# Patient Record
Sex: Female | Born: 1980 | Race: Black or African American | Hispanic: No | Marital: Married | Smoking: Never smoker
Health system: Southern US, Community
[De-identification: ages and names within clinical notes are randomized; demographics above are authoritative.]

## PROBLEM LIST (undated history)

## (undated) DIAGNOSIS — Z8759 Personal history of other complications of pregnancy, childbirth and the puerperium: Secondary | ICD-10-CM

## (undated) DIAGNOSIS — F32A Depression, unspecified: Secondary | ICD-10-CM

## (undated) DIAGNOSIS — D219 Benign neoplasm of connective and other soft tissue, unspecified: Secondary | ICD-10-CM

## (undated) DIAGNOSIS — G43909 Migraine, unspecified, not intractable, without status migrainosus: Secondary | ICD-10-CM

## (undated) DIAGNOSIS — I10 Essential (primary) hypertension: Secondary | ICD-10-CM

## (undated) DIAGNOSIS — Z98891 History of uterine scar from previous surgery: Secondary | ICD-10-CM

## (undated) DIAGNOSIS — F329 Major depressive disorder, single episode, unspecified: Secondary | ICD-10-CM

## (undated) DIAGNOSIS — F419 Anxiety disorder, unspecified: Secondary | ICD-10-CM

## (undated) DIAGNOSIS — O1490 Unspecified pre-eclampsia, unspecified trimester: Secondary | ICD-10-CM

## (undated) DIAGNOSIS — K8020 Calculus of gallbladder without cholecystitis without obstruction: Secondary | ICD-10-CM

## (undated) DIAGNOSIS — E559 Vitamin D deficiency, unspecified: Secondary | ICD-10-CM

## (undated) HISTORY — DX: Essential (primary) hypertension: I10

## (undated) HISTORY — PX: NASAL SEPTUM SURGERY: SHX37

## (undated) HISTORY — DX: Calculus of gallbladder without cholecystitis without obstruction: K80.20

## (undated) HISTORY — DX: Unspecified pre-eclampsia, unspecified trimester: O14.90

## (undated) HISTORY — DX: Vitamin D deficiency, unspecified: E55.9

## (undated) HISTORY — DX: Personal history of other complications of pregnancy, childbirth and the puerperium: Z87.59

---
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
## 2002-04-24 HISTORY — PX: OTHER SURGICAL HISTORY: SHX169

## 2003-03-25 HISTORY — PX: NASAL SEPTUM SURGERY: SHX37

## 2003-11-12 ENCOUNTER — Other Ambulatory Visit: Admission: RE | Admit: 2003-11-12 | Discharge: 2003-11-12 | Payer: Self-pay | Admitting: Obstetrics and Gynecology

## 2004-10-05 ENCOUNTER — Ambulatory Visit: Payer: Self-pay | Admitting: Gastroenterology

## 2005-01-24 ENCOUNTER — Other Ambulatory Visit: Admission: RE | Admit: 2005-01-24 | Discharge: 2005-01-24 | Payer: Self-pay | Admitting: Obstetrics and Gynecology

## 2005-11-24 ENCOUNTER — Emergency Department (HOSPITAL_COMMUNITY): Admission: EM | Admit: 2005-11-24 | Discharge: 2005-11-24 | Payer: Self-pay | Admitting: Family Medicine

## 2007-08-20 ENCOUNTER — Encounter: Admission: RE | Admit: 2007-08-20 | Discharge: 2007-08-20 | Payer: Self-pay | Admitting: Family Medicine

## 2012-11-26 LAB — OB RESULTS CONSOLE GC/CHLAMYDIA
Chlamydia: NEGATIVE
Gonorrhea: NEGATIVE

## 2012-11-26 LAB — OB RESULTS CONSOLE RUBELLA ANTIBODY, IGM: Rubella: IMMUNE

## 2012-11-26 LAB — OB RESULTS CONSOLE ABO/RH: RH Type: POSITIVE

## 2012-11-26 LAB — OB RESULTS CONSOLE ANTIBODY SCREEN: Antibody Screen: NEGATIVE

## 2012-11-26 LAB — OB RESULTS CONSOLE HIV ANTIBODY (ROUTINE TESTING): HIV: NONREACTIVE

## 2012-11-26 LAB — OB RESULTS CONSOLE RPR: RPR: NONREACTIVE

## 2012-11-26 LAB — OB RESULTS CONSOLE HEPATITIS B SURFACE ANTIGEN: Hepatitis B Surface Ag: NEGATIVE

## 2013-04-24 DIAGNOSIS — I10 Essential (primary) hypertension: Secondary | ICD-10-CM

## 2013-04-24 HISTORY — DX: Essential (primary) hypertension: I10

## 2013-04-24 NOTE — L&D Delivery Note (Signed)
Delivery Note Pt reached complete dilation and pushed great for 45 minutes.  At 8:13 PM a healthy female was delivered via Vaginal, Spontaneous Delivery (Presentation: Right Occiput Anterior).  APGAR: 9, 9; weight pending .   Placenta status: Intact, Spontaneous.  Cord: 3 vessels with the following complications: None.    Anesthesia: Epidural  Episiotomy: None Lacerations: First degree vaginal Suture Repair: 3.0 vicryl Est. Blood Loss (mL): 400cc  Mom to postpartum.  Baby to Couplet care / Skin to Skin. D/w pt and husband circumcision and they desire to proceed. Will watch pt BP, elevated with pushing to 170's/90-100 and if stay at that level will plan for magnesium postpartum.  Logan Bores 05/29/2013, 8:46 PM

## 2013-05-15 LAB — OB RESULTS CONSOLE GBS: GBS: NEGATIVE

## 2013-05-26 ENCOUNTER — Telehealth (HOSPITAL_COMMUNITY): Payer: Self-pay | Admitting: *Deleted

## 2013-05-26 ENCOUNTER — Encounter (HOSPITAL_COMMUNITY): Payer: Self-pay | Admitting: *Deleted

## 2013-05-26 NOTE — Telephone Encounter (Signed)
Preadmission screen  

## 2013-05-28 ENCOUNTER — Encounter (HOSPITAL_COMMUNITY): Payer: Self-pay

## 2013-05-28 ENCOUNTER — Inpatient Hospital Stay (HOSPITAL_COMMUNITY)
Admission: RE | Admit: 2013-05-28 | Discharge: 2013-06-01 | DRG: 774 | Disposition: A | Discharge status: Home / Self Care | Payer: No Typology Code available for payment source | Source: Ambulatory Visit | Attending: Obstetrics and Gynecology | Admitting: Obstetrics and Gynecology

## 2013-05-28 DIAGNOSIS — O99214 Obesity complicating childbirth: Secondary | ICD-10-CM

## 2013-05-28 DIAGNOSIS — IMO0002 Reserved for concepts with insufficient information to code with codable children: Principal | ICD-10-CM | POA: Diagnosis present

## 2013-05-28 DIAGNOSIS — Z6841 Body Mass Index (BMI) 40.0 and over, adult: Secondary | ICD-10-CM

## 2013-05-28 DIAGNOSIS — E669 Obesity, unspecified: Secondary | ICD-10-CM | POA: Diagnosis present

## 2013-05-28 DIAGNOSIS — O1490 Unspecified pre-eclampsia, unspecified trimester: Secondary | ICD-10-CM

## 2013-05-28 HISTORY — DX: Migraine, unspecified, not intractable, without status migrainosus: G43.909

## 2013-05-28 HISTORY — DX: Unspecified pre-eclampsia, unspecified trimester: O14.90

## 2013-05-28 LAB — URINALYSIS, ROUTINE W REFLEX MICROSCOPIC
Bilirubin Urine: NEGATIVE
Glucose, UA: NEGATIVE mg/dL
Ketones, ur: NEGATIVE mg/dL
Leukocytes, UA: NEGATIVE
Nitrite: NEGATIVE
PH: 6.5 (ref 5.0–8.0)
Protein, ur: >300 mg/dL — AB
SPECIFIC GRAVITY, URINE: 1.02 (ref 1.005–1.030)
UROBILINOGEN UA: 0.2 mg/dL (ref 0.0–1.0)

## 2013-05-28 LAB — CBC
HCT: 33.8 % — ABNORMAL LOW (ref 36.0–46.0)
Hemoglobin: 11.7 g/dL — ABNORMAL LOW (ref 12.0–15.0)
MCH: 30.4 pg (ref 26.0–34.0)
MCHC: 34.6 g/dL (ref 30.0–36.0)
MCV: 87.8 fL (ref 78.0–100.0)
PLATELETS: 146 10*3/uL — AB (ref 150–400)
RBC: 3.85 MIL/uL — AB (ref 3.87–5.11)
RDW: 13.6 % (ref 11.5–15.5)
WBC: 11.3 10*3/uL — AB (ref 4.0–10.5)

## 2013-05-28 LAB — COMPREHENSIVE METABOLIC PANEL
ALBUMIN: 2.5 g/dL — AB (ref 3.5–5.2)
ALK PHOS: 85 U/L (ref 39–117)
ALT: 22 U/L (ref 0–35)
AST: 22 U/L (ref 0–37)
BUN: 7 mg/dL (ref 6–23)
CO2: 21 mEq/L (ref 19–32)
Calcium: 8.9 mg/dL (ref 8.4–10.5)
Chloride: 105 mEq/L (ref 96–112)
Creatinine, Ser: 0.61 mg/dL (ref 0.50–1.10)
GFR calc Af Amer: >90 mL/min (ref >90)
GFR calc non Af Amer: >90 mL/min (ref >90)
Glucose, Bld: 107 mg/dL — ABNORMAL HIGH (ref 70–99)
POTASSIUM: 4 meq/L (ref 3.7–5.3)
SODIUM: 139 meq/L (ref 137–147)
Total Protein: 5.8 g/dL — ABNORMAL LOW (ref 6.0–8.3)

## 2013-05-28 LAB — URIC ACID: Uric Acid, Serum: 6.1 mg/dL (ref 2.4–7.0)

## 2013-05-28 LAB — URINE MICROSCOPIC-ADD ON

## 2013-05-28 MED ORDER — TERBUTALINE SULFATE 1 MG/ML IJ SOLN: 0.2500 mg | Freq: Once | INTRAMUSCULAR | Status: AC | PRN

## 2013-05-28 MED ORDER — OXYTOCIN 40 UNITS IN LACTATED RINGERS INFUSION - SIMPLE MED
1.0000 m[IU]/min | INTRAVENOUS | Status: DC
  Administered 2013-05-29: 3 m[IU]/min via INTRAVENOUS
  Administered 2013-05-29: 4 m[IU]/min via INTRAVENOUS
  Administered 2013-05-29: 2 m[IU]/min via INTRAVENOUS
  Filled 2013-05-28: qty 1000

## 2013-05-28 MED ORDER — ACETAMINOPHEN 325 MG PO TABS: 650.0000 mg | ORAL_TABLET | ORAL | Status: DC | PRN

## 2013-05-28 MED ORDER — LACTATED RINGERS IV SOLN
INTRAVENOUS | Status: DC
  Administered 2013-05-28 – 2013-05-29 (×2): via INTRAVENOUS

## 2013-05-28 MED ORDER — ZOLPIDEM TARTRATE 5 MG PO TABS
5.0000 mg | ORAL_TABLET | Freq: Every evening | ORAL | Status: DC | PRN
  Administered 2013-05-28: 5 mg via ORAL
  Filled 2013-05-28: qty 1

## 2013-05-28 MED ORDER — IBUPROFEN 600 MG PO TABS
600.0000 mg | ORAL_TABLET | Freq: Four times a day (QID) | ORAL | Status: DC | PRN
  Administered 2013-05-29: 600 mg via ORAL
  Filled 2013-05-28: qty 1

## 2013-05-28 MED ORDER — CITRIC ACID-SODIUM CITRATE 334-500 MG/5ML PO SOLN: 30.0000 mL | ORAL | Status: DC | PRN

## 2013-05-28 MED ORDER — ONDANSETRON HCL 4 MG/2ML IJ SOLN: 4.0000 mg | Freq: Four times a day (QID) | INTRAMUSCULAR | Status: DC | PRN

## 2013-05-28 MED ORDER — MISOPROSTOL 25 MCG QUARTER TABLET
25.0000 ug | ORAL_TABLET | ORAL | Status: DC | PRN
  Administered 2013-05-28 – 2013-05-29 (×2): 25 ug via VAGINAL
  Filled 2013-05-28 (×2): qty 0.25

## 2013-05-28 MED ORDER — OXYTOCIN 40 UNITS IN LACTATED RINGERS INFUSION - SIMPLE MED: 62.5000 mL/h | INTRAVENOUS | Status: DC

## 2013-05-28 MED ORDER — OXYCODONE-ACETAMINOPHEN 5-325 MG PO TABS: 1.0000 | ORAL_TABLET | ORAL | Status: DC | PRN

## 2013-05-28 MED ORDER — BUTORPHANOL TARTRATE 1 MG/ML IJ SOLN
1.0000 mg | INTRAMUSCULAR | Status: DC | PRN
  Administered 2013-05-29 (×3): 1 mg via INTRAVENOUS
  Filled 2013-05-28 (×3): qty 1

## 2013-05-28 MED ORDER — LACTATED RINGERS IV SOLN: 500.0000 mL | INTRAVENOUS | Status: DC | PRN

## 2013-05-28 MED ORDER — OXYTOCIN BOLUS FROM INFUSION
500.0000 mL | INTRAVENOUS | Status: DC
  Administered 2013-05-29: 500 mL via INTRAVENOUS

## 2013-05-28 MED ORDER — LIDOCAINE HCL (PF) 1 % IJ SOLN
30.0000 mL | INTRAMUSCULAR | Status: DC | PRN
  Filled 2013-05-28: qty 30

## 2013-05-28 NOTE — H&P (Signed)
Kimberly Howard is a 33 y.o. female G2P1001 at 39 weeks (EDD 06/18/13 by a 10 week [place]) presenting for induction due to preeclampsia.  The pt has had increasing BP for several weeks running 140-150/90's and recent proteinuria with a 24 hour urine returning with 911mg  of protein in 24 hours.  PIH labs are WNL.  She has not had any HA or abdominal pain, just feeling tired and weak.  Prenatal care has been otherwise uneventful.   Maternal Medical History:  Contractions: Onset was 3-5 hours ago.   Frequency: irregular.   Perceived severity is mild.    Fetal activity: Perceived fetal activity is normal.    Prenatal complications: PIH.   Prenatal Complications - Diabetes: none.    OB History   Grav Para Term Preterm Abortions TAB SAB Ect Mult Living   2    1  1        SAB x 1   No past medical history on file. Past Surgical History  Procedure Laterality Date  . Nasal septum surgery     Family History: family history is not on file. Social History:  has no tobacco, alcohol, and drug history on file.   Prenatal Transfer Tool  Maternal Diabetes: No Genetic Screening: Normal Maternal Ultrasounds/Referrals: Normal Fetal Ultrasounds or other Referrals:  None Maternal Substance Abuse:  No Significant Maternal Medications:  None Significant Maternal Lab Results:  None Other Comments:  None  Review of Systems  Gastrointestinal: Negative for abdominal pain.  Neurological: Negative for headaches.      Last menstrual period 09/19/2012. Exam Physical Exam  Constitutional: She is oriented to person, place, and time. She appears well-developed and well-nourished.  Cardiovascular: Normal rate and regular rhythm.   Respiratory: Effort normal.  GI: Soft.  Genitourinary: Vagina normal and uterus normal.  Musculoskeletal: She exhibits edema.  Neurological: She is alert and oriented to person, place, and time.  Psychiatric: She has a normal mood and affect.    Prenatal labs: ABO, Rh:  A/Positive/-- (08/05 0000) Antibody: Negative (08/05 0000) Rubella: Immune (08/05 0000) RPR: Nonreactive (08/05 0000)  HBsAg: Negative (08/05 0000)  HIV: Non-reactive (08/05 0000)  GBS: Negative (01/22 0000)  One hour GTT 90 First trimester screen negative Hgb AA  Assessment/Plan: Pt for cervical ripening and IOL for preeclampsia.  She has no severe criteria currently, so will hold off on magnesium unless admission BP or labs warrant that.  Will try cytotec ripening followed by Pitocin.   Logan Bores 05/28/2013, 6:14 PM

## 2013-05-29 ENCOUNTER — Inpatient Hospital Stay (HOSPITAL_COMMUNITY): Payer: No Typology Code available for payment source | Admitting: Anesthesiology

## 2013-05-29 ENCOUNTER — Encounter (HOSPITAL_COMMUNITY): Payer: Self-pay

## 2013-05-29 ENCOUNTER — Encounter (HOSPITAL_COMMUNITY): Payer: No Typology Code available for payment source | Admitting: Anesthesiology

## 2013-05-29 LAB — CBC
HCT: 35.3 % — ABNORMAL LOW (ref 36.0–46.0)
HCT: 32.8 % — ABNORMAL LOW (ref 36.0–46.0)
HEMOGLOBIN: 12.3 g/dL (ref 12.0–15.0)
Hemoglobin: 11.4 g/dL — ABNORMAL LOW (ref 12.0–15.0)
MCH: 30.8 pg (ref 26.0–34.0)
MCH: 31.2 pg (ref 26.0–34.0)
MCHC: 34.8 g/dL (ref 30.0–36.0)
MCHC: 34.8 g/dL (ref 30.0–36.0)
MCV: 88.5 fL (ref 78.0–100.0)
MCV: 89.9 fL (ref 78.0–100.0)
PLATELETS: 141 10*3/uL — AB (ref 150–400)
Platelets: 147 10*3/uL — ABNORMAL LOW (ref 150–400)
RBC: 3.99 MIL/uL (ref 3.87–5.11)
RBC: 3.65 MIL/uL — ABNORMAL LOW (ref 3.87–5.11)
RDW: 13.8 % (ref 11.5–15.5)
RDW: 13.9 % (ref 11.5–15.5)
WBC: 12.8 10*3/uL — ABNORMAL HIGH (ref 4.0–10.5)
WBC: 19.7 10*3/uL — ABNORMAL HIGH (ref 4.0–10.5)

## 2013-05-29 LAB — TYPE AND SCREEN
ABO/RH(D): A POS
ANTIBODY SCREEN: NEGATIVE

## 2013-05-29 LAB — RPR: RPR: NONREACTIVE

## 2013-05-29 LAB — ABO/RH: ABO/RH(D): A POS

## 2013-05-29 MED ORDER — MAGNESIUM SULFATE 40 G IN LACTATED RINGERS - SIMPLE
2.0000 g/h | INTRAVENOUS | Status: DC
  Filled 2013-05-29: qty 500

## 2013-05-29 MED ORDER — TETANUS-DIPHTH-ACELL PERTUSSIS 5-2.5-18.5 LF-MCG/0.5 IM SUSP
0.5000 mL | Freq: Once | INTRAMUSCULAR | Status: DC
  Filled 2013-05-29: qty 0.5

## 2013-05-29 MED ORDER — FENTANYL 2.5 MCG/ML BUPIVACAINE 1/10 % EPIDURAL INFUSION (WH - ANES)
INTRAMUSCULAR | Status: AC
  Administered 2013-05-29: 14 mL/h via EPIDURAL
  Filled 2013-05-29: qty 125

## 2013-05-29 MED ORDER — SODIUM CHLORIDE 0.45 % IV SOLN
INTRAVENOUS | Status: DC
  Administered 2013-05-30: 04:00:00 via INTRAVENOUS

## 2013-05-29 MED ORDER — DIPHENHYDRAMINE HCL 50 MG/ML IJ SOLN: 12.5000 mg | INTRAMUSCULAR | Status: DC | PRN

## 2013-05-29 MED ORDER — ONDANSETRON HCL 4 MG PO TABS: 4.0000 mg | ORAL_TABLET | ORAL | Status: DC | PRN

## 2013-05-29 MED ORDER — PRENATAL MULTIVITAMIN CH
1.0000 | ORAL_TABLET | Freq: Every day | ORAL | Status: DC
  Administered 2013-05-30 – 2013-05-31 (×2): 1 via ORAL
  Filled 2013-05-29 (×2): qty 1

## 2013-05-29 MED ORDER — OXYCODONE-ACETAMINOPHEN 5-325 MG PO TABS
1.0000 | ORAL_TABLET | ORAL | Status: DC | PRN
  Administered 2013-05-30: 1 via ORAL
  Administered 2013-05-30: 2 via ORAL
  Administered 2013-05-31: 1 via ORAL
  Administered 2013-05-31: 2 via ORAL
  Filled 2013-05-29: qty 1
  Filled 2013-05-29: qty 2
  Filled 2013-05-29 (×2): qty 1
  Filled 2013-05-29: qty 2

## 2013-05-29 MED ORDER — IBUPROFEN 600 MG PO TABS
600.0000 mg | ORAL_TABLET | Freq: Four times a day (QID) | ORAL | Status: DC
  Administered 2013-05-30 – 2013-06-01 (×8): 600 mg via ORAL
  Filled 2013-05-29 (×8): qty 1

## 2013-05-29 MED ORDER — DIBUCAINE 1 % RE OINT
1.0000 "application " | TOPICAL_OINTMENT | RECTAL | Status: DC | PRN
  Filled 2013-05-29: qty 28

## 2013-05-29 MED ORDER — PHENYLEPHRINE 40 MCG/ML (10ML) SYRINGE FOR IV PUSH (FOR BLOOD PRESSURE SUPPORT): 80.0000 ug | PREFILLED_SYRINGE | INTRAVENOUS | Status: DC | PRN

## 2013-05-29 MED ORDER — WITCH HAZEL-GLYCERIN EX PADS: 1.0000 "application " | MEDICATED_PAD | CUTANEOUS | Status: DC | PRN

## 2013-05-29 MED ORDER — ONDANSETRON HCL 4 MG/2ML IJ SOLN: 4.0000 mg | INTRAMUSCULAR | Status: DC | PRN

## 2013-05-29 MED ORDER — PHENYLEPHRINE 40 MCG/ML (10ML) SYRINGE FOR IV PUSH (FOR BLOOD PRESSURE SUPPORT)
PREFILLED_SYRINGE | INTRAVENOUS | Status: AC
  Filled 2013-05-29: qty 10

## 2013-05-29 MED ORDER — EPHEDRINE 5 MG/ML INJ: 10.0000 mg | INTRAVENOUS | Status: DC | PRN

## 2013-05-29 MED ORDER — BUPIVACAINE HCL (PF) 0.25 % IJ SOLN
INTRAMUSCULAR | Status: DC | PRN
  Administered 2013-05-29: 10 mL

## 2013-05-29 MED ORDER — SIMETHICONE 80 MG PO CHEW: 80.0000 mg | CHEWABLE_TABLET | ORAL | Status: DC | PRN

## 2013-05-29 MED ORDER — DIPHENHYDRAMINE HCL 25 MG PO CAPS: 25.0000 mg | ORAL_CAPSULE | Freq: Four times a day (QID) | ORAL | Status: DC | PRN

## 2013-05-29 MED ORDER — SENNOSIDES-DOCUSATE SODIUM 8.6-50 MG PO TABS
2.0000 | ORAL_TABLET | ORAL | Status: DC
  Administered 2013-05-30 – 2013-05-31 (×3): 2 via ORAL
  Filled 2013-05-29 (×3): qty 2

## 2013-05-29 MED ORDER — BENZOCAINE-MENTHOL 20-0.5 % EX AERO
1.0000 "application " | INHALATION_SPRAY | CUTANEOUS | Status: DC | PRN
  Administered 2013-06-01: 1 via TOPICAL
  Filled 2013-05-29 (×2): qty 56

## 2013-05-29 MED ORDER — ZOLPIDEM TARTRATE 5 MG PO TABS: 5.0000 mg | ORAL_TABLET | Freq: Every evening | ORAL | Status: DC | PRN

## 2013-05-29 MED ORDER — MAGNESIUM SULFATE BOLUS VIA INFUSION
4.0000 g | Freq: Once | INTRAVENOUS | Status: AC
  Administered 2013-05-29: 4 g via INTRAVENOUS
  Filled 2013-05-29: qty 500

## 2013-05-29 MED ORDER — LANOLIN HYDROUS EX OINT: TOPICAL_OINTMENT | CUTANEOUS | Status: DC | PRN

## 2013-05-29 MED ORDER — LACTATED RINGERS IV SOLN: 500.0000 mL | Freq: Once | INTRAVENOUS | Status: DC

## 2013-05-29 MED ORDER — EPHEDRINE 5 MG/ML INJ
INTRAVENOUS | Status: AC
  Filled 2013-05-29: qty 4

## 2013-05-29 MED ORDER — FENTANYL 2.5 MCG/ML BUPIVACAINE 1/10 % EPIDURAL INFUSION (WH - ANES)
14.0000 mL/h | INTRAMUSCULAR | Status: DC | PRN
  Administered 2013-05-29: 14 mL/h via EPIDURAL
  Filled 2013-05-29: qty 125

## 2013-05-29 MED ORDER — LIDOCAINE HCL (PF) 1 % IJ SOLN
INTRAMUSCULAR | Status: DC | PRN
  Administered 2013-05-29 (×4): 4 mL

## 2013-05-29 NOTE — Anesthesia Procedure Notes (Signed)
Epidural Patient location during procedure: OB Start time: 05/29/2013 9:49 AM  Staffing Performed by: anesthesiologist   Preanesthetic Checklist Completed: patient identified, site marked, surgical consent, pre-op evaluation, timeout performed, IV checked, risks and benefits discussed and monitors and equipment checked  Epidural Patient position: sitting Prep: site prepped and draped and DuraPrep Patient monitoring: continuous pulse ox and blood pressure Approach: midline Injection technique: LOR air  Needle:  Needle type: Tuohy  Needle gauge: 17 G Needle length: 9 cm and 9 Needle insertion depth: 5.5 cm Catheter type: closed end flexible Catheter size: 19 Gauge Catheter at skin depth: 10.5 cm Test dose: negative  Assessment Events: blood not aspirated, injection not painful, no injection resistance, negative IV test and no paresthesia  Additional Notes Discussed risk of headache, infection, bleeding, nerve injury and failed or incomplete block.  Patient voices understanding and wishes to proceed.  Epidural placed easily on first attempt.  No paresthesia.  Patient tolerated procedure well with no apparent complications.  Charlton Haws, MDReason for block:procedure for pain

## 2013-05-29 NOTE — Progress Notes (Signed)
Patient ID: Kimberly Howard, female   DOB: 10-18-1980, 33 y.o.   MRN: [id number] Pt received 2 doses of stadol overnight and got uncomfortable enough for stadol FHR Category 2 BP running 150/90's and Labs WNL on admission except for 3+ proteinuria  Cervix 50/2/-2 posterior  Will repeat labs this AM, epidural when more active No other severe criteria except the protein, so have not put pt on Magnesium--if BP worsen will start Magnesium

## 2013-05-29 NOTE — Anesthesia Preprocedure Evaluation (Addendum)
Anesthesia Evaluation  Patient identified by MRN, date of birth, ID band Patient awake    Reviewed: Allergy & Precautions, H&P , Patient's Chart, lab work & pertinent test results  Airway Mallampati: II TM Distance: >3 FB Neck ROM: full    Dental   Pulmonary  breath sounds clear to auscultation        Cardiovascular hypertension (preeclapmsia), Rhythm:regular Rate:Normal     Neuro/Psych  Headaches,    GI/Hepatic   Endo/Other  Morbid obesity  Renal/GU      Musculoskeletal   Abdominal   Peds  Hematology   Anesthesia Other Findings   Reproductive/Obstetrics (+) Pregnancy                          Anesthesia Physical Anesthesia Plan  ASA: III  Anesthesia Plan: Epidural   Post-op Pain Management:    Induction:   Airway Management Planned:   Additional Equipment:   Intra-op Plan:   Post-operative Plan:   Informed Consent: I have reviewed the patients History and Physical, chart, labs and discussed the procedure including the risks, benefits and alternatives for the proposed anesthesia with the patient or authorized representative who has indicated his/her understanding and acceptance.     Plan Discussed with:   Anesthesia Plan Comments:         Anesthesia Quick Evaluation

## 2013-05-29 NOTE — Progress Notes (Signed)
   Subjective: Pt comfortable with epidural  Objective: BP 141/93  Pulse 81  Temp(Src) 98.1 F (36.7 C) (Oral)  Resp 20  Ht 5\' 4"  (1.626 m)  Wt 106.142 kg (234 lb)  BMI 40.15 kg/m2  SpO2 99%  LMP 09/19/2012      FHT:  Category 1 UC:   regular, every 3-4 minutes SVE:   Dilation: 5.5 Effacement (%): 100 Station: -1 Exam by:: Jule Economy RNC  Labs: Lab Results  Component Value Date   WBC 12.8* 05/29/2013   HGB 12.3 05/29/2013   HCT 35.3* 05/29/2013   MCV 88.5 05/29/2013   PLT 147* 05/29/2013    Assessment / Plan: IOL for preeclampsia, good progress.  Continue to follow.  BP stable  Erbie Arment W 05/29/2013, 2:26 PM

## 2013-05-29 NOTE — Progress Notes (Signed)
Patient ID: Kimberly Howard, female   DOB: 06/22/1980, 33 y.o.   MRN: [id number] Pt feeling some abdominal pain with ctx, no pressure yet afeb vss C/9/0 FHR reassuring  Category 1  Will boost epidural and recheck in 1 hour.

## 2013-05-30 LAB — CBC
HCT: 29.6 % — ABNORMAL LOW (ref 36.0–46.0)
Hemoglobin: 10.2 g/dL — ABNORMAL LOW (ref 12.0–15.0)
MCH: 30.4 pg (ref 26.0–34.0)
MCHC: 34.5 g/dL (ref 30.0–36.0)
MCV: 88.4 fL (ref 78.0–100.0)
PLATELETS: 135 10*3/uL — AB (ref 150–400)
RBC: 3.35 MIL/uL — ABNORMAL LOW (ref 3.87–5.11)
RDW: 13.7 % (ref 11.5–15.5)
WBC: 20.9 10*3/uL — ABNORMAL HIGH (ref 4.0–10.5)

## 2013-05-30 LAB — COMPREHENSIVE METABOLIC PANEL
ALT: 17 U/L (ref 0–35)
AST: 22 U/L (ref 0–37)
Albumin: 2.1 g/dL — ABNORMAL LOW (ref 3.5–5.2)
Alkaline Phosphatase: 76 U/L (ref 39–117)
BUN: 7 mg/dL (ref 6–23)
CALCIUM: 8.4 mg/dL (ref 8.4–10.5)
CO2: 21 mEq/L (ref 19–32)
Chloride: 105 mEq/L (ref 96–112)
Creatinine, Ser: 0.69 mg/dL (ref 0.50–1.10)
GFR calc Af Amer: >90 mL/min (ref >90)
Glucose, Bld: 104 mg/dL — ABNORMAL HIGH (ref 70–99)
Potassium: 3.9 mEq/L (ref 3.7–5.3)
Sodium: 137 mEq/L (ref 137–147)
Total Bilirubin: 0.2 mg/dL — ABNORMAL LOW (ref 0.3–1.2)
Total Protein: 4.9 g/dL — ABNORMAL LOW (ref 6.0–8.3)

## 2013-05-30 MED ORDER — MAGNESIUM SULFATE 40 G IN LACTATED RINGERS - SIMPLE
2.0000 g/h | INTRAVENOUS | Status: DC
  Administered 2013-05-30: 2 g/h via INTRAVENOUS
  Filled 2013-05-30: qty 500

## 2013-05-30 MED ORDER — MAGNESIUM SULFATE 40 G IN LACTATED RINGERS - SIMPLE
2.0000 g/h | INTRAVENOUS | Status: DC
Start: 2013-05-30 — End: 2013-05-30

## 2013-05-30 MED ORDER — SODIUM CHLORIDE 0.45 % IV SOLN
INTRAVENOUS | Status: DC
  Administered 2013-05-30 (×2): via INTRAVENOUS

## 2013-05-30 NOTE — Progress Notes (Signed)
Patient ID: Kimberly Howard, female   DOB: [birth year], 34 y.o.   MRN: [id number] PPD #1 preeclampsia/NSVD  Pt feels well with no symptoms.  Nml lochia  BP 140-170/80-90 Great Urine output overnight Gravid NT  Labs WNL this Am Plts 137K  Pt s/p NSVD, preeclampsia on magnesium Pt with excellent diuresis, nml labs BP still elevated, but not high enough for medicating  Will d/c magnesium this PM as long as continues good diuresis throughout day Watch BP and add meds as needed.

## 2013-05-30 NOTE — Lactation Note (Signed)
This note was copied from the chart of [place]. Lactation Consultation Note     Follow up consult with this mom and baby, now 22 hours post partum. Mom is on magnesium drip, in antenatal, with persistent hypertension. The baby is now 71 2/7 weeks corrected gestation, and is not sucking at the breast. Mom and dad have been cup feeding formula today, and mom stated she would prefer bottle feeding. I got bottles and slow flow nipples for the baby. I also gave them the [facility] formula amounts to feed chart, so they know how much to increase his feedings. I started mom pumping with DEP in premie setting, and sowed her how to hand express. I told mom her goal is to pump every 3 hours, in premie setting, followed by hand expression, but Iwas more concerned about her health and hypertension. I told her ot sleep when she wanted to, and do the best she can with pumping. Mom has no colostrum at this time, probably due to her hypertension. Mom knows to call for questions/concerns.   Patient Name: Kimberly Howard CLEXN'T Date: 05/30/2013 Reason for consult: Follow-up assessment;Late preterm infant   Maternal Data    Feeding    LATCH Score/Interventions                      Lactation Tools Discussed/Used Pump Review: Setup, frequency, and cleaning;Milk Storage;Other (comment) (premie setting, hand expression) Initiated by:: clee rn at 22 hours post partum - baby not latching and has been cup fed formula. Mom and dad are now  Date initiated:: 05/30/13   Consult Status Consult Status: Follow-up Date: 05/31/13 Follow-up type: In-patient    Tonna Corner 05/30/2013, 7:04 PM

## 2013-05-30 NOTE — Anesthesia Postprocedure Evaluation (Signed)
Anesthesia Post Note  Patient: Kimberly Howard  Procedure(s) Performed: * No procedures listed *  Anesthesia type: Epidural  Patient location: Mother/Baby  Post pain: Pain level controlled  Post assessment: Post-op Vital signs reviewed  Last Vitals:  Filed Vitals:   05/30/13 0715  BP: 157/95  Pulse: 109  Temp:   Resp: 20    Post vital signs: Reviewed  Level of consciousness:alert  Complications: No apparent anesthesia complications

## 2013-05-31 LAB — COMPREHENSIVE METABOLIC PANEL
ALK PHOS: 63 U/L (ref 39–117)
ALT: 16 U/L (ref 0–35)
AST: 25 U/L (ref 0–37)
Albumin: 2.1 g/dL — ABNORMAL LOW (ref 3.5–5.2)
BILIRUBIN TOTAL: 0.2 mg/dL — AB (ref 0.3–1.2)
BUN: 5 mg/dL — ABNORMAL LOW (ref 6–23)
CHLORIDE: 105 meq/L (ref 96–112)
CO2: 23 meq/L (ref 19–32)
Calcium: 7.9 mg/dL — ABNORMAL LOW (ref 8.4–10.5)
Creatinine, Ser: 0.65 mg/dL (ref 0.50–1.10)
GLUCOSE: 96 mg/dL (ref 70–99)
Potassium: 3.6 mEq/L — ABNORMAL LOW (ref 3.7–5.3)
SODIUM: 139 meq/L (ref 137–147)
Total Protein: 5.2 g/dL — ABNORMAL LOW (ref 6.0–8.3)

## 2013-05-31 LAB — CBC
HCT: 26.1 % — ABNORMAL LOW (ref 36.0–46.0)
HEMOGLOBIN: 9.1 g/dL — AB (ref 12.0–15.0)
MCH: 31.1 pg (ref 26.0–34.0)
MCHC: 34.9 g/dL (ref 30.0–36.0)
MCV: 89.1 fL (ref 78.0–100.0)
Platelets: 120 10*3/uL — ABNORMAL LOW (ref 150–400)
RBC: 2.93 MIL/uL — AB (ref 3.87–5.11)
RDW: 14.1 % (ref 11.5–15.5)
WBC: 15.6 10*3/uL — ABNORMAL HIGH (ref 4.0–10.5)

## 2013-05-31 MED ORDER — NIFEDIPINE ER 30 MG PO TB24
30.0000 mg | ORAL_TABLET | Freq: Every day | ORAL | Status: DC
  Administered 2013-05-31: 30 mg via ORAL
  Filled 2013-05-31 (×2): qty 1

## 2013-05-31 NOTE — Progress Notes (Signed)
Patient ID: Kimberly Howard, female   DOB: [birth year], 33 y.o.   MRN: [id number] #2 afebrile BP still a little high Platelet count 120 K LFT's normal Will d.c magnesium and begin procardia

## 2013-05-31 NOTE — Lactation Note (Signed)
This note was copied from the chart of [name]. Lactation Consultation Note  Patient Name: Kimberly Howard [id number] Date: 05/31/2013 Reason for consult: Follow-up assessment Mom has been giving all bottles and reports she is probably going to formula and bottle feed. Asked Mom if she wanted to pump and bottle to give EBM, she will consider. Advised Mom not to pump if she is not going to give EBM. Offered LC assistance to Mom if she decides to re-introduce the breast. She will advise.   Maternal Data    Feeding Feeding Type: Formula  LATCH Score/Interventions                      Lactation Tools Discussed/Used     Consult Status Consult Status: PRN Date: 06/01/13 Follow-up type: In-patient    Katrine Coho 05/31/2013, 7:00 PM

## 2013-06-01 LAB — COMPREHENSIVE METABOLIC PANEL
ALBUMIN: 2.2 g/dL — AB (ref 3.5–5.2)
ALT: 25 U/L (ref 0–35)
AST: 37 U/L (ref 0–37)
Alkaline Phosphatase: 86 U/L (ref 39–117)
BUN: 6 mg/dL (ref 6–23)
CALCIUM: 8.7 mg/dL (ref 8.4–10.5)
CHLORIDE: 105 meq/L (ref 96–112)
CO2: 25 mEq/L (ref 19–32)
CREATININE: 0.65 mg/dL (ref 0.50–1.10)
GFR calc Af Amer: >90 mL/min (ref >90)
GFR calc non Af Amer: >90 mL/min (ref >90)
Glucose, Bld: 85 mg/dL (ref 70–99)
Potassium: 4.1 mEq/L (ref 3.7–5.3)
Sodium: 140 mEq/L (ref 137–147)
TOTAL PROTEIN: 5.6 g/dL — AB (ref 6.0–8.3)
Total Bilirubin: 0.2 mg/dL — ABNORMAL LOW (ref 0.3–1.2)

## 2013-06-01 LAB — CBC
HEMATOCRIT: 27.4 % — AB (ref 36.0–46.0)
HEMOGLOBIN: 9.3 g/dL — AB (ref 12.0–15.0)
MCH: 31 pg (ref 26.0–34.0)
MCHC: 33.9 g/dL (ref 30.0–36.0)
MCV: 91.3 fL (ref 78.0–100.0)
Platelets: 146 10*3/uL — ABNORMAL LOW (ref 150–400)
RBC: 3 MIL/uL — ABNORMAL LOW (ref 3.87–5.11)
RDW: 14 % (ref 11.5–15.5)
WBC: 13.2 10*3/uL — AB (ref 4.0–10.5)

## 2013-06-01 MED ORDER — IBUPROFEN 600 MG PO TABS: 600.0000 mg | ORAL_TABLET | Freq: Four times a day (QID) | ORAL | Status: DC | PRN

## 2013-06-01 MED ORDER — NIFEDIPINE ER 60 MG PO TB24
60.0000 mg | ORAL_TABLET | Freq: Every day | ORAL | Status: DC
  Administered 2013-06-01: 60 mg via ORAL
  Filled 2013-06-01: qty 1

## 2013-06-01 MED ORDER — NIFEDIPINE ER 60 MG PO TB24: 60.0000 mg | ORAL_TABLET | Freq: Every day | ORAL | Status: DC

## 2013-06-01 MED ORDER — OXYCODONE-ACETAMINOPHEN 5-325 MG PO TABS: 1.0000 | ORAL_TABLET | Freq: Four times a day (QID) | ORAL | Status: DC | PRN

## 2013-06-01 NOTE — Progress Notes (Signed)
Patient ID: Kimberly Howard, female   DOB: 21-Feb-1981, 33 y.o.   MRN: [id number] #3 afebrile BP still intermittently elevated. Labs are OK Will d/c on procardia 60 mg qd

## 2013-06-01 NOTE — Discharge Instructions (Signed)
booklet °

## 2013-06-02 ENCOUNTER — Encounter (HOSPITAL_COMMUNITY): Payer: Self-pay

## 2013-06-02 NOTE — Discharge Summary (Signed)
Kimberly Howard, Kimberly Howard               ACCOUNT NO.:  [id number]  MEDICAL RECORD NO.:  [id number]  LOCATION:  7026                          FACILITY:  [facility]  PHYSICIAN:  Lucille Passy. Ulanda Edison, M.D. DATE OF BIRTH:  17-May-1980  DATE OF ADMISSION:  05/28/2013 DATE OF DISCHARGE:  06/01/2013                              DISCHARGE SUMMARY   HOSPITAL COURSE:  This is a 33 year old black female, para 1-0-0-1, gravida 2 at 96 weeks' gestation by 10-week ultrasound, presented for induction of labor due to preeclampsia.  The patient had increasing blood pressure for several weeks prior to delivery, recent onset of proteinuria with 24-hour urine returning as 911 mg of protein in 24 hours.  [facility] labs on the blood were normal.  She had not had a headache or abdominal pain.  She just felt tired and weak.  Blood group and type A positive, negative antibody.  All blood tests including first trimester screen were negative.  Group B strep was negative and 1-hour Glucola was 90.  The patient was admitted for cervical ripening.  She did receive an epidural.  Blood pressure was somewhat high.  She did have high blood pressure.  She reached full dilatation and pushed well, delivered a living infant at 8:13 p.m. on the 5th.  Blood pressures were in the 170s over 90 to 100 range and Dr. Marvel Plan placed her on postpartum magnesium sulfate.  The patient diuresed well.  She had normal labs.  Blood pressure was still elevated.  I discontinued the magnesium sulfate on May 31, 2013, at 8:21 a.m.  Platelet count was 120,000.  Liver tests were normal.  The patient was begun on Procardia. Blood pressures remained somewhat elevated.  Repeat panel of labs on 06/01/2013 showed the platelet count to be 146,000 up from 120,000.  The LFTs were normal even though slightly above where they had been.  AST was 37 and ALT 25.  I am going to raise the Procardia to 60 mg daily and have the patient take her blood pressure, and if it is  above 160/105 or if she has severe headache or severe epigastric pain, she has to call, otherwise to come back in 1 week.  FINAL DIAGNOSES:  Intrauterine pregnancy at 37 weeks, delivered vertex, preeclampsia.  OPERATION:  Spontaneous delivery vertex, repair of first-degree vaginal laceration with 3-0 Vicryl.  FINAL CONDITION:  Improved.  INSTRUCTIONS:  Our regular discharge instruction booklet as well as the after visit summary.  Percocet 5/325, 15 tablets 1 every 6 hours as needed for pain, Motrin 600 mg 30 tablets 1 every 6 hours as needed for pain, Procardia XL 60 mg 30 tablets 1 daily.  The patient is to continue her prenatal vitamins.  Return in 1 week for followup examination.     Lucille Passy. Ulanda Edison, M.D.     TFH/MEDQ  D:  06/01/2013  T:  06/02/2013  Job:  [id number]

## 2013-06-23 NOTE — Progress Notes (Signed)
Post discharge review completed. 

## 2013-12-21 ENCOUNTER — Ambulatory Visit (INDEPENDENT_AMBULATORY_CARE_PROVIDER_SITE_OTHER): Payer: No Typology Code available for payment source | Admitting: Family Medicine

## 2013-12-21 VITALS — BP 152/80 | HR 94 | Temp 98.4°F | Resp 15 | Ht 64.0 in | Wt 201.8 lb

## 2013-12-21 DIAGNOSIS — K089 Disorder of teeth and supporting structures, unspecified: Secondary | ICD-10-CM

## 2013-12-21 DIAGNOSIS — K0889 Other specified disorders of teeth and supporting structures: Secondary | ICD-10-CM

## 2013-12-21 MED ORDER — HYDROCODONE-ACETAMINOPHEN 7.5-325 MG PO TABS: 1.0000 | ORAL_TABLET | Freq: Four times a day (QID) | ORAL | Status: DC | PRN

## 2013-12-21 MED ORDER — OXYCODONE-ACETAMINOPHEN 7.5-325 MG PO TABS: 1.0000 | ORAL_TABLET | ORAL | Status: DC | PRN

## 2013-12-21 NOTE — Progress Notes (Signed)
Subjective:  This chart was scribed for Kimberly Haber, MD  by Forrestine Him, [facility] Scribe. This patient was seen in room 14 and the patient's care was started 11:08 AM.    Patient ID: Kimberly Howard, female    DOB: 07/26/1980, 33 y.o.   MRN: [id number]  HPI HPI Comments: [name] is a 33 y.o. female who presents to [facility] complaining of constant, moderate dental pain x 3 days that has progressively worsened. Pt attributes pain to two fractured teeth and recent softening of teeth after pregnancy. She admits to ongoing teeth sensitivity which is baseline for her. Pt was seen by her dentist recently and was given Amoxicillin and Ibuprofen to help manage symptoms. She has also tried OTC BC Powders/Orajel and prescribed Percocet without any improvement for symptoms. At this time she denies any fever or chills. No known allergies to medications. No other concerns this visit.  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 Authorizing Provider  amoxicillin (AMOXIL) 500 MG capsule Take 500 mg by mouth 3 (three) times daily.   Yes Historical Provider, MD  ibuprofen (ADVIL,MOTRIN) 600 MG tablet Take 1 tablet (600 mg total) by mouth every 6 (six) hours as needed. 06/01/13  Yes Melina Schools, MD  norethindrone-ethinyl estradiol-iron (MICROGESTIN FE,GILDESS FE,LOESTRIN FE) 1.5-30 MG-MCG tablet Take 1 tablet by mouth daily.   Yes Historical Provider, MD  oxyCODONE-acetaminophen (PERCOCET/ROXICET) 5-325 MG per tablet Take 1 tablet by mouth every 6 (six) hours as needed for severe pain (moderate - severe  pain). 06/01/13  Yes Melina Schools, MD  PARoxetine (PAXIL) 20 MG tablet Take 20 mg by mouth daily.   Yes Historical Provider, MD  topiramate (TOPAMAX) 100 MG tablet Take 100 mg by mouth 2 (two) times daily.   Yes Historical Provider, MD  triamterene-hydrochlorothiazide (DYAZIDE) 37.5-25 MG per capsule Take 1 capsule by mouth daily.   Yes Historical Provider, MD  NIFEdipine (PROCARDIA-XL/ADALAT CC) 60 MG 24 hr tablet Take 1 tablet (60 mg total) by mouth daily. 06/01/13   Melina Schools, MD  Prenatal Vit-Fe Fumarate-FA (PRENATAL MULTIVITAMIN) TABS tablet Take 1 tablet by mouth daily at 12 noon.    Historical Provider, MD    Review of Systems  Constitutional: Negative for fever and chills.  HENT: Positive for dental problem.   Respiratory: Negative for shortness of breath.     Triage Vitals: BP 152/80   Pulse 94   Temp(Src) 98.4 F (36.9 C) (Oral)   Resp 15   Ht 5\' 4"  (1.626 m)   Wt 201 lb 12.8 oz (91.536 kg)   BMI 34.62 kg/m2   SpO2 100%  Objective:  Physical Exam  Nursing note and vitals reviewed. Constitutional: She is oriented to person, place, and time. She appears well-developed and well-nourished.  HENT:  Head: Normocephalic.  Eyes: EOM are normal.  Neck: Normal range of motion.  Pulmonary/Chest: Effort normal.  Abdominal: She exhibits no distension.  Musculoskeletal: Normal range of motion.  Neurological: She is alert and oriented to person, place, and time.  Psychiatric: She has a normal mood and affect.   sensitive left molars upper and lower Patient has a white scaly eschar peeling from edema of her lower gum line adjacent to teeth 17 through 20  Assessment & Plan:  I personally performed the services described in this documentation, which was scribed in my presence. The recorded information has been reviewed and is accurate.  Pain, dental - Plan: oxyCODONE-acetaminophen (PERCOCET) 7.5-325 MG per tablet, DISCONTINUED: HYDROcodone-acetaminophen (NORCO) 7.5-325 MG per  tablet  Signed, Kimberly Haber, MD  Continue the amoxicillin

## 2013-12-21 NOTE — Patient Instructions (Signed)
Follow up with your dentist.

## 2014-07-03 ENCOUNTER — Ambulatory Visit (INDEPENDENT_AMBULATORY_CARE_PROVIDER_SITE_OTHER): Payer: No Typology Code available for payment source | Admitting: Physician Assistant

## 2014-07-03 ENCOUNTER — Encounter: Payer: Self-pay | Admitting: Physician Assistant

## 2014-07-03 VITALS — BP 120/78 | HR 80 | Temp 97.7°F | Resp 16 | Ht 64.0 in | Wt 202.0 lb

## 2014-07-03 DIAGNOSIS — G43909 Migraine, unspecified, not intractable, without status migrainosus: Secondary | ICD-10-CM | POA: Insufficient documentation

## 2014-07-03 DIAGNOSIS — E669 Obesity, unspecified: Secondary | ICD-10-CM

## 2014-07-03 DIAGNOSIS — F32A Depression, unspecified: Secondary | ICD-10-CM

## 2014-07-03 DIAGNOSIS — E559 Vitamin D deficiency, unspecified: Secondary | ICD-10-CM

## 2014-07-03 DIAGNOSIS — M2660 Temporomandibular joint disorder, unspecified: Secondary | ICD-10-CM

## 2014-07-03 DIAGNOSIS — I10 Essential (primary) hypertension: Secondary | ICD-10-CM

## 2014-07-03 DIAGNOSIS — Z79899 Other long term (current) drug therapy: Secondary | ICD-10-CM

## 2014-07-03 DIAGNOSIS — F329 Major depressive disorder, single episode, unspecified: Secondary | ICD-10-CM

## 2014-07-03 DIAGNOSIS — M26609 Unspecified temporomandibular joint disorder, unspecified side: Secondary | ICD-10-CM

## 2014-07-03 DIAGNOSIS — R5383 Other fatigue: Secondary | ICD-10-CM

## 2014-07-03 DIAGNOSIS — G43809 Other migraine, not intractable, without status migrainosus: Secondary | ICD-10-CM

## 2014-07-03 MED ORDER — VERAPAMIL HCL ER 180 MG PO TBCR: 180.0000 mg | EXTENDED_RELEASE_TABLET | Freq: Every day | ORAL | Status: DC

## 2014-07-03 MED ORDER — CITALOPRAM HYDROBROMIDE 20 MG PO TABS: 20.0000 mg | ORAL_TABLET | Freq: Every day | ORAL | Status: DC

## 2014-07-03 MED ORDER — TOPIRAMATE 50 MG PO TABS: 50.0000 mg | ORAL_TABLET | Freq: Two times a day (BID) | ORAL | Status: DC

## 2014-07-03 NOTE — Patient Instructions (Addendum)
Start the celexa 20mg  1/2 pill daily for 2-3 days, then go to one pill for 2 weeks.  Continue fluid pill for now during these two week.  Then stop the fluid pill and start the verapamil at night for BP for 2 weeks Then can start topamax after the verapamil.   What is the TMJ? The temporomandibular (tem-PUH-ro-man-DIB-yoo-ler) joint, or the TMJ, connects the upper and lower jawbones. This joint allows the jaw to open wide and move back and forth when you chew, talk, or yawn.There are also several muscles that help this joint move. There can be muscle tightness and pain in the muscle that can cause several symptoms.  What causes TMJ pain? There are many causes of TMJ pain. Repeated chewing (for example, chewing gum) and clenching your teeth can cause pain in the joint. Some TMJ pain has no obvious cause. What can I do to ease the pain? There are many things you can do to help your pain get better. When you have pain:  Eat soft foods and stay away from chewy foods (for example, taffy) Try to use both sides of your mouth to chew Don't chew gum Massage Don't open your mouth wide (for example, during yawning or singing) Don't bite your cheeks or fingernails Lower your amount of stress and worry Applying a warm, damp washcloth to the joint may help. Over-the-counter pain medicines such as ibuprofen (one brand: Advil) or acetaminophen (one brand: Tylenol) might also help. Do not use these medicines if you are allergic to them or if your doctor told you not to use them. How can I stop the pain from coming back? When your pain is better, you can do these exercises to make your muscles stronger and to keep the pain from coming back:  Resisted mouth opening: Place your thumb or two fingers under your chin and open your mouth slowly, pushing up lightly on your chin with your thumb. Hold for three to six seconds. Close your mouth slowly. Resisted mouth closing: Place your thumbs under your chin and your  two index fingers on the ridge between your mouth and the bottom of your chin. Push down lightly on your chin as you close your mouth. Tongue up: Slowly open and close your mouth while keeping the tongue touching the roof of the mouth. Side-to-side jaw movement: Place an object about one fourth of an inch thick (for example, two tongue depressors) between your front teeth. Slowly move your jaw from side to side. Increase the thickness of the object as the exercise becomes easier Forward jaw movement: Place an object about one fourth of an inch thick between your front teeth and move the bottom jaw forward so that the bottom teeth are in front of the top teeth. Increase the thickness of the object as the exercise becomes easier. These exercises should not be painful. If it hurts to do these exercises, stop doing them and talk to your family doctor.   Before you even begin to attack a weight-loss plan, it pays to remember this: You are not fat. You have fat. Losing weight isn't about blame or shame; it's simply another achievement to accomplish. Dieting is like any other skill-you have to buckle down and work at it. As long as you act in a smart, reasonable way, you'll ultimately get where you want to be. Here are some weight loss pearls for you.  1. It's Not a Diet. It's a Lifestyle Thinking of a diet as something you're on  and suffering through only for the short term doesn't work. To shed weight and keep it off, you need to make permanent changes to the way you eat. It's OK to indulge occasionally, of course, but if you cut calories temporarily and then revert to your old way of eating, you'll gain back the weight quicker than you can say yo-yo. Use it to lose it. Research shows that one of the best predictors of long-term weight loss is how many pounds you drop in the first month. For that reason, nutritionists often suggest being stricter for the first two weeks of your new eating strategy to build  momentum. Cut out added sugar and alcohol and avoid unrefined carbs. After that, figure out how you can reincorporate them in a way that's healthy and maintainable.  2. There's a Right Way to Exercise Working out burns calories and fat and boosts your metabolism by building muscle. But those trying to lose weight are notorious for overestimating the number of calories they burn and underestimating the amount they take in. Unfortunately, your system is biologically programmed to hold on to extra pounds and that means when you start exercising, your body senses the deficit and ramps up its hunger signals. If you're not diligent, you'll eat everything you burn and then some. Use it to lose it. Cardio gets all the exercise glory, but strength and interval training are the real heroes. They help you build lean muscle, which in turn increases your metabolism and calorie-burning ability 3. Don't Overreact to Mild Hunger Some people have a hard time losing weight because of hunger anxiety. To them, being hungry is bad-something to be avoided at all costs-so they carry snacks with them and eat when they don't need to. Others eat because they're stressed out or bored. While you never want to get to the point of being ravenous (that's when bingeing is likely to happen), a hunger pang, a craving, or the fact that it's 3:00 p.m. should not send you racing for the vending machine or obsessing about the energy bar in your purse. Ideally, you should put off eating until your stomach is growling and it's difficult to concentrate.  Use it to lose it. When you feel the urge to eat, use the HALT method. Ask yourself, Am I really hungry? Or am I angry or anxious, lonely or bored, or tired? If you're still not certain, try the apple test. If you're truly hungry, an apple should seem delicious; if it doesn't, something else is going on. Or you can try drinking water and making yourself busy, if you are still hungry try a healthy  snack.  4. Not All Calories Are Created Equal The mechanics of weight loss are pretty simple: Take in fewer calories than you use for energy. But the kind of food you eat makes all the difference. Processed food that's high in saturated fat and refined starch or sugar can cause inflammation that disrupts the hormone signals that tell your brain you're full. The result: You eat a lot more.  Use it to lose it. Clean up your diet. Swap in whole, unprocessed foods, including vegetables, lean protein, and healthy fats that will fill you up and give you the biggest nutritional bang for your calorie buck. In a few weeks, as your brain starts receiving regular hunger and fullness signals once again, you'll notice that you feel less hungry overall and naturally start cutting back on the amount you eat.  5. Protein, Produce, and Plant-Based Fats  Are Your [place] Here's why eating the three Ps regularly will help you drop pounds. Protein fills you up. You need it to build lean muscle, which keeps your metabolism humming so that you can torch more fat. People in a weight-loss program who ate double the recommended daily allowance for protein (about 110 grams for a 150-pound woman) lost 70 percent of their weight from fat, while people who ate the RDA lost only about 40 percent, one study found. Produce is packed with filling fiber. "It's very difficult to consume too many calories if you're eating a lot of vegetables. Example: Three cups of broccoli is a lot of food, yet only 93 calories. (Fruit is another story. It can be easy to overeat and can contain a lot of calories from sugar, so be sure to monitor your intake.) Plant-based fats like olive oil and those in avocados and nuts are healthy and extra satiating.  Use it to lose it. Aim to incorporate each of the three Ps into every meal and snack. People who eat protein throughout the day are able to keep weight off, according to a study in the [place] of Clinical Nutrition. In addition to meat, poultry and seafood, good sources are beans, lentils, eggs, tofu, and yogurt. As for fat, keep portion sizes in check by measuring out salad dressing, oil, and nut butters (shoot for one to two tablespoons). Finally, eat veggies or a little fruit at every meal. People who did that consumed 308 fewer calories but didn't feel any hungrier than when they didn't eat more produce.  7. How You Eat Is As Important As What You Eat In order for your brain to register that you're full, you need to focus on what you're eating. Sit down whenever you eat, preferably at a table. Turn off the TV or computer, put down your phone, and look at your food. Smell it. Chew slowly, and don't put another bite on your fork until you swallow. When women ate lunch this attentively, they consumed 30 percent less when snacking later than those who listened to an audiobook at lunchtime, according to a study in the [place] of Nutrition. 8. Weighing Yourself Really Works The scale provides the best evidence about whether your efforts are paying off. Seeing the numbers tick up or down or stagnate is motivation to keep going-or to rethink your approach. A 2015 study at [facility] found that daily weigh-ins helped people lose more weight, keep it off, and maintain that loss, even after two years. Use it to lose it. Step on the scale at the same time every day for the best results. If your weight shoots up several pounds from one weigh-in to the next, don't freak out. Eating a lot of salt the night before or having your period is the likely culprit. The number should return to normal in a day or two. It's a steady climb that you need to do something about. 9. Too Much Stress and Too Little Sleep Are Your Enemies When you're tired and frazzled, your body cranks up the production of cortisol, the stress hormone that can cause carb cravings. Not getting enough sleep also boosts  your levels of ghrelin, a hormone associated with hunger, while suppressing leptin, a hormone that signals fullness and satiety. People on a diet who slept only five and a half hours a night for two weeks lost 55 percent less fat and were hungrier than those who slept eight and a half  hours, according to a study in the [place]. Use it to lose it. Prioritize sleep, aiming for seven hours or more a night, which research shows helps lower stress. And make sure you're getting quality zzz's. If a snoring spouse or a fidgety cat wakes you up frequently throughout the night, you may end up getting the equivalent of just four hours of sleep, according to a study from [facility]. Keep pets out of the bedroom, and use a white-noise app to drown out snoring. 10. You Will Hit a plateau-And You Can Bust Through It As you slim down, your body releases much less leptin, the fullness hormone.  If you're not strength training, start right now. Building muscle can raise your metabolism to help you overcome a plateau. To keep your body challenged and burning calories, incorporate new moves and more intense intervals into your workouts or add another sweat session to your weekly routine. Alternatively, cut an extra 100 calories or so a day from your diet. Now that you've lost weight, your body simply doesn't need as much fuel.   Ways to cut 100 calories  1. Eat your eggs with hot sauce OR salsa instead of cheese.  Eggs are great for breakfast, but many people consider eggs and cheese to be BFFs. Instead of cheese-1 oz. of cheddar has 114 calories-top your eggs with hot sauce, which contains no calories and helps with satiety and metabolism. Salsa is also a great option!!  2. Top your toast, waffles or pancakes with mashed berries instead of jelly or syrup. Half a cup of berries-fresh, frozen or thawed-has about 40 calories, compared with 2 tbsp. of maple syrup or jelly, which both have  about 100 calories. The berries will also give you a good punch of fiber, which helps keep you full and satisfied and won't spike blood sugar quickly like the jelly or syrup. 3. Swap the non-fat latte for black coffee with a splash of half-and-half. Contrary to its name, that non-fat latte has 130 calories and a startling 19g of carbohydrates per 16 oz. serving. Replacing that 'light' drinkable dessert with a black coffee with a splash of half-and-half saves you more than 100 calories per 16 oz. serving. 4. Sprinkle salads with freeze-dried raspberries instead of dried cranberries. If you want a sweet addition to your nutritious salad, stay away from dried cranberries. They have a whopping 130 calories per  cup and 30g carbohydrates. Instead, sprinkle freeze-dried raspberries guilt-free and save more than 100 calories per  cup serving, adding 3g of belly-filling fiber. 5. Go for mustard in place of mayo on your sandwich. Mustard can add really nice flavor to any sandwich, and there are tons of varieties, from spicy to honey. A serving of mayo is 95 calories, versus 10 calories in a serving of mustard. 6. Choose a DIY salad dressing instead of the store-bought kind. Mix Dijon or whole grain mustard with low-fat Kefir or red wine vinegar and garlic. 7. Use hummus as a spread instead of a dip. Use hummus as a spread on a high-fiber cracker or tortilla with a sandwich and save on calories without sacrificing taste. 8. Pick just one salad "accessory." Salad isn't automatically a calorie winner. It's easy to over-accessorize with toppings. Instead of topping your salad with nuts, avocado and cranberries (all three will clock in at 313 calories), just pick one. The next day, choose a different accessory, which will also keep your salad interesting. You don't wear all your  jewelry every day, right? 9. Ditch the white pasta in favor of spaghetti squash. One cup of cooked spaghetti squash has about 40  calories, compared with traditional spaghetti, which comes with more than 200. Spaghetti squash is also nutrient-dense. It's a good source of fiber and Vitamins A and C, and it can be eaten just like you would eat pasta-with a great tomato sauce and [place] meatballs or with pesto, tofu and spinach, for example. 10. Dress up your chili, soups and stews with non-fat [place] yogurt instead of sour cream. Just a 'dollop' of sour cream can set you back 115 calories and a whopping 12g of fat-seven of which are of the artery-clogging variety. Added bonus: [place] yogurt is packed with muscle-building protein, calcium and B Vitamins. 11. Mash cauliflower instead of mashed potatoes. One cup of traditional mashed potatoes-in all their creamy goodness-has more than 200 calories, compared to mashed cauliflower, which you can typically eat for less than 100 calories per 1 cup serving. Cauliflower is a great source of the antioxidant indole-3-carbinol (I3C), which may help reduce the risk of some cancers, like breast cancer. 12. Ditch the ice cream sundae in favor of a [place] yogurt parfait. Instead of a cup of ice cream or fro-yo for dessert, try 1 cup of nonfat Greek yogurt topped with fresh berries and a sprinkle of cacao nibs. Both toppings are packed with antioxidants, which can help reduce cellular inflammation and oxidative damage. And the comparison is a no-brainer: One cup of ice cream has about 275 calories; one cup of frozen yogurt has about 230; and a cup of Greek yogurt has just 130, plus twice the protein, so you're less likely to return to the freezer for a second helping. 13. Put olive oil in a spray container instead of using it directly from the bottle. Each tablespoon of olive oil is 120 calories and 15g of fat. Use a mister instead of pouring it straight into the pan or onto a salad. This allows for portion control and will save you more than 100 calories. 14. When baking, substitute canned pumpkin for  butter or oil. Canned pumpkin-not pumpkin pie mix-is loaded with Vitamin A, which is important for skin and eye health, as well as immunity. And the comparisons are pretty crazy:  cup of canned pumpkin has about 40 calories, compared to butter or oil, which has more than 800 calories. Yes, 800 calories. Applesauce and mashed banana can also serve as good substitutions for butter or oil, usually in a 1:1 ratio. 15. Top casseroles with high-fiber cereal instead of breadcrumbs. Breadcrumbs are typically made with white bread, while breakfast cereals contain 5-9g of fiber per serving. Not only will you save more than 150 calories per  cup serving, the swap will also keep you more full and you'll get a metabolism boost from the added fiber. 16. Snack on pistachios instead of macadamia nuts. Believe it or not, you get the same amount of calories from 35 pistachios (100 calories) as you would from only five macadamia nuts. 17. Chow down on kale chips rather than potato chips. This is my favorite 'don't knock it 'till you try it' swap. Kale chips are so easy to make at home, and you can spice them up with a little grated parmesan or chili powder. Plus, they're a mere fraction of the calories of potato chips, but with the same crunch factor we crave so often. 18. Add seltzer and some fruit slices to your cocktail instead of soda  or fruit juice. One cup of soda or fruit juice can pack on as much as 140 calories. Instead, use seltzer and fruit slices. The fruit provides valuable phytochemicals, such as flavonoids and anthocyanins, which help to combat cancer and stave off the aging process.   Diabetes is a very complicated disease...lets simplify it.  An easy way to look at it to understand the complications is if you think of the extra sugar floating in your blood stream as glass shards floating through your blood stream.    Diabetes affects your small vessels first: 1) The glass shards (sugar) scraps down  the tiny blood vessels in your eyes and lead to diabetic retinopathy, the leading cause of blindness in the [place]. Diabetes is the leading cause of newly diagnosed adult (69 to 32 years of age) blindness in the [place].  2) The glass shards scratches down the tiny vessels of your legs leading to nerve damage called neuropathy and can lead to amputations of your feet. More than 60% of all non-traumatic amputations of lower limbs occur in people with diabetes.  3) Over time the small vessels in your brain are shredded and closed off, individually this does not cause any problems but over a long period of time many of the small vessels being blocked can lead to Vascular Dementia.   4) Your kidney's are a filter system and have a "net" that keeps certain things in the body and lets bad things out. Sugar shreds this net and leads to kidney damage and eventually failure. Decreasing the sugar that is destroying the net and certain blood pressure medications can help stop or decrease progression of kidney disease. Diabetes was the primary cause of kidney failure in 44 percent of all new cases in 2011.  5) Diabetes also destroys the small vessels in your penis that lead to erectile dysfunction. Eventually the vessels are so damaged that you may not be responsive to cialis or viagra.   Diabetes and your large vessels: Your larger vessels consist of your coronary arteries in your heart and the carotid vessels to your brain. Diabetes or even increased sugars put you at 300% increased risk of heart attack and stroke and this is why.. The sugar scrapes down your large blood vessels and your body sees this as an internal injury and tries to repair itself. Just like you get a scab on your skin, your platelets will stick to the blood vessel wall trying to heal it. This is why we have diabetics on low dose aspirin daily, this prevents the platelets from sticking and can prevent plaque formation. In addition, your body  takes cholesterol and tries to shove it into the open wound. This is why we want your LDL, or bad cholesterol, below 70.   The combination of platelets and cholesterol over 5-10 years forms plaque that can break off and cause a heart attack or stroke.   PLEASE REMEMBER:  Diabetes is preventable! Up to 56 percent of complications and morbidities among individuals with type 2 diabetes can be prevented, delayed, or effectively treated and minimized with regular visits to a health professional, appropriate monitoring and medication, and a healthy diet and lifestyle.

## 2014-07-03 NOTE — Progress Notes (Signed)
. Assessment and Plan:  Hypertension: patient states with fluid pill + palps, frequent urination- will stop and switch to verapamil to help with HA too-, monitor blood pressure at home. Continue DASH diet.  Reminder to go to the ER if any CP, SOB, nausea, dizziness, severe HA, changes vision/speech, left arm numbness and tingling, and jaw pain. Cholesterol: Continue diet and exercise. Check cholesterol.  Pre-diabetes-Continue diet and exercise. Check A1C ans insulin Vitamin D Def- check level and continue medications.  Obesity with co morbidities- long discussion about weight loss, diet, and exercise Headache/migraine-? Sleep apnea/TMJ/migraine- will do apnea link, give TMJ info, will start on topamax if verapamil does not help Depression- will start on celexa 20mg  daliy, if does not tolerate will try wellbutrin  OVER 40 minutes of exam, counseling, chart review, referral performed Will do close follow up pending labs.  Continue diet and meds as discussed. Further disposition pending results of labs.  HPI 34 y.o. female  presents for 3 month follow up  has a past medical history of Migraines and Hypertension.  Her blood pressure has been controlled at home, she is on dyazide x a year, was on procardia that did not help, today their BP is BP: 120/78 mmHg  She does workout, walks 3 miles every day it is nice. She denies chest pain, shortness of breath, dizziness. 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 She is married x 2 years, and has 15 month boy, Biomedical engineer.  She has had migraines since she was 23, she been on topamax in the past and 200 would help. Has tried inderal, maxalt, lidocaine. She is interested in getting on something for her. She will have a headache 5 days a week, and will get migraines 2 x a month, HA is right occipital, pressure throbbing pain, + nausea/no vomiting, + photo sensitivty, no aura, will lay down sleep will help.  She has bilateral wrist/thumb pain for  dequervin's has been seeing the orthos she works with, has had injections, still painful.  Pap, last march with Dr. Marvel Plan. Normal menses but have been painful/heavy, has been better since birth of son. She has increased stress, she has new baby, does not like her job, husband lost his job got diagnosed with diabetes, finally has it back but depleted her savings,  And has been very anxious. Was tried on paxil. She will cry a lot, will emotionally eat, has never slept well. Has a hard time staying asleep. Occ will forget med and take at night. Poor support system. Husband is working constantly and gone, not sexually active at this time.    Current Medications:  Current Outpatient Prescriptions on File Prior to Visit  Medication Sig Dispense Refill  . triamterene-hydrochlorothiazide (DYAZIDE) 37.5-25 MG per capsule Take 1 capsule by mouth daily.     No current facility-administered medications on file prior to visit.   Medical History:  Past Medical History  Diagnosis Date  . Migraines   . Hypertension    Allergies: No Known Allergies   Review of Systems:  Review of Systems  Constitutional: Positive for malaise/fatigue. Negative for fever, chills, weight loss and diaphoresis.  HENT: Negative for congestion, ear discharge, ear pain, hearing loss, nosebleeds, sore throat and tinnitus.   Respiratory: Negative.  Negative for stridor.   Cardiovascular: Negative.   Gastrointestinal: Positive for heartburn and diarrhea. Negative for nausea, vomiting, abdominal pain, constipation, blood in stool and melena.  Genitourinary: Positive for frequency. Negative for dysuria, urgency, hematuria and  flank pain.  Musculoskeletal: Positive for joint pain (bilateral hands). Negative for myalgias, back pain, falls and neck pain.  Neurological: Positive for headaches. Negative for dizziness, tingling, tremors, sensory change, speech change, focal weakness, seizures, loss of consciousness and weakness.   Psychiatric/Behavioral: Positive for depression. Negative for suicidal ideas, hallucinations, memory loss and substance abuse. The patient is nervous/anxious and has insomnia.     Family history- Review and unchanged Social history- Review and unchanged Physical Exam: BP 120/78 mmHg  Pulse 80  Temp(Src) 97.7 F (36.5 C)  Resp 16  Ht 5\' 4"  (1.626 m)  Wt 202 lb (91.627 kg)  BMI 34.66 kg/m2  LMP 06/25/2014 (Exact Date)  Breastfeeding? No Wt Readings from Last 3 Encounters:  07/03/14 202 lb (91.627 kg)  12/21/13 201 lb 12.8 oz (91.536 kg)  05/28/13 234 lb (106.142 kg)   General Appearance: Well nourished, in no apparent distress. Eyes: PERRLA, EOMs, conjunctiva no swelling or erythema Sinuses: No Frontal/maxillary tenderness ENT/Mouth: Ext aud canals clear, TMs without erythema, bulging. No erythema, swelling, or exudate on post pharynx.  Tonsils not swollen or erythematous. Hearing normal.  Neck: Supple, thyroid normal.  Respiratory: Respiratory effort normal, BS equal bilaterally without rales, rhonchi, wheezing or stridor.  Cardio: RRR with no MRGs. Brisk peripheral pulses without edema.  Abdomen: Soft, + BS,  + mild  Tenderness epigastric, no guarding, rebound, hernias, masses. Lymphatics: Non tender without lymphadenopathy.  Musculoskeletal: Full ROM, 5/5 strength, Normal gait. + finkelstein test bilateral hands, good grip and good distal neurovascular.  Skin: Warm, dry without rashes, lesions, ecchymosis.  Neuro: Cranial nerves intact. Normal muscle tone, no cerebellar symptoms. Psych: Awake and oriented X 3, normal affect, Insight and Judgment appropriate.    Vicie Mutters, PA-C 11:48 AM [facility]

## 2014-07-06 ENCOUNTER — Other Ambulatory Visit: Payer: Self-pay | Admitting: Physician Assistant

## 2014-07-06 LAB — CBC WITH DIFFERENTIAL/PLATELET
Basophils Absolute: 0 10*3/uL (ref 0.0–0.2)
Basos: 0 %
EOS ABS: 0.1 10*3/uL (ref 0.0–0.4)
Eos: 1 %
HCT: 39.1 % (ref 34.0–46.6)
Hemoglobin: 13.6 g/dL (ref 11.1–15.9)
IMMATURE GRANS (ABS): 0 10*3/uL (ref 0.0–0.1)
IMMATURE GRANULOCYTES: 0 %
LYMPHS: 35 %
Lymphocytes Absolute: 3 10*3/uL (ref 0.7–3.1)
MCH: 30.6 pg (ref 26.6–33.0)
MCHC: 34.8 g/dL (ref 31.5–35.7)
MCV: 88 fL (ref 79–97)
Monocytes Absolute: 0.9 10*3/uL (ref 0.1–0.9)
Monocytes: 11 %
NEUTROS PCT: 53 %
Neutrophils Absolute: 4.5 10*3/uL (ref 1.4–7.0)
Platelets: 341 10*3/uL (ref 150–379)
RBC: 4.45 x10E6/uL (ref 3.77–5.28)
RDW: 13.1 % (ref 12.3–15.4)
WBC: 8.6 10*3/uL (ref 3.4–10.8)

## 2014-07-07 LAB — INSULIN, RANDOM: INSULIN: 19.2 u[IU]/mL (ref 2.6–24.9)

## 2014-07-07 LAB — HEPATIC FUNCTION PANEL
ALBUMIN: 4.5 g/dL (ref 3.5–5.5)
ALK PHOS: 47 IU/L (ref 39–117)
ALT: 46 IU/L — ABNORMAL HIGH (ref 0–32)
AST: 32 IU/L (ref 0–40)
Bilirubin Total: 0.3 mg/dL (ref 0.0–1.2)
Bilirubin, Direct: 0.08 mg/dL (ref 0.00–0.40)
Total Protein: 7.5 g/dL (ref 6.0–8.5)

## 2014-07-07 LAB — TSH: TSH: 1.47 u[IU]/mL (ref 0.450–4.500)

## 2014-07-07 LAB — CHOLESTEROL, TOTAL: Cholesterol, Total: 180 mg/dL (ref 100–199)

## 2014-07-07 LAB — BASIC METABOLIC PANEL
BUN/Creatinine Ratio: 15 (ref 8–20)
BUN: 11 mg/dL (ref 6–20)
CALCIUM: 9.6 mg/dL (ref 8.7–10.2)
CHLORIDE: 100 mmol/L (ref 97–108)
CO2: 23 mmol/L (ref 18–29)
Creatinine, Ser: 0.71 mg/dL (ref 0.57–1.00)
GFR calc Af Amer: 131 mL/min/{1.73_m2} (ref >59)
GFR calc non Af Amer: 114 mL/min/{1.73_m2} (ref >59)
GLUCOSE: 104 mg/dL — AB (ref 65–99)
Potassium: 4.3 mmol/L (ref 3.5–5.2)
Sodium: 139 mmol/L (ref 134–144)

## 2014-07-07 LAB — HGB A1C W/O EAG: HEMOGLOBIN A1C: 5.6 % (ref 4.8–5.6)

## 2014-07-07 LAB — MAGNESIUM: Magnesium: 1.7 mg/dL (ref 1.6–2.3)

## 2014-07-07 LAB — VITAMIN D 25 HYDROXY (VIT D DEFICIENCY, FRACTURES): Vit D, 25-Hydroxy: 8.4 ng/mL — ABNORMAL LOW (ref 30.0–100.0)

## 2014-07-07 LAB — VITAMIN B12: Vitamin B-12: 533 pg/mL (ref 211–946)

## 2014-07-08 ENCOUNTER — Other Ambulatory Visit: Payer: Self-pay | Admitting: Physician Assistant

## 2014-07-08 MED ORDER — VITAMIN D (ERGOCALCIFEROL) 1.25 MG (50000 UNIT) PO CAPS: 50000.0000 [IU] | ORAL_CAPSULE | ORAL | Status: DC

## 2014-08-07 ENCOUNTER — Ambulatory Visit: Payer: Self-pay | Admitting: Physician Assistant

## 2014-09-19 ENCOUNTER — Other Ambulatory Visit: Payer: Self-pay | Admitting: Physician Assistant

## 2014-09-23 ENCOUNTER — Other Ambulatory Visit: Payer: Self-pay | Admitting: Physician Assistant

## 2014-09-28 ENCOUNTER — Encounter: Payer: Self-pay | Admitting: *Deleted

## 2014-10-20 ENCOUNTER — Encounter: Payer: Self-pay | Admitting: *Deleted

## 2014-11-08 ENCOUNTER — Other Ambulatory Visit: Payer: Self-pay | Admitting: Physician Assistant

## 2014-11-23 ENCOUNTER — Encounter: Payer: Self-pay | Admitting: Physician Assistant

## 2014-11-23 ENCOUNTER — Ambulatory Visit (INDEPENDENT_AMBULATORY_CARE_PROVIDER_SITE_OTHER): Payer: No Typology Code available for payment source | Admitting: Physician Assistant

## 2014-11-23 VITALS — BP 134/86 | HR 84 | Temp 97.7°F | Resp 16 | Ht 64.0 in | Wt 207.0 lb

## 2014-11-23 DIAGNOSIS — E669 Obesity, unspecified: Secondary | ICD-10-CM

## 2014-11-23 DIAGNOSIS — I10 Essential (primary) hypertension: Secondary | ICD-10-CM

## 2014-11-23 DIAGNOSIS — E559 Vitamin D deficiency, unspecified: Secondary | ICD-10-CM

## 2014-11-23 DIAGNOSIS — Z79899 Other long term (current) drug therapy: Secondary | ICD-10-CM

## 2014-11-23 DIAGNOSIS — M26609 Unspecified temporomandibular joint disorder, unspecified side: Secondary | ICD-10-CM

## 2014-11-23 MED ORDER — VITAMIN D (ERGOCALCIFEROL) 1.25 MG (50000 UNIT) PO CAPS: ORAL_CAPSULE | ORAL | Status: DC

## 2014-11-23 MED ORDER — TOPIRAMATE 100 MG PO TABS: 200.0000 mg | ORAL_TABLET | Freq: Two times a day (BID) | ORAL | Status: DC

## 2014-11-23 MED ORDER — CYCLOBENZAPRINE HCL 10 MG PO TABS: ORAL_TABLET | ORAL | Status: DC

## 2014-11-23 MED ORDER — PHENTERMINE HCL 37.5 MG PO TABS: 37.5000 mg | ORAL_TABLET | Freq: Every day | ORAL | Status: DC

## 2014-11-23 NOTE — Progress Notes (Signed)
Assessment and Plan:  1. Hypertension -Continue medication, monitor blood pressure at home. Continue DASH diet.  Reminder to go to the ER if any CP, SOB, nausea, dizziness, severe HA, changes vision/speech, left arm numbness and tingling and jaw pain.  2. Cholesterol -Continue diet and exercise. Check cholesterol.   3. Obesity  long discussion about weight loss, diet, and exercise Phentermine 37.5 will follow   4. Vitamin D Def - check level and continue medications.   5. Migraines, increase topamax to 150mg , add flexeril 10mg  at night, talk with dentist.   Continue diet and meds as discussed. Further disposition pending results of labs. Over 30 minutes of exam, counseling, chart review, and critical decision making was performed  Follow up 1 month OV Takes labs to [facility]  HPI 34 y.o. female  presents for 3 month follow up on hypertension, cholesterol, prediabetes, and vitamin D deficiency.   Her blood pressure has been controlled at home, today their BP is BP: 134/86 mmHg  She was switched from fluid pill to verapamil to prevent headaches and she is on tomapax 100mg  however she has headaches still 5/7 days a week at night.   She does workout. She denies chest pain, shortness of breath, dizziness.  She is not on cholesterol medication and denies myalgias. Her cholesterol is at goal. The cholesterol last visit was:   Lab Results  Component Value Date   CHOL 180 07/06/2014    She has been working on diet and exercise for prediabetes, and denies paresthesia of the feet, polydipsia, polyuria and visual disturbances. Last A1C in the office was:  Lab Results  Component Value Date   HGBA1C 5.6 07/06/2014   Patient is on Vitamin D supplement, she is on vitamin D 50,000 once a week for 3 months but then stopped.  Lab Results  Component Value Date   VD25OH 8.4* 07/06/2014     She is on celexa 20mg , states she is not crying anymore but still has a lot of anxiety from work, her  husband and son.   BMI is Body mass index is 35.51 kg/(m^2)., she is working on diet and exercise. Wt Readings from Last 3 Encounters:  11/23/14 207 lb (93.895 kg)  07/03/14 202 lb (91.627 kg)  12/21/13 201 lb 12.8 oz (91.536 kg)     Current Medications:  Current Outpatient Prescriptions on File Prior to Visit  Medication Sig Dispense Refill  . citalopram (CELEXA) 20 MG tablet TAKE ONE TABLET BY MOUTH ONE TIME DAILY 30 tablet 0  . topiramate (TOPAMAX) 50 MG tablet Take 1 tablet (50 mg total) by mouth 2 (two) times daily. 60 tablet 2  . triamterene-hydrochlorothiazide (DYAZIDE) 37.5-25 MG per capsule Take 1 capsule by mouth daily.    . verapamil (CALAN-SR) 180 MG CR tablet Take 1 tablet (180 mg total) by mouth at bedtime. 30 tablet 11  . Vitamin D, Ergocalciferol, (DRISDOL) 50000 UNITS CAPS capsule Take 1 capsule (50,000 Units total) by mouth every 7 (seven) days. 30 capsule 1   No current facility-administered medications on file prior to visit.   Medical History:  Past Medical History  Diagnosis Date  . Migraines   . 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  Negative for fever, chills, weight loss and diaphoresis.  HENT: Negative for congestion, ear discharge, ear pain, hearing loss, nosebleeds, sore throat and tinnitus.  Eyes: Negative.   Respiratory: Negative.  Negative for stridor.   Cardiovascular: Negative.   Gastrointestinal: Negative.  Negative for heartburn, nausea, vomiting, abdominal pain, diarrhea, constipation, blood in stool and melena.  Genitourinary: Negative.  Negative for dysuria, urgency, frequency, hematuria and flank pain.  Musculoskeletal: Negative.  Negative for myalgias, back pain, joint pain, falls and neck pain.  Skin: Negative.   Neurological: Positive for headaches. Negative for dizziness, tingling, tremors, sensory change, speech  change, focal weakness, seizures, loss of consciousness and weakness.  Endo/Heme/Allergies: Negative.   Psychiatric/Behavioral: Positive for depression. Negative for suicidal ideas, hallucinations, memory loss and substance abuse. The patient has insomnia.     Family history- Review and unchanged Social history- Review and unchanged Physical Exam: BP 134/86 mmHg  Pulse 84  Temp(Src) 97.7 F (36.5 C)  Resp 16  Ht 5\' 4"  (1.626 m)  Wt 207 lb (93.895 kg)  BMI 35.51 kg/m2 Wt Readings from Last 3 Encounters:  11/23/14 207 lb (93.895 kg)  07/03/14 202 lb (91.627 kg)  12/21/13 201 lb 12.8 oz (91.536 kg)   General Appearance: Well nourished, in no apparent distress. Eyes: PERRLA, EOMs, conjunctiva no swelling or erythema Sinuses: No Frontal/maxillary tenderness ENT/Mouth: Ext aud canals clear, TMs without erythema, bulging. No erythema, swelling, or exudate on post pharynx.  Tonsils not swollen or erythematous. Hearing normal.  Neck: Supple, thyroid normal.  Respiratory: Respiratory effort normal, BS equal bilaterally without rales, rhonchi, wheezing or stridor.  Cardio: RRR with no MRGs. Brisk peripheral pulses without edema.  Abdomen: Soft, + BS,  Non tender, no guarding, rebound, hernias, masses. Lymphatics: Non tender without lymphadenopathy.  Musculoskeletal: Full ROM, 5/5 strength, Normal gait Skin: Warm, dry without rashes, lesions, ecchymosis.  Neuro: Cranial nerves intact. Normal muscle tone, no cerebellar symptoms. Psych: Awake and oriented X 3, normal affect, Insight and Judgment appropriate.    Vicie Mutters, PA-C 4:44 PM [facility]

## 2014-11-23 NOTE — Patient Instructions (Signed)
Breakfast- greek yogurt, light n fight greek, oikos triple zero, chaboni 100.  Add small amount of nuts  Phentermine  While taking the medication we may ask that you come into the office once a month or once every 2-3 months to monitor your weight, blood pressure, and heart rate. In addition we can help answer your questions about diet, exercise, and help you every step of the way with your weight loss journey. Sometime it is helpful if you bring in a food diary or use an app on your phone such as myfitnesspal to record your calorie intake, especially in the beginning.   You can start out on 1/3 to 1/2 a pill in the morning and if you are tolerating it well you can increase to one pill daily. I also have some patients that take 1/3 or 1/2 at lunch to help prevent night time eating.  This medication is cheapest CASH pay at [facility] is 14-17 dollars and you do NOT need a membership to get meds from there.    What is this medicine? PHENTERMINE (FEN ter meen) decreases your appetite. This medicine is intended to be used in addition to a healthy reduced calorie diet and exercise. The best results are achieved this way. This medicine is only indicated for short-term use. Eventually your weight loss may level out and the medication will no longer be needed.   How should I use this medicine? Take this medicine by mouth. Follow the directions on the prescription label. The tablets should stay in the bottle until immediately before you take your dose. Take your doses at regular intervals. Do not take your medicine more often than directed.  Overdosage: If you think you have taken too much of this medicine contact a poison control center or emergency room at once. NOTE: This medicine is only for you. Do not share this medicine with others.  What if I miss a dose? If you miss a dose, take it as soon as you can. If it is almost time for your next dose, take only that dose. Do not take  double or extra doses. Do not increase or in any way change your dose without consulting your doctor.  What should I watch for while using this medicine? Notify your physician immediately if you become short of breath while doing your normal activities. Do not take this medicine within 6 hours of bedtime. It can keep you from getting to sleep. Avoid drinks that contain caffeine and try to stick to a regular bedtime every night. Do not stand or sit up quickly, especially if you are an older patient. This reduces the risk of dizzy or fainting spells. Avoid alcoholic drinks.  What side effects may I notice from receiving this medicine? Side effects that you should report to your doctor or health care professional as soon as possible: -chest pain, palpitations -depression or severe changes in mood -increased blood pressure -irritability -nervousness or restlessness -severe dizziness -shortness of breath -problems urinating -unusual swelling of the legs -vomiting  Side effects that usually do not require medical attention (report to your doctor or health care professional if they continue or are bothersome): -blurred vision or other eye problems -changes in sexual ability or desire -constipation or diarrhea -difficulty sleeping -dry mouth or unpleasant taste -headache -nausea This list may not describe all possible side effects. Call your doctor for medical advice about side effects. You may report side effects to FDA at 1-800-FDA-1088.  Before you even begin to attack a weight-loss plan, it pays to remember this: You are not fat. You have fat. Losing weight isn't about blame or shame; it's simply another achievement to accomplish. Dieting is like any other skill-you have to buckle down and work at it. As long as you act in a smart, reasonable way, you'll ultimately get where you want to be. Here are some weight loss pearls for you.  1. It's Not a Diet. It's a Lifestyle Thinking of a diet  as something you're on and suffering through only for the short term doesn't work. To shed weight and keep it off, you need to make permanent changes to the way you eat. It's OK to indulge occasionally, of course, but if you cut calories temporarily and then revert to your old way of eating, you'll gain back the weight quicker than you can say yo-yo. Use it to lose it. Research shows that one of the best predictors of long-term weight loss is how many pounds you drop in the first month. For that reason, nutritionists often suggest being stricter for the first two weeks of your new eating strategy to build momentum. Cut out added sugar and alcohol and avoid unrefined carbs. After that, figure out how you can reincorporate them in a way that's healthy and maintainable.  2. There's a Right Way to Exercise Working out burns calories and fat and boosts your metabolism by building muscle. But those trying to lose weight are notorious for overestimating the number of calories they burn and underestimating the amount they take in. Unfortunately, your system is biologically programmed to hold on to extra pounds and that means when you start exercising, your body senses the deficit and ramps up its hunger signals. If you're not diligent, you'll eat everything you burn and then some. Use it to lose it. Cardio gets all the exercise glory, but strength and interval training are the real heroes. They help you build lean muscle, which in turn increases your metabolism and calorie-burning ability 3. Don't Overreact to Mild Hunger Some people have a hard time losing weight because of hunger anxiety. To them, being hungry is bad-something to be avoided at all costs-so they carry snacks with them and eat when they don't need to. Others eat because they're stressed out or bored. While you never want to get to the point of being ravenous (that's when bingeing is likely to happen), a hunger pang, a craving, or the fact that it's  3:00 p.m. should not send you racing for the vending machine or obsessing about the energy bar in your purse. Ideally, you should put off eating until your stomach is growling and it's difficult to concentrate.  Use it to lose it. When you feel the urge to eat, use the HALT method. Ask yourself, Am I really hungry? Or am I angry or anxious, lonely or bored, or tired? If you're still not certain, try the apple test. If you're truly hungry, an apple should seem delicious; if it doesn't, something else is going on. Or you can try drinking water and making yourself busy, if you are still hungry try a healthy snack.  4. Not All Calories Are Created Equal The mechanics of weight loss are pretty simple: Take in fewer calories than you use for energy. But the kind of food you eat makes all the difference. Processed food that's high in saturated fat and refined starch or sugar can cause inflammation that disrupts the hormone signals that  tell your brain you're full. The result: You eat a lot more.  Use it to lose it. Clean up your diet. Swap in whole, unprocessed foods, including vegetables, lean protein, and healthy fats that will fill you up and give you the biggest nutritional bang for your calorie buck. In a few weeks, as your brain starts receiving regular hunger and fullness signals once again, you'll notice that you feel less hungry overall and naturally start cutting back on the amount you eat.  5. Protein, Produce, and Plant-Based Fats Are Your Weight-Loss Trinity Here's why eating the three Ps regularly will help you drop pounds. Protein fills you up. You need it to build lean muscle, which keeps your metabolism humming so that you can torch more fat. People in a weight-loss program who ate double the recommended daily allowance for protein (about 110 grams for a 150-pound woman) lost 70 percent of their weight from fat, while people who ate the RDA lost only about 40 percent, one study found. Produce is  packed with filling fiber. "It's very difficult to consume too many calories if you're eating a lot of vegetables. Example: Three cups of broccoli is a lot of food, yet only 93 calories. (Fruit is another story. It can be easy to overeat and can contain a lot of calories from sugar, so be sure to monitor your intake.) Plant-based fats like olive oil and those in avocados and nuts are healthy and extra satiating.  Use it to lose it. Aim to incorporate each of the three Ps into every meal and snack. People who eat protein throughout the day are able to keep weight off, according to a study in the [place] of Clinical Nutrition. In addition to meat, poultry and seafood, good sources are beans, lentils, eggs, tofu, and yogurt. As for fat, keep portion sizes in check by measuring out salad dressing, oil, and nut butters (shoot for one to two tablespoons). Finally, eat veggies or a little fruit at every meal. People who did that consumed 308 fewer calories but didn't feel any hungrier than when they didn't eat more produce.  7. How You Eat Is As Important As What You Eat In order for your brain to register that you're full, you need to focus on what you're eating. Sit down whenever you eat, preferably at a table. Turn off the TV or computer, put down your phone, and look at your food. Smell it. Chew slowly, and don't put another bite on your fork until you swallow. When women ate lunch this attentively, they consumed 30 percent less when snacking later than those who listened to an audiobook at lunchtime, according to a study in the [place] of Nutrition. 8. Weighing Yourself Really Works The scale provides the best evidence about whether your efforts are paying off. Seeing the numbers tick up or down or stagnate is motivation to keep going-or to rethink your approach. A 2015 study at [facility] found that daily weigh-ins helped people lose more weight, keep it off, and maintain that loss,  even after two years. Use it to lose it. Step on the scale at the same time every day for the best results. If your weight shoots up several pounds from one weigh-in to the next, don't freak out. Eating a lot of salt the night before or having your period is the likely culprit. The number should return to normal in a day or two. It's a steady climb that you need to do something  about. 9. Too Much Stress and Too Little Sleep Are Your Enemies When you're tired and frazzled, your body cranks up the production of cortisol, the stress hormone that can cause carb cravings. Not getting enough sleep also boosts your levels of ghrelin, a hormone associated with hunger, while suppressing leptin, a hormone that signals fullness and satiety. People on a diet who slept only five and a half hours a night for two weeks lost 55 percent less fat and were hungrier than those who slept eight and a half hours, according to a study in the [place]. Use it to lose it. Prioritize sleep, aiming for seven hours or more a night, which research shows helps lower stress. And make sure you're getting quality zzz's. If a snoring spouse or a fidgety cat wakes you up frequently throughout the night, you may end up getting the equivalent of just four hours of sleep, according to a study from [facility]. Keep pets out of the bedroom, and use a white-noise app to drown out snoring. 10. You Will Hit a plateau-And You Can Bust Through It As you slim down, your body releases much less leptin, the fullness hormone.  If you're not strength training, start right now. Building muscle can raise your metabolism to help you overcome a plateau. To keep your body challenged and burning calories, incorporate new moves and more intense intervals into your workouts or add another sweat session to your weekly routine. Alternatively, cut an extra 100 calories or so a day from your diet. Now that you've lost weight, your body  simply doesn't need as much fuel.   Ways to cut 100 calories  1. Eat your eggs with hot sauce OR salsa instead of cheese.  Eggs are great for breakfast, but many people consider eggs and cheese to be BFFs. Instead of cheese-1 oz. of cheddar has 114 calories-top your eggs with hot sauce, which contains no calories and helps with satiety and metabolism. Salsa is also a great option!!  2. Top your toast, waffles or pancakes with mashed berries instead of jelly or syrup. Half a cup of berries-fresh, frozen or thawed-has about 40 calories, compared with 2 tbsp. of maple syrup or jelly, which both have about 100 calories. The berries will also give you a good punch of fiber, which helps keep you full and satisfied and won't spike blood sugar quickly like the jelly or syrup. 3. Swap the non-fat latte for black coffee with a splash of half-and-half. Contrary to its name, that non-fat latte has 130 calories and a startling 19g of carbohydrates per 16 oz. serving. Replacing that 'light' drinkable dessert with a black coffee with a splash of half-and-half saves you more than 100 calories per 16 oz. serving. 4. Sprinkle salads with freeze-dried raspberries instead of dried cranberries. If you want a sweet addition to your nutritious salad, stay away from dried cranberries. They have a whopping 130 calories per  cup and 30g carbohydrates. Instead, sprinkle freeze-dried raspberries guilt-free and save more than 100 calories per  cup serving, adding 3g of belly-filling fiber. 5. Go for mustard in place of mayo on your sandwich. Mustard can add really nice flavor to any sandwich, and there are tons of varieties, from spicy to honey. A serving of mayo is 95 calories, versus 10 calories in a serving of mustard. 6. Choose a DIY salad dressing instead of the store-bought kind. Mix Dijon or whole grain mustard with low-fat Kefir or red wine vinegar  and garlic. 7. Use hummus as a spread instead of a dip. Use hummus as a  spread on a high-fiber cracker or tortilla with a sandwich and save on calories without sacrificing taste. 8. Pick just one salad "accessory." Salad isn't automatically a calorie winner. It's easy to over-accessorize with toppings. Instead of topping your salad with nuts, avocado and cranberries (all three will clock in at 313 calories), just pick one. The next day, choose a different accessory, which will also keep your salad interesting. You don't wear all your jewelry every day, right? 9. Ditch the white pasta in favor of spaghetti squash. One cup of cooked spaghetti squash has about 40 calories, compared with traditional spaghetti, which comes with more than 200. Spaghetti squash is also nutrient-dense. It's a good source of fiber and Vitamins A and C, and it can be eaten just like you would eat pasta-with a great tomato sauce and [place] meatballs or with pesto, tofu and spinach, for example. 10. Dress up your chili, soups and stews with non-fat [place] yogurt instead of sour cream. Just a 'dollop' of sour cream can set you back 115 calories and a whopping 12g of fat-seven of which are of the artery-clogging variety. Added bonus: [place] yogurt is packed with muscle-building protein, calcium and B Vitamins. 11. Mash cauliflower instead of mashed potatoes. One cup of traditional mashed potatoes-in all their creamy goodness-has more than 200 calories, compared to mashed cauliflower, which you can typically eat for less than 100 calories per 1 cup serving. Cauliflower is a great source of the antioxidant indole-3-carbinol (I3C), which may help reduce the risk of some cancers, like breast cancer. 12. Ditch the ice cream sundae in favor of a [place] yogurt parfait. Instead of a cup of ice cream or fro-yo for dessert, try 1 cup of nonfat Greek yogurt topped with fresh berries and a sprinkle of cacao nibs. Both toppings are packed with antioxidants, which can help reduce cellular inflammation and oxidative damage.  And the comparison is a no-brainer: One cup of ice cream has about 275 calories; one cup of frozen yogurt has about 230; and a cup of Greek yogurt has just 130, plus twice the protein, so you're less likely to return to the freezer for a second helping. 13. Put olive oil in a spray container instead of using it directly from the bottle. Each tablespoon of olive oil is 120 calories and 15g of fat. Use a mister instead of pouring it straight into the pan or onto a salad. This allows for portion control and will save you more than 100 calories. 14. When baking, substitute canned pumpkin for butter or oil. Canned pumpkin-not pumpkin pie mix-is loaded with Vitamin A, which is important for skin and eye health, as well as immunity. And the comparisons are pretty crazy:  cup of canned pumpkin has about 40 calories, compared to butter or oil, which has more than 800 calories. Yes, 800 calories. Applesauce and mashed banana can also serve as good substitutions for butter or oil, usually in a 1:1 ratio. 15. Top casseroles with high-fiber cereal instead of breadcrumbs. Breadcrumbs are typically made with white bread, while breakfast cereals contain 5-9g of fiber per serving. Not only will you save more than 150 calories per  cup serving, the swap will also keep you more full and you'll get a metabolism boost from the added fiber. 16. Snack on pistachios instead of macadamia nuts. Believe it or not, you get the same amount of calories from  35 pistachios (100 calories) as you would from only five macadamia nuts. 17. Chow down on kale chips rather than potato chips. This is my favorite 'don't knock it 'till you try it' swap. Kale chips are so easy to make at home, and you can spice them up with a little grated parmesan or chili powder. Plus, they're a mere fraction of the calories of potato chips, but with the same crunch factor we crave so often. 18. Add seltzer and some fruit slices to your cocktail instead of soda  or fruit juice. One cup of soda or fruit juice can pack on as much as 140 calories. Instead, use seltzer and fruit slices. The fruit provides valuable phytochemicals, such as flavonoids and anthocyanins, which help to combat cancer and stave off the aging process.

## 2014-12-14 ENCOUNTER — Other Ambulatory Visit: Payer: Self-pay | Admitting: Physician Assistant

## 2014-12-15 LAB — VITAMIN D 25 HYDROXY (VIT D DEFICIENCY, FRACTURES): Vit D, 25-Hydroxy: 27.3 ng/mL — ABNORMAL LOW (ref 30.0–100.0)

## 2014-12-22 ENCOUNTER — Ambulatory Visit (INDEPENDENT_AMBULATORY_CARE_PROVIDER_SITE_OTHER): Payer: No Typology Code available for payment source | Admitting: Physician Assistant

## 2014-12-22 ENCOUNTER — Encounter: Payer: Self-pay | Admitting: Physician Assistant

## 2014-12-22 VITALS — BP 118/88 | HR 92 | Temp 98.0°F | Resp 18 | Ht 64.0 in | Wt 195.0 lb

## 2014-12-22 DIAGNOSIS — G43809 Other migraine, not intractable, without status migrainosus: Secondary | ICD-10-CM

## 2014-12-22 DIAGNOSIS — I10 Essential (primary) hypertension: Secondary | ICD-10-CM

## 2014-12-22 DIAGNOSIS — E669 Obesity, unspecified: Secondary | ICD-10-CM | POA: Diagnosis not present

## 2014-12-22 MED ORDER — DROSPIRENONE-ETHINYL ESTRADIOL 3-0.03 MG PO TABS: 1.0000 | ORAL_TABLET | Freq: Every day | ORAL | Status: DC

## 2014-12-22 MED ORDER — PHENTERMINE HCL 37.5 MG PO TABS: 37.5000 mg | ORAL_TABLET | Freq: Every day | ORAL | Status: DC

## 2014-12-22 MED ORDER — VITAMIN D (ERGOCALCIFEROL) 1.25 MG (50000 UNIT) PO CAPS: ORAL_CAPSULE | ORAL | Status: DC

## 2014-12-22 NOTE — Progress Notes (Signed)
Assessment and Plan: 1. Essential hypertension - continue medications, DASH diet, exercise and monitor at home. Call if greater than 130/80.   2. Obesity Obesity with co morbidities- long discussion about weight loss, diet, and exercise, will start the patient on phentermine- hand out given and AE's discussed, will do close follow up.   3. Other migraine without status migrainosus, not intractable Continue medications  4. BCP ocella sent in, can take continuous   Follow up 2 months  HPI 34 y.o.female presents for 1 month follow up from 3 month visit for review of new start of phentermine and migraines. Last visit her topamax was increased to 200 mg and she has not had any more headaches and she was given flexeril for migraines that she will take at night that has helped. She was also started on phentermine for obesity.  BMI is Body mass index is 33.46 kg/(m^2)., she is working on diet and exercise, she has lost 12 lbs in 1 month with diet, exercise and phentermine. No increase in BP, no palpitations, no constipation. She would like to get on BCP, not having much sex with her husband but incase she would like to get on something.  Wt Readings from Last 3 Encounters:  12/22/14 195 lb (88.451 kg)  11/23/14 207 lb (93.895 kg)  07/03/14 202 lb (91.627 kg)   Patient Active Problem List   Diagnosis Date Noted  . Essential hypertension 07/03/2014  . Migraine 07/03/2014  . TMJ (temporomandibular joint syndrome) 07/03/2014  . Obesity 07/03/2014  . Preeclampsia 05/28/2013   No Known Allergies   Current Outpatient Prescriptions on File Prior to Visit  Medication Sig Dispense Refill  . citalopram (CELEXA) 20 MG tablet TAKE ONE TABLET BY MOUTH ONE TIME DAILY 30 tablet 0  . cyclobenzaprine (FLEXERIL) 10 MG tablet 1 pill at night as needed for headahce 30 tablet 1  . phentermine (ADIPEX-P) 37.5 MG tablet Take 1 tablet (37.5 mg total) by mouth daily before breakfast. 30 tablet 0  . topiramate  (TOPAMAX) 100 MG tablet Take 2 tablets (200 mg total) by mouth 2 (two) times daily. 60 tablet 2  . verapamil (CALAN-SR) 180 MG CR tablet Take 1 tablet (180 mg total) by mouth at bedtime. 30 tablet 11  . Vitamin D, Ergocalciferol, (DRISDOL) 50000 UNITS CAPS capsule 1 pill 3 days a week 30 capsule 1   No current facility-administered medications on file prior to visit.    ROS: all negative except above.   Physical Exam: Filed Weights   12/22/14 1628  Weight: 195 lb (88.451 kg)   BP 118/88 mmHg  Pulse 92  Temp(Src) 98 F (36.7 C) (Temporal)  Resp 18  Ht 5\' 4"  (1.626 m)  Wt 195 lb (88.451 kg)  BMI 33.46 kg/m2  LMP 11/27/2014 General Appearance: Well nourished, in no apparent distress. Eyes: PERRLA, EOMs, conjunctiva no swelling or erythema Sinuses: No Frontal/maxillary tenderness ENT/Mouth: Ext aud canals clear, TMs without erythema, bulging. No erythema, swelling, or exudate on post pharynx.  Tonsils not swollen or erythematous. Hearing normal.  Neck: Supple, thyroid normal.  Respiratory: Respiratory effort normal, BS equal bilaterally without rales, rhonchi, wheezing or stridor.  Cardio: RRR with no MRGs. Brisk peripheral pulses without edema.  Abdomen: Soft, + BS.  Non tender, no guarding, rebound, hernias, masses. Lymphatics: Non tender without lymphadenopathy.  Musculoskeletal: Full ROM, 5/5 strength, normal gait.  Skin: Warm, dry without rashes, lesions, ecchymosis.  Neuro: Cranial nerves intact. Normal muscle tone, no cerebellar symptoms. Sensation intact.  Psych: Awake and oriented X 3, normal affect, Insight and Judgment appropriate.     Vicie Mutters, PA-C 4:32 PM [facility]

## 2014-12-22 NOTE — Patient Instructions (Signed)
1) try to drink at least 80 oz of water a day best would 100 oz of water a day.  2) try to prepare lunch ideas/cut veggies over the weekends  Phentermine  While taking the medication we may ask that you come into the office once a month or once every 2-3 months to monitor your weight, blood pressure, and heart rate. In addition we can help answer your questions about diet, exercise, and help you every step of the way with your weight loss journey. Sometime it is helpful if you bring in a food diary or use an app on your phone such as myfitnesspal to record your calorie intake, especially in the beginning.   You can start out on 1/3 to 1/2 a pill in the morning and if you are tolerating it well you can increase to one pill daily. I also have some patients that take 1/3 or 1/2 at lunch to help prevent night time eating.  This medication is cheapest CASH pay at [facility] is 14-17 dollars and you do NOT need a membership to get meds from there.    What is this medicine? PHENTERMINE (FEN ter meen) decreases your appetite. This medicine is intended to be used in addition to a healthy reduced calorie diet and exercise. The best results are achieved this way. This medicine is only indicated for short-term use. Eventually your weight loss may level out and the medication will no longer be needed.   How should I use this medicine? Take this medicine by mouth. Follow the directions on the prescription label. The tablets should stay in the bottle until immediately before you take your dose. Take your doses at regular intervals. Do not take your medicine more often than directed.  Overdosage: If you think you have taken too much of this medicine contact a poison control center or emergency room at once. NOTE: This medicine is only for you. Do not share this medicine with others.  What if I miss a dose? If you miss a dose, take it as soon as you can. If it is almost time for your next  dose, take only that dose. Do not take double or extra doses. Do not increase or in any way change your dose without consulting your doctor.  What should I watch for while using this medicine? Notify your physician immediately if you become short of breath while doing your normal activities. Do not take this medicine within 6 hours of bedtime. It can keep you from getting to sleep. Avoid drinks that contain caffeine and try to stick to a regular bedtime every night. Do not stand or sit up quickly, especially if you are an older patient. This reduces the risk of dizzy or fainting spells. Avoid alcoholic drinks.  What side effects may I notice from receiving this medicine? Side effects that you should report to your doctor or health care professional as soon as possible: -chest pain, palpitations -depression or severe changes in mood -increased blood pressure -irritability -nervousness or restlessness -severe dizziness -shortness of breath -problems urinating -unusual swelling of the legs -vomiting  Side effects that usually do not require medical attention (report to your doctor or health care professional if they continue or are bothersome): -blurred vision or other eye problems -changes in sexual ability or desire -constipation or diarrhea -difficulty sleeping -dry mouth or unpleasant taste -headache -nausea This list may not describe all possible side effects. Call your doctor for medical advice about  side effects. You may report side effects to FDA at 1-800-FDA-1088.

## 2014-12-31 ENCOUNTER — Other Ambulatory Visit: Payer: Self-pay | Admitting: Physician Assistant

## 2015-04-12 ENCOUNTER — Ambulatory Visit: Payer: Self-pay | Admitting: Physician Assistant

## 2015-04-15 ENCOUNTER — Other Ambulatory Visit: Payer: Self-pay | Admitting: Physician Assistant

## 2015-05-20 ENCOUNTER — Encounter: Payer: Self-pay | Admitting: Physician Assistant

## 2015-05-20 ENCOUNTER — Ambulatory Visit (INDEPENDENT_AMBULATORY_CARE_PROVIDER_SITE_OTHER): Payer: Managed Care, Other (non HMO) | Admitting: Physician Assistant

## 2015-05-20 VITALS — BP 132/76 | HR 81 | Temp 97.7°F | Resp 16 | Ht 64.0 in | Wt 182.0 lb

## 2015-05-20 DIAGNOSIS — E559 Vitamin D deficiency, unspecified: Secondary | ICD-10-CM

## 2015-05-20 DIAGNOSIS — Z79899 Other long term (current) drug therapy: Secondary | ICD-10-CM

## 2015-05-20 DIAGNOSIS — G43809 Other migraine, not intractable, without status migrainosus: Secondary | ICD-10-CM | POA: Diagnosis not present

## 2015-05-20 DIAGNOSIS — E669 Obesity, unspecified: Secondary | ICD-10-CM

## 2015-05-20 DIAGNOSIS — F32A Depression, unspecified: Secondary | ICD-10-CM

## 2015-05-20 DIAGNOSIS — I10 Essential (primary) hypertension: Secondary | ICD-10-CM

## 2015-05-20 DIAGNOSIS — F329 Major depressive disorder, single episode, unspecified: Secondary | ICD-10-CM

## 2015-05-20 MED ORDER — VITAMIN D (ERGOCALCIFEROL) 1.25 MG (50000 UNIT) PO CAPS
ORAL_CAPSULE | ORAL | Status: DC
Start: 2015-05-20 — End: 2016-02-09

## 2015-05-20 MED ORDER — KETOPROFEN 50 MG PO CAPS: 50.0000 mg | ORAL_CAPSULE | Freq: Four times a day (QID) | ORAL | Status: DC | PRN

## 2015-05-20 MED ORDER — BUPROPION HCL ER (XL) 150 MG PO TB24: 150.0000 mg | ORAL_TABLET | Freq: Every day | ORAL | Status: DC

## 2015-05-20 NOTE — Patient Instructions (Signed)
Add wellbutrin to celexa 20mg  If you get flushing, hot flashes, diarrhea, fast heart rate stop it If this does not help take two of th celexa for a total of 40mg   Take new medications AS needed, can take ith tylenol  If this does not help migraines will increase verapamil  Follow up 1 month   Serotonin Syndrome Serotonin is a brain chemical that regulates the nervous system, which includes the brain, spinal cord, and nerves. Serotonin appears to play a role in all types of behavior, including appetite, emotions, movement, thinking, and response to stress. Excessively high levels of serotonin in the body can cause serotonin syndrome, which is a very dangerous condition. CAUSES This condition can be caused by taking medicines or drugs that increase the level of serotonin in your body. These include:  Antidepressant medicines.  Migraine medicines.  Certain pain medicines.  Certain recreational drugs, including ecstasy, LSD, cocaine, and amphetamines.  Over-the-counter cough or cold medicines that contain dextromethorphan.  Certain herbal supplements, including St. John's wort, ginseng, and nutmeg. This condition usually occurs when you take these medicines or drugs in combination, but it can also happen with a high dose of a single medicine or drug. RISK FACTORS This condition is more likely to develop in:  People who have recently increased the dosage of medicine that increases the serotonin level.  People who just started taking medicine that increases the serotonin level. SYMPTOMS Symptoms of this condition usually happens within several hours of a medicine change. Symptoms include:  Headache.  Muscle twitching or stiffness.  Diarrhea.  Confusion.  Restlessness or agitation.  Shivering or goose bumps.  Loss of muscle coordination.  Rapid heart rate.  Sweating. Severe cases of serotonin syndromecan cause:  Irregular heartbeat.  Seizures.  Loss of  consciousness.  High fever. DIAGNOSIS This condition is diagnosed with a medical history and physical exam. You will be asked aboutyour symptoms and your use of medicines and recreational drugs. Your health care provider may also order lab work or additional tests to rule out other causes of your symptoms. TREATMENT The treatment for this condition depends on the severity of your symptoms. For mild cases, stopping the medicine that caused your condition is usually all that is needed. For moderate to severe cases, hospitalization is required to monitor you and to prevent further muscle damage. HOME CARE INSTRUCTIONS  Take over-the-counter and prescription medicines only as told by your health care provider. This is important.  Check with your health care provider before you start taking any new prescriptions, over-the-counter medicines, herbs, or supplements.  Avoid combining any medicines that can cause this condition to occur.  Keep all follow-up visits as told by your health care provider.This is important.  Maintain a healthy lifestyle.  Eat healthy foods.  Get plenty of sleep.  Exercise regularly.  Do not drink alcohol.  Do not use recreational drugs. SEEK MEDICAL CARE IF:  Medicines do not seem to be helping.  Your symptoms do not improve or they get worse.  You have trouble taking care of yourself. SEEK IMMEDIATE MEDICAL CARE IF:  You have worsening confusion, severe headache, chest pain, high fever, seizures, or loss of consciousness.  You have serious thoughts about hurting yourself or others.  You experience serious side effects of medicine, such as swelling of your face, lips, tongue, or throat.   This information is not intended to replace advice given to you by your health care provider. Make sure you discuss any questions you have  with your health care provider.   Document Released: 05/18/2004 Document Revised: 08/25/2014 Document Reviewed:  04/23/2014 Elsevier Interactive Patient Education [employer].   Counseling services  Dr. Arbutus Leas, Ph.D. [address], [place] [address] Phone: [phone], [place] UM:[id number] [place] [address], [place] [address]   [facility] Hours: Monday-Thursday 830-8pm  Friday 830AM-7PM Address: [place] [phone].  Address: [place], [place] [address] [facility] [phone] office [email or web address] [address] [place], [place], [place] [address]  Rema Fendt, therapist  Toy Cookey, MA, clinical psychologist  Cognitive-Behavior Therapy; Mood Disorders; Anxiety Disorders; adult and child ADHD; Family Therapy; Stress Management; personal growth, and Marital Therapy.    Terrance Mass Ph.D., clinical psychologist Cognitive-Behavior Therapy; Mood Disorders; Anxiety Disorders; Stress     Management  [facility] [address], [place], [place] [address] [phone]   [facility], psychotherapist [address] [place], [place] [address] [phone]  Karin Golden Ph.D., clinical psychologist [phone] office [place],  [address] Cognitive Behavior Therapy, Depression, Bipolar, Anxiety, Grief and Loss

## 2015-05-20 NOTE — Progress Notes (Signed)
Assessment and Plan:  1. Hypertension -Continue medication, monitor blood pressure at home. Continue DASH diet.  Reminder to go to the ER if any CP, SOB, nausea, dizziness, severe HA, changes vision/speech, left arm numbness and tingling and jaw pain.  2. Cholesterol -Continue diet and exercise. Check cholesterol.   3. Obesity  long discussion about weight loss, diet, and exercise  4. Vitamin D Def - check level and continue medications.   5. Migraines, increase topamax to 200mg , continue verapamil, add flexeril 10mg  at night, talk with dentist.  ketoprofen  6. Depression Continue celexa will add on wellbutrin  Continue diet and meds as discussed. Further disposition pending results of labs. Over 30 minutes of exam, counseling, chart review, and critical decision making was performed Labs done at [facility]  HPI 35 y.o. female  presents for 3 month follow up on hypertension, cholesterol, prediabetes, and vitamin D deficiency.   Her blood pressure has been controlled at home, today their BP is BP: 132/76 mmHg  She is on verapamil and topamax for migraines.   She does workout. She denies chest pain, shortness of breath, dizziness.  She is not on cholesterol medication and denies myalgias. Her cholesterol is at goal. The cholesterol last visit was:   Lab Results  Component Value Date   CHOL 180 07/06/2014    She has been working on diet and exercise for prediabetes, and denies paresthesia of the feet, polydipsia, polyuria and visual disturbances. Last A1C in the office was:  Lab Results  Component Value Date   HGBA1C 5.6 07/06/2014   Patient is on Vitamin D supplement, she is on vitamin D 50,000 once a week for 3 months but then stopped.  Lab Results  Component Value Date   VD25OH 27.3* 12/14/2014     BMI is Body mass index is 31.22 kg/(m^2)., she is working on diet and exercise. She is off the phentermine, states she has been under a lot of stress the last 2 months, has been  having more anxiety and headaches. She still hates her job, has increased, her husband has two jobs and is gone a lot. Son is now toddler.  Wt Readings from Last 3 Encounters:  05/20/15 182 lb (82.555 kg)  12/22/14 195 lb (88.451 kg)  11/23/14 207 lb (93.895 kg)     Current Medications:  Current Outpatient Prescriptions on File Prior to Visit  Medication Sig Dispense Refill  . citalopram (CELEXA) 20 MG tablet TAKE ONE TABLET BY MOUTH ONE TIME DAILY 30 tablet 3  . cyclobenzaprine (FLEXERIL) 10 MG tablet 1 pill at night as needed for headahce 30 tablet 1  . drospirenone-ethinyl estradiol (OCELLA) 3-0.03 MG tablet Take 1 tablet by mouth daily. 4 Package 4  . topiramate (TOPAMAX) 100 MG tablet Take 2 tablets (200 mg total) by mouth 2 (two) times daily. 60 tablet 2  . verapamil (CALAN-SR) 180 MG CR tablet Take 1 tablet (180 mg total) by mouth at bedtime. 30 tablet 11  . Vitamin D, Ergocalciferol, (DRISDOL) 50000 UNITS CAPS capsule 1 pill 4 days a week 30 capsule 1   No current facility-administered medications on file prior to visit.   Medical History:  Past Medical History  Diagnosis Date  . Migraines   . 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  Negative for fever, chills, weight loss and diaphoresis.  HENT: Negative for congestion,  ear discharge, ear pain, hearing loss, nosebleeds, sore throat and tinnitus.   Eyes: Negative.   Respiratory: Negative.  Negative for stridor.   Cardiovascular: Negative.   Gastrointestinal: Negative.  Negative for heartburn, nausea, vomiting, abdominal pain, diarrhea, constipation, blood in stool and melena.  Genitourinary: Negative.  Negative for dysuria, urgency, frequency, hematuria and flank pain.  Musculoskeletal: Negative.  Negative for myalgias, back pain, joint pain, falls and neck pain.  Skin: Negative.   Neurological:  Positive for headaches. Negative for dizziness, tingling, tremors, sensory change, speech change, focal weakness, seizures, loss of consciousness and weakness.  Endo/Heme/Allergies: Negative.   Psychiatric/Behavioral: Positive for depression. Negative for suicidal ideas, hallucinations, memory loss and substance abuse. The patient has insomnia.     Family history- Review and unchanged Social history- Review and unchanged Physical Exam: BP 132/76 mmHg  Pulse 81  Temp(Src) 97.7 F (36.5 C) (Temporal)  Resp 16  Ht 5\' 4"  (1.626 m)  Wt 182 lb (82.555 kg)  BMI 31.22 kg/m2  SpO2 99% Wt Readings from Last 3 Encounters:  05/20/15 182 lb (82.555 kg)  12/22/14 195 lb (88.451 kg)  11/23/14 207 lb (93.895 kg)   General Appearance: Well nourished, in no apparent distress. Eyes: PERRLA, EOMs, conjunctiva no swelling or erythema Sinuses: No Frontal/maxillary tenderness ENT/Mouth: Ext aud canals clear, TMs without erythema, bulging. No erythema, swelling, or exudate on post pharynx.  Tonsils not swollen or erythematous. Hearing normal.  Neck: Supple, thyroid normal.  Respiratory: Respiratory effort normal, BS equal bilaterally without rales, rhonchi, wheezing or stridor.  Cardio: RRR with no MRGs. Brisk peripheral pulses without edema.  Abdomen: Soft, + BS,  Non tender, no guarding, rebound, hernias, masses. Lymphatics: Non tender without lymphadenopathy.  Musculoskeletal: Full ROM, 5/5 strength, Normal gait Skin: Warm, dry without rashes, lesions, ecchymosis.  Neuro: Cranial nerves intact. Normal muscle tone, no cerebellar symptoms. Psych: Awake and oriented X 3, normal affect, Insight and Judgment appropriate.    Vicie Mutters, PA-C 4:47 PM [facility]

## 2015-05-29 ENCOUNTER — Other Ambulatory Visit: Payer: Self-pay | Admitting: Internal Medicine

## 2015-06-19 ENCOUNTER — Other Ambulatory Visit: Payer: Self-pay | Admitting: Physician Assistant

## 2015-06-23 ENCOUNTER — Other Ambulatory Visit: Payer: Self-pay

## 2015-06-23 ENCOUNTER — Encounter: Payer: Self-pay | Admitting: Physician Assistant

## 2015-06-23 ENCOUNTER — Ambulatory Visit (INDEPENDENT_AMBULATORY_CARE_PROVIDER_SITE_OTHER): Payer: Managed Care, Other (non HMO) | Admitting: Physician Assistant

## 2015-06-23 VITALS — BP 110/70 | HR 89 | Temp 97.5°F | Resp 16 | Ht 64.0 in | Wt 184.0 lb

## 2015-06-23 DIAGNOSIS — G43809 Other migraine, not intractable, without status migrainosus: Secondary | ICD-10-CM

## 2015-06-23 DIAGNOSIS — F411 Generalized anxiety disorder: Secondary | ICD-10-CM | POA: Diagnosis not present

## 2015-06-23 DIAGNOSIS — M26609 Unspecified temporomandibular joint disorder, unspecified side: Secondary | ICD-10-CM

## 2015-06-23 MED ORDER — ALPRAZOLAM ER 1 MG PO TB24: 1.0000 mg | ORAL_TABLET | Freq: Every day | ORAL | Status: DC

## 2015-06-23 NOTE — Progress Notes (Signed)
Assessment and Plan: Anxiety- continue medications- try xanax 1mg  XR #30 NR, follow up as needed  HPI 35 y.o.female presents for 1 month follow up for migraines and depression. She was started on wellbutrin, ketoprofen, topamax and verapamil. She states she feels she is doing better with her son, migraines are doing better, but she still has stress at work. She is working on trying to change jobs within [employer]. Will get so anxious at work, she wants to miss it, gets anxious Sunday night with the thought of work. 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  BMI is Body mass index is 31.57 kg/(m^2)., she is working on diet and exercise. Wt Readings from Last 3 Encounters:  06/23/15 184 lb (83.462 kg)  05/20/15 182 lb (82.555 kg)  12/22/14 195 lb (88.451 kg)     Past Medical History  Diagnosis Date  . Migraines   . 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 buPROPion (WELLBUTRIN XL) 150 MG 24 hr tablet Take 1 tablet (150 mg total) by mouth daily. 30 tablet 1  . citalopram (CELEXA) 20 MG tablet TAKE ONE TABLET BY MOUTH ONE TIME DAILY 90 tablet 01  . cyclobenzaprine (FLEXERIL) 10 MG tablet 1 pill at night as needed for headahce 30 tablet 1  . drospirenone-ethinyl estradiol (OCELLA) 3-0.03 MG tablet Take 1 tablet by mouth daily. 4 Package 4  . ketoprofen (ORUDIS) 50 MG capsule Take 1 capsule (50 mg total) by mouth 4 (four) times daily as needed. 30 capsule 2  . topiramate (TOPAMAX) 100 MG tablet TAKE 2 TABLETS BY MOUTH TWICE A DAY **MAX QTY 100 PER INS** 120 tablet 1  . verapamil (CALAN-SR) 180 MG CR tablet Take 1 tablet (180 mg total) by mouth at bedtime. 30 tablet 11  . Vitamin D, Ergocalciferol, (DRISDOL) 50000 units CAPS capsule 1 pill 4 days a week 30 capsule 1   No current facility-administered medications on file prior to visit.    ROS: all negative  except above.   Physical Exam: Filed Weights   06/23/15 1601  Weight: 184 lb (83.462 kg)   BP 110/70 mmHg  Pulse 89  Temp(Src) 97.5 F (36.4 C) (Temporal)  Resp 16  Ht 5\' 4"  (1.626 m)  Wt 184 lb (83.462 kg)  BMI 31.57 kg/m2  SpO2 99% General Appearance: Well nourished, in no apparent distress. Eyes: PERRLA, EOMs, conjunctiva no swelling or erythema Sinuses: No Frontal/maxillary tenderness ENT/Mouth: Ext aud canals clear, TMs without erythema, bulging. No erythema, swelling, or exudate on post pharynx.  Tonsils not swollen or erythematous. Hearing normal.  Neck: Supple, thyroid normal.  Respiratory: Respiratory effort normal, BS equal bilaterally without rales, rhonchi, wheezing or stridor.  Cardio: RRR with no MRGs. Brisk peripheral pulses without edema.  Abdomen: Soft, + BS.  Non tender, no guarding, rebound, hernias, masses. Lymphatics: Non tender without lymphadenopathy.  Musculoskeletal: Full ROM, 5/5 strength, normal gait.  Skin: Warm, dry without rashes, lesions, ecchymosis.  Neuro: Cranial nerves intact. Normal muscle tone, no cerebellar symptoms. Sensation intact.  Psych: Awake and oriented X 3, normal affect, Insight and Judgment appropriate.     Vicie Mutters, PA-C 4:17 PM [facility]

## 2015-06-23 NOTE — Patient Instructions (Signed)
Generalized Anxiety Disorder Generalized anxiety disorder (GAD) is a mental disorder. It interferes with life functions, including relationships, work, and school. GAD is different from normal anxiety, which everyone experiences at some point in their lives in response to specific life events and activities. Normal anxiety actually helps [place] prepare for and get through these life events and activities. Normal anxiety goes away after the event or activity is over.  GAD causes anxiety that is not necessarily related to specific events or activities. It also causes excess anxiety in proportion to specific events or activities. The anxiety associated with GAD is also difficult to control. GAD can vary from mild to severe. People with severe GAD can have intense waves of anxiety with physical symptoms (panic attacks).  SYMPTOMS The anxiety and worry associated with GAD are difficult to control. This anxiety and worry are related to many life events and activities and also occur more days than not for 6 months or longer. People with GAD also have three or more of the following symptoms (one or more in children):  Restlessness.   Fatigue.  Difficulty concentrating.   Irritability.  Muscle tension.  Difficulty sleeping or unsatisfying sleep. DIAGNOSIS GAD is diagnosed through an assessment by your health care provider. Your health care provider will ask you questions aboutyour mood,physical symptoms, and events in your life. Your health care provider may ask you about your medical history and use of alcohol or drugs, including prescription medicines. Your health care provider may also do a physical exam and blood tests. Certain medical conditions and the use of certain substances can cause symptoms similar to those associated with GAD. Your health care provider may refer you to a mental health specialist for further evaluation. TREATMENT The following therapies are usually used to treat GAD:    Medication. Antidepressant medication usually is prescribed for long-term daily control. Antianxiety medicines may be added in severe cases, especially when panic attacks occur.   Talk therapy (psychotherapy). Certain types of talk therapy can be helpful in treating GAD by providing support, education, and guidance. A form of talk therapy called cognitive behavioral therapy can teach you healthy ways to think about and react to daily life events and activities.  Stress managementtechniques. These include yoga, meditation, and exercise and can be very helpful when they are practiced regularly. A mental health specialist can help determine which treatment is best for you. Some people see improvement with one therapy. However, other people require a combination of therapies.   This information is not intended to replace advice given to you by your health care provider. Make sure you discuss any questions you have with your health care provider.  What is the TMJ? The temporomandibular (tem-PUH-ro-man-DIB-yoo-ler) joint, or the TMJ, connects the upper and lower jawbones. This joint allows the jaw to open wide and move back and forth when you chew, talk, or yawn.There are also several muscles that help this joint move. There can be muscle tightness and pain in the muscle that can cause several symptoms.  What causes TMJ pain? There are many causes of TMJ pain. Repeated chewing (for example, chewing gum) and clenching your teeth can cause pain in the joint. Some TMJ pain has no obvious cause. What can I do to ease the pain? There are many things you can do to help your pain get better. When you have pain:  Eat soft foods and stay away from chewy foods (for example, taffy) Try to use both sides of your mouth to  chew Don't chew gum Massage Don't open your mouth wide (for example, during yawning or singing) Don't bite your cheeks or fingernails Lower your amount of stress and worry Applying a  warm, damp washcloth to the joint may help. Over-the-counter pain medicines such as ibuprofen (one brand: Advil) or acetaminophen (one brand: Tylenol) might also help. Do not use these medicines if you are allergic to them or if your doctor told you not to use them. How can I stop the pain from coming back? When your pain is better, you can do these exercises to make your muscles stronger and to keep the pain from coming back:  Resisted mouth opening: Place your thumb or two fingers under your chin and open your mouth slowly, pushing up lightly on your chin with your thumb. Hold for three to six seconds. Close your mouth slowly. Resisted mouth closing: Place your thumbs under your chin and your two index fingers on the ridge between your mouth and the bottom of your chin. Push down lightly on your chin as you close your mouth. Tongue up: Slowly open and close your mouth while keeping the tongue touching the roof of the mouth. Side-to-side jaw movement: Place an object about one fourth of an inch thick (for example, two tongue depressors) between your front teeth. Slowly move your jaw from side to side. Increase the thickness of the object as the exercise becomes easier Forward jaw movement: Place an object about one fourth of an inch thick between your front teeth and move the bottom jaw forward so that the bottom teeth are in front of the top teeth. Increase the thickness of the object as the exercise becomes easier. These exercises should not be painful. If it hurts to do these exercises, stop doing them and talk to your family doctor.     Document Released: 08/05/2012 Document Revised: 05/01/2014 Document Reviewed: 08/05/2012 Elsevier Interactive Patient Education [employer].

## 2015-07-21 ENCOUNTER — Other Ambulatory Visit: Payer: Self-pay | Admitting: Physician Assistant

## 2015-09-28 ENCOUNTER — Encounter: Payer: Self-pay | Admitting: Physician Assistant

## 2015-09-28 ENCOUNTER — Ambulatory Visit (INDEPENDENT_AMBULATORY_CARE_PROVIDER_SITE_OTHER): Payer: Managed Care, Other (non HMO) | Admitting: Physician Assistant

## 2015-09-28 VITALS — BP 122/78 | HR 90 | Temp 98.2°F | Resp 16 | Ht 64.0 in | Wt 196.0 lb

## 2015-09-28 DIAGNOSIS — F32A Depression, unspecified: Secondary | ICD-10-CM

## 2015-09-28 DIAGNOSIS — F329 Major depressive disorder, single episode, unspecified: Secondary | ICD-10-CM | POA: Diagnosis not present

## 2015-09-28 DIAGNOSIS — I10 Essential (primary) hypertension: Secondary | ICD-10-CM

## 2015-09-28 DIAGNOSIS — E669 Obesity, unspecified: Secondary | ICD-10-CM | POA: Diagnosis not present

## 2015-09-28 DIAGNOSIS — G43809 Other migraine, not intractable, without status migrainosus: Secondary | ICD-10-CM | POA: Diagnosis not present

## 2015-09-28 MED ORDER — CYCLOBENZAPRINE HCL 10 MG PO TABS: ORAL_TABLET | ORAL | Status: DC

## 2015-09-28 MED ORDER — PHENTERMINE HCL 37.5 MG PO TABS: 37.5000 mg | ORAL_TABLET | Freq: Every day | ORAL | Status: DC

## 2015-09-28 MED ORDER — ALPRAZOLAM ER 1 MG PO TB24: 1.0000 mg | ORAL_TABLET | Freq: Every day | ORAL | Status: DC

## 2015-09-28 MED ORDER — BUPROPION HCL ER (XL) 300 MG PO TB24: 300.0000 mg | ORAL_TABLET | Freq: Every day | ORAL | Status: DC

## 2015-09-28 MED ORDER — ALUMINUM CHLORIDE 20 % EX SOLN: Freq: Every day | CUTANEOUS | Status: DC

## 2015-09-28 NOTE — Patient Instructions (Addendum)
     Go to [employer]  .Counseling services- want cognitive behavior therapy  [place].  Address: [place], [place] [address] [facility] [phone] office [email or web address] [address], [place], [place], [place] [address]  Rema Fendt, therapist  Toy Cookey, MA, clinical psychologist  Cognitive-Behavior Therapy; Mood Disorders; Anxiety Disorders; adult and child ADHD; Family Therapy; Stress Management; personal growth, and Marital Therapy.    Terrance Mass Ph.D., clinical psychologist Cognitive-Behavior Therapy; Mood Disorders; Anxiety Disorders; Stress     Management  [facility] [address], [place], [place] [address] [phone]   [facility], psychotherapist [address] [place], [place] [address] [phone]  Karin Golden Ph.D., clinical psychologist [phone] office [place], [place] [address] Cognitive Behavior Therapy, Depression, Bipolar, Anxiety, Grief and Loss

## 2015-09-28 NOTE — Progress Notes (Signed)
Assessment and Plan:  1. Hypertension -Continue medication, monitor blood pressure at home. Continue DASH diet.  Reminder to go to the ER if any CP, SOB, nausea, dizziness, severe HA, changes vision/speech, left arm numbness and tingling and jaw pain.  2. Cholesterol -Continue diet and exercise. Check cholesterol.   3. Obesity  long discussion about weight loss, diet, and exercise Will on phentermine, follow up 1 month  4. Vitamin D Def - check level and continue medications.   5. Migraines, Continue topamax  200mg , continue verapamil, refill flexeril 10mg  at night, talk with dentist.  ketoprofen  6. Depression Increase wellbutrin to 300mg  daily, stop celexa, if this does not help will do zoloft  7. Sweaty palms drysol   Continue diet and meds as discussed. Further disposition pending results of labs. Over 30 minutes of exam, counseling, chart review, and critical decision making was performed Labs done at [facility]  HPI 35 y.o. female  presents for 3 month follow up on hypertension, cholesterol, prediabetes, and vitamin D deficiency.   Her blood pressure has been controlled at home, today their BP is BP: 122/78 mmHg  She is on verapamil and topamax for migraines which is helping.   She does workout. She denies chest pain, shortness of breath, dizziness.  She is not on cholesterol medication and denies myalgias. Her cholesterol is at goal. The cholesterol last visit was:   Lab Results  Component Value Date   CHOL 180 07/06/2014    She has been working on diet and exercise for prediabetes, and denies paresthesia of the feet, polydipsia, polyuria and visual disturbances. Last A1C in the office was:  Lab Results  Component Value Date   HGBA1C 5.6 07/06/2014   Patient is on Vitamin D supplement, she is on vitamin D 50,000 once a week for 3 months but then stopped.  Lab Results  Component Value Date   VD25OH 27.3* 12/14/2014     BMI is Body mass index is 33.63 kg/(m^2).,  she is working on diet and exercise. She openly admits to increased eating with stress and will drink soda.   She still hates her job, has increased, her husband has two jobs and is gone a lot. Son is now toddle and talking.  Wt Readings from Last 3 Encounters:  09/28/15 196 lb (88.905 kg)  06/23/15 184 lb (83.462 kg)  05/20/15 182 lb (82.555 kg)     Current Medications:  Current Outpatient Prescriptions on File Prior to Visit  Medication Sig Dispense Refill  . ALPRAZolam (XANAX XR) 1 MG 24 hr tablet Take 1 tablet (1 mg total) by mouth daily. 30 tablet 0  . buPROPion (WELLBUTRIN XL) 150 MG 24 hr tablet Take 1 tablet (150 mg total) by mouth daily. 30 tablet 1  . citalopram (CELEXA) 20 MG tablet TAKE ONE TABLET BY MOUTH ONE TIME DAILY 90 tablet 01  . cyclobenzaprine (FLEXERIL) 10 MG tablet 1 pill at night as needed for headahce 30 tablet 1  . ketoprofen (ORUDIS) 50 MG capsule Take 1 capsule (50 mg total) by mouth 4 (four) times daily as needed. 30 capsule 2  . Multiple Vitamin (MULTIVITAMIN) tablet Take 1 tablet by mouth daily.    . phentermine (ADIPEX-P) 37.5 MG tablet TAKE 1 TABLET EVERY DAY BEFORE BREAKFAST  2  . topiramate (TOPAMAX) 100 MG tablet TAKE 2 TABLETS BY MOUTH TWICE A DAY **MAX QTY 100 PER INS** 120 tablet 1  . verapamil (CALAN-SR) 180 MG CR tablet TAKE ONE TABLET BY  MOUTH NIGHTLY AT BEDTIME 90 tablet 1  . Vitamin D, Ergocalciferol, (DRISDOL) 50000 units CAPS capsule 1 pill 4 days a week 30 capsule 1   No current facility-administered medications on file prior to visit.   Medical History:  Past Medical History  Diagnosis Date  . Migraines   . 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  Negative for fever, chills, weight loss and diaphoresis.  HENT: Negative for congestion, ear discharge, ear pain, hearing loss, nosebleeds, sore throat and tinnitus.    Eyes: Negative.   Respiratory: Negative.  Negative for stridor.   Cardiovascular: Negative.   Gastrointestinal: Negative.  Negative for heartburn, nausea, vomiting, abdominal pain, diarrhea, constipation, blood in stool and melena.  Genitourinary: Negative.  Negative for dysuria, urgency, frequency, hematuria and flank pain.  Musculoskeletal: Negative.  Negative for myalgias, back pain, joint pain, falls and neck pain.  Skin: Negative.   Neurological: Positive for headaches. Negative for dizziness, tingling, tremors, sensory change, speech change, focal weakness, seizures, loss of consciousness and weakness.  Endo/Heme/Allergies: Negative.   Psychiatric/Behavioral: Positive for depression. Negative for suicidal ideas, hallucinations, memory loss and substance abuse. The patient has insomnia.     Family history- Review and unchanged Social history- Review and unchanged Physical Exam: BP 122/78 mmHg  Pulse 90  Temp(Src) 98.2 F (36.8 C) (Temporal)  Resp 16  Ht 5\' 4"  (1.626 m)  Wt 196 lb (88.905 kg)  BMI 33.63 kg/m2  LMP 09/20/2015 Wt Readings from Last 3 Encounters:  09/28/15 196 lb (88.905 kg)  06/23/15 184 lb (83.462 kg)  05/20/15 182 lb (82.555 kg)   General Appearance: Well nourished, in no apparent distress. Eyes: PERRLA, EOMs, conjunctiva no swelling or erythema Sinuses: No Frontal/maxillary tenderness ENT/Mouth: Ext aud canals clear, TMs without erythema, bulging. No erythema, swelling, or exudate on post pharynx.  Tonsils not swollen or erythematous. Hearing normal.  Neck: Supple, thyroid normal.  Respiratory: Respiratory effort normal, BS equal bilaterally without rales, rhonchi, wheezing or stridor.  Cardio: RRR with no MRGs. Brisk peripheral pulses without edema.  Abdomen: Soft, + BS,  Non tender, no guarding, rebound, hernias, masses. Lymphatics: Non tender without lymphadenopathy.  Musculoskeletal: Full ROM, 5/5 strength, Normal gait Skin: Warm, dry without rashes,  lesions, ecchymosis.  Neuro: Cranial nerves intact. Normal muscle tone, no cerebellar symptoms. Psych: Awake and oriented X 3, normal affect, Insight and Judgment appropriate.    Vicie Mutters, PA-C 4:45 PM [facility]

## 2015-10-19 ENCOUNTER — Encounter: Payer: Self-pay | Admitting: Physician Assistant

## 2015-11-02 ENCOUNTER — Ambulatory Visit: Payer: Self-pay | Admitting: Physician Assistant

## 2015-11-09 ENCOUNTER — Encounter: Payer: Self-pay | Admitting: Physician Assistant

## 2015-11-09 ENCOUNTER — Ambulatory Visit (INDEPENDENT_AMBULATORY_CARE_PROVIDER_SITE_OTHER): Payer: Managed Care, Other (non HMO) | Admitting: Physician Assistant

## 2015-11-09 VITALS — BP 120/100 | HR 118 | Temp 97.2°F | Resp 16 | Ht 64.0 in | Wt 199.8 lb

## 2015-11-09 DIAGNOSIS — F32A Depression, unspecified: Secondary | ICD-10-CM

## 2015-11-09 DIAGNOSIS — G43809 Other migraine, not intractable, without status migrainosus: Secondary | ICD-10-CM | POA: Diagnosis not present

## 2015-11-09 DIAGNOSIS — E669 Obesity, unspecified: Secondary | ICD-10-CM

## 2015-11-09 DIAGNOSIS — F329 Major depressive disorder, single episode, unspecified: Secondary | ICD-10-CM

## 2015-11-09 NOTE — Patient Instructions (Addendum)
For the topamax taper 1) start topamax 1.5 pills at night x 2-3 days 2) then 1 pill at night for 2-3 days 3)  Then do 1/2 a pill until you run out.   If your migraines come back we can increase your verapamil OR we can stop/decrease wellbutrin and add on effexor for migraines and anxiety  Stop the phentermine if it is not helping, can retry in 1-3 months or can try contrave  Continue the wellbutrin  Strongly suggest counseling  Can switch vitamin D to 5000 IU from [employer] and take daily Stop the vitamin D 50,000  Counseling services  [facility] Hours: Monday-Thursday 830-8pm  Friday 830AM-7PM Address: [place] [phone] [place].  Address: [place], [place] [address] [facility] [phone] office [email or web address] [address], [place], [place], [place] [address]  Rema Fendt, therapist  Toy Cookey, MA, clinical psychologist  Cognitive-Behavior Therapy; Mood Disorders; Anxiety Disorders; adult and child ADHD; Family Therapy; Stress Management; personal growth, and Marital Therapy.    Terrance Mass Ph.D., clinical psychologist Cognitive-Behavior Therapy; Mood Disorders; Anxiety Disorders; Stress     Management  [facility] [address], [place], [place] [address] [phone]   [facility], psychotherapist [address] [place], [place] [address] [phone]  Karin Golden Ph.D., clinical psychologist [phone] office [place], [place] [address] Cognitive Behavior Therapy, Depression, Bipolar, Anxiety, Grief and Loss

## 2015-11-09 NOTE — Progress Notes (Signed)
Assessment and Plan: Migraine- stop topamax, will increase verapamil or decrease wellbutrin and add on effexor.  Obesity- work on diet/healthy exercise Anxiety- continue wellbutrin and strongly suggest counseling.   No future appointments.   HPI 35 y.o.female presents for 1 month follow up for depression/obesity/migraines.  BMI is Body mass index is 34.28 kg/(m^2)., she is working on diet and exercise, she was started on phentermine but it has not been helping. She would like to taper off the topamax due to memory issues and cost, we will start to taper off of that and we will hope the verapamil will help with migraine prevention and increase as needed. She states the wellbutrin is helping but she takes it at night.  Wt Readings from Last 3 Encounters:  11/09/15 199 lb 12.8 oz (90.629 kg)  09/28/15 196 lb (88.905 kg)  06/23/15 184 lb (83.462 kg)    Past Medical History  Diagnosis Date  . Migraines   . 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 ALPRAZolam (XANAX XR) 1 MG 24 hr tablet Take 1 tablet (1 mg total) by mouth daily. 30 tablet 0  . aluminum chloride (DRYSOL) 20 % external solution Apply topically at bedtime. 35 mL 0  . buPROPion (WELLBUTRIN XL) 300 MG 24 hr tablet Take 1 tablet (300 mg total) by mouth daily. 30 tablet 3  . cyclobenzaprine (FLEXERIL) 10 MG tablet 1 pill at night as needed for headahce 30 tablet 3  . ketoprofen (ORUDIS) 50 MG capsule Take 1 capsule (50 mg total) by mouth 4 (four) times daily as needed. 30 capsule 2  . Multiple Vitamin (MULTIVITAMIN) tablet Take 1 tablet by mouth daily.    . phentermine (ADIPEX-P) 37.5 MG tablet Take 1 tablet (37.5 mg total) by mouth daily before breakfast. 30 tablet 2  . topiramate (TOPAMAX) 100 MG tablet TAKE 2 TABLETS BY MOUTH TWICE A DAY **MAX QTY 100 PER INS** 120 tablet 1  . verapamil  (CALAN-SR) 180 MG CR tablet TAKE ONE TABLET BY MOUTH NIGHTLY AT BEDTIME 90 tablet 1  . Vitamin D, Ergocalciferol, (DRISDOL) 50000 units CAPS capsule 1 pill 4 days a week 30 capsule 1   No current facility-administered medications on file prior to visit.    ROS: all negative except above.   Physical Exam: Filed Weights   11/09/15 1605  Weight: 199 lb 12.8 oz (90.629 kg)   BP 120/100 mmHg  Pulse 118  Temp(Src) 97.2 F (36.2 C) (Temporal)  Resp 16  Ht 5\' 4"  (1.626 m)  Wt 199 lb 12.8 oz (90.629 kg)  BMI 34.28 kg/m2  SpO2 98%  LMP 11/03/2015 General Appearance: Well nourished, in no apparent distress. Eyes: PERRLA, EOMs, conjunctiva no swelling or erythema Sinuses: No Frontal/maxillary tenderness ENT/Mouth: Ext aud canals clear, TMs without erythema, bulging. No erythema, swelling, or exudate on post pharynx.  Tonsils not swollen or erythematous. Hearing normal.  Neck: Supple, thyroid normal.  Respiratory: Respiratory effort normal, BS equal bilaterally without rales, rhonchi, wheezing or stridor.  Cardio: RRR with no MRGs. Brisk peripheral pulses without edema.  Abdomen: Soft, + BS.  Non tender, no guarding, rebound, hernias, masses. Lymphatics: Non tender without lymphadenopathy.  Musculoskeletal: Full ROM, 5/5 strength, normal gait.  Skin: Warm, dry without rashes, lesions, ecchymosis.  Neuro: Cranial nerves intact. Normal muscle tone, no cerebellar symptoms. Sensation intact.  Psych:  Awake and oriented X 3, normal affect, Insight and Judgment appropriate.     Vicie Mutters, PA-C 4:12 PM [facility]

## 2015-11-16 ENCOUNTER — Encounter: Payer: Self-pay | Admitting: Physician Assistant

## 2015-11-16 MED ORDER — NALTREXONE-BUPROPION HCL ER 8-90 MG PO TB12: ORAL_TABLET | ORAL | 3 refills | Status: DC

## 2015-11-16 MED ORDER — DROSPIRENONE-ETHINYL ESTRADIOL 3-0.03 MG PO TABS: 1.0000 | ORAL_TABLET | Freq: Every day | ORAL | 4 refills | Status: DC

## 2015-11-26 ENCOUNTER — Telehealth: Payer: Self-pay

## 2015-11-26 NOTE — Telephone Encounter (Signed)
INFORMED PT THAT HER INSURANCE WOULD NOT COVER HER RX BUT SHE COULD TRY TO GET @ A LOWER PRICE W/ CARD.

## 2016-02-09 ENCOUNTER — Encounter: Payer: Self-pay | Admitting: Physician Assistant

## 2016-02-09 ENCOUNTER — Ambulatory Visit (INDEPENDENT_AMBULATORY_CARE_PROVIDER_SITE_OTHER): Payer: Managed Care, Other (non HMO) | Admitting: Physician Assistant

## 2016-02-09 VITALS — BP 130/100 | HR 86 | Temp 97.7°F | Resp 16 | Ht 64.0 in | Wt 206.2 lb

## 2016-02-09 DIAGNOSIS — I10 Essential (primary) hypertension: Secondary | ICD-10-CM

## 2016-02-09 DIAGNOSIS — F329 Major depressive disorder, single episode, unspecified: Secondary | ICD-10-CM

## 2016-02-09 DIAGNOSIS — G43809 Other migraine, not intractable, without status migrainosus: Secondary | ICD-10-CM

## 2016-02-09 DIAGNOSIS — F32A Depression, unspecified: Secondary | ICD-10-CM

## 2016-02-09 DIAGNOSIS — M26609 Unspecified temporomandibular joint disorder, unspecified side: Secondary | ICD-10-CM

## 2016-02-09 LAB — CBC WITH DIFFERENTIAL/PLATELET
BASOS ABS: 90 {cells}/uL (ref 0–200)
Basophils Relative: 1 %
EOS PCT: 2 %
Eosinophils Absolute: 180 cells/uL (ref 15–500)
HCT: 39.3 % (ref 35.0–45.0)
HEMOGLOBIN: 13.2 g/dL (ref 11.7–15.5)
LYMPHS PCT: 38 %
Lymphs Abs: 3420 cells/uL (ref 850–3900)
MCH: 30 pg (ref 27.0–33.0)
MCHC: 33.6 g/dL (ref 32.0–36.0)
MCV: 89.3 fL (ref 80.0–100.0)
MPV: 10.4 fL (ref 7.5–12.5)
Monocytes Absolute: 810 cells/uL (ref 200–950)
Monocytes Relative: 9 %
NEUTROS PCT: 50 %
Neutro Abs: 4500 cells/uL (ref 1500–7800)
Platelets: 334 10*3/uL (ref 140–400)
RBC: 4.4 MIL/uL (ref 3.80–5.10)
RDW: 12.7 % (ref 11.0–15.0)
WBC: 9 10*3/uL (ref 3.8–10.8)

## 2016-02-09 LAB — TSH: TSH: 1.22 m[IU]/L

## 2016-02-09 MED ORDER — VERAPAMIL HCL ER 240 MG PO TBCR: 240.0000 mg | EXTENDED_RELEASE_TABLET | Freq: Every day | ORAL | 3 refills | Status: DC

## 2016-02-09 MED ORDER — DROSPIRENONE-ETHINYL ESTRADIOL 3-0.03 MG PO TABS: 1.0000 | ORAL_TABLET | Freq: Every day | ORAL | 4 refills | Status: DC

## 2016-02-09 MED ORDER — LORCASERIN HCL 10 MG PO TABS: ORAL_TABLET | ORAL | 3 refills | Status: DC

## 2016-02-09 MED ORDER — CYCLOBENZAPRINE HCL 10 MG PO TABS: ORAL_TABLET | ORAL | 3 refills | Status: DC

## 2016-02-09 MED ORDER — VENLAFAXINE HCL ER 37.5 MG PO CP24: ORAL_CAPSULE | ORAL | 0 refills | Status: DC

## 2016-02-09 NOTE — Patient Instructions (Addendum)
Increase verapamil from 180 to 240 x 1-2 weeks.  And cut wellbutrin in half x 1 week to 150mg   At this time if you are still having dizziness stop the wellbutrin all together to see if this helps Then can start effexor 37.5mg  for headahces/anxiety for 1-2 weeks, then can take 2 pills a day which is 75 mg and the main dose.   After 2-4 weeks and these changes can try the belviq.   Magnesium low add 250 mg with food to prevent diarrhea. Magnesium may help with muscle cramps, constipation, vitamin D and potassium absorption.  CAN ALSO HELP migraine prevention     Vitamin D goal is between 60-80  Please make sure that you are taking your Vitamin D as directed.   It is very important as a natural anti-inflammatory   helping hair, skin, and nails, as well as reducing stroke and heart attack risk.   It helps your bones and helps with mood.  It also decreases numerous cancer risks so please take it as directed.   Low Vit D is associated with a 200-300% higher risk for CANCER   and 200-300% higher risk for HEART   ATTACK  &  STROKE.    .....................................Marland Kitchen  It is also associated with higher death rate at younger ages,   autoimmune diseases like Rheumatoid arthritis, Lupus, Multiple Sclerosis.     Also many other serious conditions, like depression, Alzheimer's  Dementia, infertility, muscle aches, fatigue, fibromyalgia - just to name a few.  +++++++++++++++++++  Can get liquid vitamin D from [facility] here in [place] at  [facility] alternatives [address] [place], [place] [address]  Get on 5000 IU a day

## 2016-02-09 NOTE — Progress Notes (Signed)
Assessment and Plan: Migraine- normal neuro, will increase verapamil and decrease wellbutrin and add on effexor PRN.  Obesity- work on diet/healthy exercise, phentermine did not help, contrave could not get it with insurance, will try belviq.  Hypertension- increase verapamil, follow up 1 month Anxiety- cut wellbutrin in half, and start low dose effexor, and then stop wellbutrin and increase to 2 tablets and strongly suggest counseling.   No future appointments.  Follow up for CPE   HPI 35 y.o.female presents for 3 month follow up for depression/obesity/migraines.  BMI is Body mass index is 35.39 kg/m., she is working on diet and exercise, has tried phentermine but it did not help. She was tapered off topamax due to memory issues, still on verapamil however headaches are worse now, now getting 5/7 days, will take aleve daily, ran out of flexeril to take at night.  She states the wellbutrin is not helping.  She has had dizziness later in the afternoon, worse with movement/turning head, no changes in vision, no CP, SOB, irreg heart beats.  She also has witnessed apnea events, husband has woken her up, she had HA, dry mouth, fatigue. Would like apnea link.  Wt Readings from Last 3 Encounters:  02/09/16 206 lb 3.2 oz (93.5 kg)  11/09/15 199 lb 12.8 oz (90.6 kg)  09/28/15 196 lb (88.9 kg)    Past Medical History:  Diagnosis Date  . 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 Migraines      No Known Allergies    Current Outpatient Prescriptions on File Prior to Visit  Medication Sig Dispense Refill  . ALPRAZolam (XANAX XR) 1 MG 24 hr tablet Take 1 tablet (1 mg total) by mouth daily. 30 tablet 0  . buPROPion (WELLBUTRIN XL) 300 MG 24 hr tablet Take 1 tablet (300 mg total) by mouth daily. 30 tablet 3  . drospirenone-ethinyl estradiol (OCELLA) 3-0.03 MG tablet Take 1 tablet by mouth daily. 4 Package 4  . ketoprofen (ORUDIS) 50 MG capsule Take 1 capsule (50 mg total) by  mouth 4 (four) times daily as needed. 30 capsule 2  . Multiple Vitamin (MULTIVITAMIN) tablet Take 1 tablet by mouth daily.    . verapamil (CALAN-SR) 180 MG CR tablet TAKE ONE TABLET BY MOUTH NIGHTLY AT BEDTIME 90 tablet 1   No current facility-administered medications on file prior to visit.     ROS: all negative except above.   Physical Exam: Filed Weights   02/09/16 1521  Weight: 206 lb 3.2 oz (93.5 kg)   BP (!) 130/100   Pulse 86   Temp 97.7 F (36.5 C)   Resp 16   Ht 5\' 4"  (1.626 m)   Wt 206 lb 3.2 oz (93.5 kg)   LMP 02/05/2016   SpO2 98%   BMI 35.39 kg/m  General Appearance: Well nourished, in no apparent distress. Eyes: PERRLA, EOMs, conjunctiva no swelling or erythema Sinuses: No Frontal/maxillary tenderness ENT/Mouth: Ext aud canals clear, TMs without erythema, bulging. No erythema, swelling, or exudate on post pharynx.  Tonsils not swollen or erythematous. Hearing normal.  Neck: Supple, thyroid normal.  Respiratory: Respiratory effort normal, BS equal bilaterally without rales, rhonchi, wheezing or stridor.  Cardio: RRR with no MRGs. Brisk peripheral pulses without edema.  Abdomen: Soft, + BS.  Non tender, no guarding, rebound, hernias, masses. Lymphatics: Non tender without lymphadenopathy.  Musculoskeletal: Full ROM, 5/5 strength, normal gait.  Skin: Warm, dry without rashes, lesions, ecchymosis.  Neuro: Cranial nerves intact. Normal muscle  tone, no cerebellar symptoms. Sensation intact.  Psych: Awake and oriented X 3, normal affect, Insight and Judgment appropriate.     Vicie Mutters, PA-C 3:30 PM [facility]

## 2016-02-10 LAB — BASIC METABOLIC PANEL WITH GFR
BUN: 8 mg/dL (ref 7–25)
CALCIUM: 9.3 mg/dL (ref 8.6–10.2)
CO2: 23 mmol/L (ref 20–31)
CREATININE: 0.73 mg/dL (ref 0.50–1.10)
Chloride: 105 mmol/L (ref 98–110)
GFR, Est African American: >89 mL/min (ref >=60)
GLUCOSE: 95 mg/dL (ref 65–99)
Potassium: 4.1 mmol/L (ref 3.5–5.3)
SODIUM: 138 mmol/L (ref 135–146)

## 2016-02-10 LAB — HEPATIC FUNCTION PANEL
ALT: 11 U/L (ref 6–29)
AST: 13 U/L (ref 10–30)
Albumin: 4.2 g/dL (ref 3.6–5.1)
Alkaline Phosphatase: 32 U/L — ABNORMAL LOW (ref 33–115)
BILIRUBIN INDIRECT: 0.2 mg/dL (ref 0.2–1.2)
Bilirubin, Direct: 0.1 mg/dL (ref <=0.2)
TOTAL PROTEIN: 7.5 g/dL (ref 6.1–8.1)
Total Bilirubin: 0.3 mg/dL (ref 0.2–1.2)

## 2016-02-11 ENCOUNTER — Encounter: Payer: Self-pay | Admitting: Physician Assistant

## 2016-02-13 ENCOUNTER — Other Ambulatory Visit: Payer: Self-pay | Admitting: Physician Assistant

## 2016-02-13 MED ORDER — VERAPAMIL HCL ER 240 MG PO TBCR: 240.0000 mg | EXTENDED_RELEASE_TABLET | Freq: Every day | ORAL | 3 refills | Status: DC

## 2016-02-13 MED ORDER — VENLAFAXINE HCL ER 37.5 MG PO CP24: ORAL_CAPSULE | ORAL | 0 refills | Status: DC

## 2016-02-14 ENCOUNTER — Other Ambulatory Visit: Payer: Self-pay | Admitting: Physician Assistant

## 2016-02-14 MED ORDER — PHENTERMINE HCL 37.5 MG PO TABS: 37.5000 mg | ORAL_TABLET | Freq: Every day | ORAL | 2 refills | Status: DC

## 2016-02-15 ENCOUNTER — Other Ambulatory Visit: Payer: Self-pay | Admitting: Physician Assistant

## 2016-02-15 MED ORDER — VENLAFAXINE HCL ER 37.5 MG PO CP24: ORAL_CAPSULE | ORAL | 0 refills | Status: DC

## 2016-03-21 ENCOUNTER — Other Ambulatory Visit: Payer: Self-pay | Admitting: Physician Assistant

## 2016-05-17 ENCOUNTER — Encounter: Payer: Self-pay | Admitting: Internal Medicine

## 2016-05-17 ENCOUNTER — Ambulatory Visit (INDEPENDENT_AMBULATORY_CARE_PROVIDER_SITE_OTHER): Payer: Managed Care, Other (non HMO) | Admitting: Internal Medicine

## 2016-05-17 VITALS — BP 126/84 | HR 100 | Temp 98.2°F | Resp 18 | Ht 64.0 in | Wt 195.0 lb

## 2016-05-17 DIAGNOSIS — B9789 Other viral agents as the cause of diseases classified elsewhere: Secondary | ICD-10-CM

## 2016-05-17 DIAGNOSIS — J069 Acute upper respiratory infection, unspecified: Secondary | ICD-10-CM

## 2016-05-17 MED ORDER — PROMETHAZINE-DM 6.25-15 MG/5ML PO SYRP: ORAL_SOLUTION | ORAL | 1 refills | Status: DC

## 2016-05-17 MED ORDER — PREDNISONE 20 MG PO TABS: ORAL_TABLET | ORAL | 0 refills | Status: DC

## 2016-05-17 MED ORDER — FLUTICASONE PROPIONATE 50 MCG/ACT NA SUSP: 2.0000 | Freq: Every day | NASAL | 0 refills | Status: DC

## 2016-05-17 NOTE — Patient Instructions (Signed)
Please take 10 mg of cetirizine or zyrtec daily.  Please take 25-50 mg of benadryl daily.  Please use flonase 2 sprays each nostril right before bedtime.    Please take prednisone with your breakfast.    Please take the cough syrup 3 times a day as needed.  Please take sudafed tablets every 6-8 hours as needed to help with congestion.

## 2016-05-17 NOTE — Progress Notes (Signed)
HPI  Patient presents to the office for evaluation of cough.  It has been going on for 1 weeks.  Patient reports night > day, wet, barky, cough with yellow sputum production.  They also endorse change in voice, postnasal drip and nasal congestion, clear rhinorrhea, ear congestion, headaches, sore throat.  .  They have tried zpak, aleve, mucinex, nyquil, dayquil, benadryl.  They report that nothing has worked.  They admits to other sick contacts.  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    Review of Systems  Constitutional: Positive for malaise/fatigue. Negative for chills and fever.  HENT: Positive for congestion, ear pain, hearing loss and sore throat.   Respiratory: Positive for cough. Negative for sputum production, shortness of breath and wheezing.   Cardiovascular: Negative for chest pain, palpitations and leg swelling.  Neurological: Positive for headaches.    PE:  Vitals:   05/17/16 1447  BP: 126/84  Pulse: 100  Resp: 18  Temp: 98.2 F (36.8 C)    General:  Alert and non-toxic, WDWN, NAD HEENT: NCAT, PERLA, EOM normal, no occular discharge or erythema.  Nasal mucosal edema with sinus tenderness to palpation.  Oropharynx clear with minimal oropharyngeal edema and erythema.  Mucous membranes moist and pink. Neck:  Cervical adenopathy Chest:  RRR no MRGs.  Lungs clear to auscultation A&P with no wheezes rhonchi or rales.   Abdomen: +BS x 4 quadrants, soft, non-tender, no guarding, rigidity, or rebound. Skin: warm and dry no rash Neuro: A&Ox4, CN II-XII grossly intact  Assessment and Plan:   1. Viral URI -daily antihistamine -benadryl qhs -nasal saline -sudafed if desired - predniSONE (DELTASONE) 20 MG tablet; 3 tabs po daily x 3 days, then 2 tabs x 3 days, then 1.5 tabs x 3 days, then 1 tab x 3 days, then 0.5 tabs x 3 days  Dispense: 27 tablet; Refill: 0 - fluticasone (FLONASE) 50 MCG/ACT nasal spray; Place 2 sprays into  both nostrils daily.  Dispense: 16 g; Refill: 0 - promethazine-dextromethorphan (PROMETHAZINE-DM) 6.25-15 MG/5ML syrup; Take 5-10 ML PO q8hrs prn for cold symptoms.  Dispense: 180 mL; Refill: 1

## 2016-07-05 ENCOUNTER — Encounter: Payer: Self-pay | Admitting: Physician Assistant

## 2016-07-13 ENCOUNTER — Encounter: Payer: Self-pay | Admitting: Physician Assistant

## 2016-07-14 ENCOUNTER — Other Ambulatory Visit: Payer: Self-pay | Admitting: Physician Assistant

## 2016-08-03 ENCOUNTER — Encounter: Payer: Self-pay | Admitting: Physician Assistant

## 2016-08-03 ENCOUNTER — Ambulatory Visit (INDEPENDENT_AMBULATORY_CARE_PROVIDER_SITE_OTHER): Payer: 59 | Admitting: Physician Assistant

## 2016-08-03 VITALS — BP 128/82 | HR 102 | Temp 97.5°F | Resp 16 | Ht 64.0 in | Wt 202.8 lb

## 2016-08-03 DIAGNOSIS — I10 Essential (primary) hypertension: Secondary | ICD-10-CM

## 2016-08-03 DIAGNOSIS — E559 Vitamin D deficiency, unspecified: Secondary | ICD-10-CM

## 2016-08-03 DIAGNOSIS — Z13220 Encounter for screening for lipoid disorders: Secondary | ICD-10-CM

## 2016-08-03 DIAGNOSIS — Z0000 Encounter for general adult medical examination without abnormal findings: Secondary | ICD-10-CM

## 2016-08-03 DIAGNOSIS — G43809 Other migraine, not intractable, without status migrainosus: Secondary | ICD-10-CM

## 2016-08-03 DIAGNOSIS — M26609 Unspecified temporomandibular joint disorder, unspecified side: Secondary | ICD-10-CM

## 2016-08-03 DIAGNOSIS — F32A Depression, unspecified: Secondary | ICD-10-CM

## 2016-08-03 DIAGNOSIS — F329 Major depressive disorder, single episode, unspecified: Secondary | ICD-10-CM

## 2016-08-03 DIAGNOSIS — Z131 Encounter for screening for diabetes mellitus: Secondary | ICD-10-CM

## 2016-08-03 DIAGNOSIS — Z79899 Other long term (current) drug therapy: Secondary | ICD-10-CM

## 2016-08-03 MED ORDER — VENLAFAXINE HCL ER 75 MG PO CP24: ORAL_CAPSULE | ORAL | 3 refills | Status: DC

## 2016-08-03 NOTE — Progress Notes (Signed)
Complete physical  Assessment and Plan:  Hypertension -Continue medication, monitor blood pressure at home. Continue DASH diet.  Reminder to go to the ER if any CP, SOB, nausea, dizziness, severe HA, changes vision/speech, left arm numbness and tingling and jaw pain.  Screen Cholesterol -Continue diet and exercise. Check cholesterol.   Obesity  long discussion about weight loss, diet, and exercise  Vitamin D Def - check level and continue medications.   Migraines, Given information about prevention, continue verapamil and effexor, if not better can refer to HA clinic  Depression effexor increase to 75mg   Screening cholesterol level -     Lipid panel  Screening for diabetes mellitus -     Hemoglobin A1c  Medication management -     Magnesium   Continue diet and meds as discussed. Further disposition pending results of labs. Over 30 minutes of exam, counseling, chart review, and critical decision making was performed [facility] at [facility] Future Appointments Date Time Provider [place]  08/07/2017 3:00 PM Vicie Mutters, PA-C [facility] None    HPI 36 y.o. female  presents for CPE and 3 month follow up on hypertension, cholesterol, prediabetes, and vitamin D deficiency.   Her blood pressure has been controlled at home, today their BP is BP: 128/82  She is on verapamil and effexor, has HA 3-4 x a week, will take ibuprofen those times, HA not lasting as long.   She does workout. She denies chest pain, shortness of breath, dizziness.  She is not on cholesterol medication and denies myalgias. Her cholesterol is at goal. The cholesterol last visit was:   Lab Results  Component Value Date   CHOL 180 07/06/2014    She has been working on diet and exercise for prediabetes, and denies paresthesia of the feet, polydipsia, polyuria and visual disturbances. Last A1C in the office was:  Lab Results  Component Value Date   HGBA1C 5.6 07/06/2014   Patient is on Vitamin D  supplement, she is on vitamin D 50,000 once a week for 3 months but then stopped.  Lab Results  Component Value Date   VD25OH 27.3 (L) 12/14/2014     BMI is Body mass index is 34.81 kg/m., she is working on diet and exercise. She openly admits to increased eating with stress and will drink soda, phentermine does not help anymore, can not get contrave or belviq due to insurance constraints.   She still hates her job, has increased, her husband has two jobs and is gone a lot. Son is now toddle and talking.  Wt Readings from Last 3 Encounters:  08/03/16 202 lb 12.8 oz (92 kg)  05/17/16 195 lb (88.5 kg)  02/09/16 206 lb 3.2 oz (93.5 kg)     Current Medications:  Current Outpatient Prescriptions on File Prior to Visit  Medication Sig Dispense Refill  . drospirenone-ethinyl estradiol (OCELLA) 3-0.03 MG tablet Take 1 tablet by mouth daily. Patient skips placebos 5 Package 4  . fluticasone (FLONASE) 50 MCG/ACT nasal spray Place 2 sprays into both nostrils daily. 16 g 0  . verapamil (CALAN-SR) 240 MG CR tablet TAKE 1 TABLET BY MOUTH AT BEDTIME 30 tablet 0   No current facility-administered medications on file prior to visit.    Medical History:  Past Medical History:  Diagnosis Date  . 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 Migraines    Preventative medicine There is no immunization history for the selected administration types on file for this  patient.  TDAP 2015 Influenza 2017 Pneumonia N/A Prevnar N/A age 56 Zoster N/A age 49-60  Patient's last menstrual period was 07/14/2016. MGM N/A PAP 2016, due next year Colonoscopy N/A  Allergies No Known Allergies  SURGICAL HISTORY She  has a past surgical history that includes Nasal septum surgery and deviated septum (2004). FAMILY HISTORY Her family history includes Asthma in her sister and sister; Cancer in her paternal grandmother; Diabetes in her maternal grandmother; Hypertension in her brother, maternal  grandmother, and mother; Mental illness in her maternal grandmother. SOCIAL HISTORY She  reports that she has never smoked. She has never used smokeless tobacco. She reports that she does not drink alcohol or use drugs.  Review of Systems:  Review of Systems  Constitutional: Negative.  Negative for chills, diaphoresis, fever and weight loss.  HENT: Negative for congestion, ear discharge, ear pain, hearing loss, nosebleeds, sore throat and tinnitus.   Eyes: Negative.   Respiratory: Negative.  Negative for stridor.   Cardiovascular: Negative.   Gastrointestinal: Negative.  Negative for abdominal pain, blood in stool, constipation, diarrhea, heartburn, melena, nausea and vomiting.  Genitourinary: Negative.  Negative for dysuria, flank pain, frequency, hematuria and urgency.  Musculoskeletal: Negative.  Negative for back pain, falls, joint pain, myalgias and neck pain.  Skin: Negative.   Neurological: Positive for headaches. Negative for dizziness, tingling, tremors, sensory change, speech change, focal weakness, seizures, loss of consciousness and weakness.  Endo/Heme/Allergies: Negative.   Psychiatric/Behavioral: Positive for depression. Negative for hallucinations, memory loss, substance abuse and suicidal ideas. The patient has insomnia.    Physical Exam: BP 128/82   Pulse (!) 102   Temp 97.5 F (36.4 C)   Resp 16   Ht 5\' 4"  (1.626 m)   Wt 202 lb 12.8 oz (92 kg)   LMP 07/14/2016   SpO2 98%   BMI 34.81 kg/m  Wt Readings from Last 3 Encounters:  08/03/16 202 lb 12.8 oz (92 kg)  05/17/16 195 lb (88.5 kg)  02/09/16 206 lb 3.2 oz (93.5 kg)   General Appearance: Well nourished, in no apparent distress. Eyes: PERRLA, EOMs, conjunctiva no swelling or erythema Sinuses: No Frontal/maxillary tenderness ENT/Mouth: Ext aud canals clear, TMs without erythema, bulging. No erythema, swelling, or exudate on post pharynx.  Tonsils not swollen or erythematous. Hearing normal.  Neck: Supple,  thyroid normal.  Respiratory: Respiratory effort normal, BS equal bilaterally without rales, rhonchi, wheezing or stridor.  Cardio: RRR with no MRGs. Brisk peripheral pulses without edema.  Abdomen: Soft, + BS,  Non tender, no guarding, rebound, hernias, masses. Lymphatics: Non tender without lymphadenopathy.  Musculoskeletal: Full ROM, 5/5 strength, Normal gait Skin: Warm, dry without rashes, lesions, ecchymosis.  Neuro: Cranial nerves intact. Normal muscle tone, no cerebellar symptoms. Psych: Awake and oriented X 3, normal affect, Insight and Judgment appropriate.    Vicie Mutters, PA-C 3:47 PM [facility]

## 2016-08-03 NOTE — Patient Instructions (Addendum)
To prevent migraines you can try these natural/OTC medications as prevention: 1) Melatonin 5mg -15mg  30 mins before bed 2) Riboflavin/B2 400mg  daily 3) CoQ10 100mg  3 x a day  We may also treat TMJ if we think you have it If you are having frequent migraines we may put you on a once a day medication with fast acting medication to take. Here is more information below  Please remember, common headache triggers are: sleep deprivation, dehydration, overheating, stress, hypoglycemia or skipping meals and blood sugar fluctuations, excessive pain medications or excessive alcohol use or caffeine withdrawal. Some people have food triggers such as aged cheese, orange juice or chocolate, especially dark chocolate, or MSG (monosodium glutamate). Try to avoid these headache triggers as much possible. It may be helpful to keep a headache diary to figure out what makes your headaches worse or brings them on and what alleviates them. Some people report headache onset after exercise but studies have shown that regular exercise may actually prevent headaches from coming. If you have exercise-induced headaches, please make sure that you drink plenty of fluid before and after exercising and that you do not over do it and do not overheat.    Migraine Headache A migraine headache is an intense, throbbing pain on one or both sides of your head. Recurrent migraines keep coming back. A migraine can last for 30 minutes to several hours. CAUSES  The exact cause of a migraine headache is not always known. However, a migraine may be caused when nerves in the brain become irritated and release chemicals that cause inflammation. This causes pain. Certain things may also trigger migraines, such as:   Alcohol.  Smoking.  Stress.  Menstruation.  Aged cheeses.  Foods or drinks that contain nitrates, glutamate, aspartame, or tyramine.  Lack of sleep.  Chocolate.  Caffeine.  Hunger.  Physical  exertion.  Fatigue.  Medicines used to treat chest pain (nitroglycerine), birth control pills, estrogen, and some blood pressure medicines. SYMPTOMS   Pain on one or both sides of your head.  Pulsating or throbbing pain.  Severe pain that prevents daily activities.  Pain that is aggravated by any physical activity.  Nausea, vomiting, or both.  Dizziness.  Pain with exposure to bright lights, loud noises, or activity.  General sensitivity to bright lights, loud noises, or smells. Before you get a migraine, you may get warning signs that a migraine is coming (aura). An aura may include:  Seeing flashing lights.  Seeing bright spots, halos, or zigzag lines.  Having tunnel vision or blurred vision.  Having feelings of numbness or tingling.  Having trouble talking.  Having muscle weakness. DIAGNOSIS  A recurrent migraine headache is often diagnosed based on:  Symptoms.  Physical examination.  A CT scan or MRI of your head. These imaging tests cannot diagnose migraines but can help rule out other causes of headaches.  TREATMENT  Medicines may be given for pain and nausea. Medicines can also be given to help prevent recurrent migraines. HOME CARE INSTRUCTIONS  Only take over-the-counter or prescription medicines for pain or discomfort as directed by your health care provider. The use of long-term narcotics is not recommended.  Lie down in a dark, quiet room when you have a migraine.  Keep a journal to find out what may trigger your migraine headaches. For example, write down:  What you eat and drink.  How much sleep you get.  Any change to your diet or medicines.  Limit alcohol consumption.  Quit smoking if  you smoke.  Get 7-9 hours of sleep, or as recommended by your health care provider.  Limit stress.  Keep lights dim if bright lights bother you and make your migraines worse. SEEK MEDICAL CARE IF:   You do not get relief from the medicines given to  you.  You have a recurrence of pain.  You have a fever. SEEK IMMEDIATE MEDICAL CARE IF:  Your migraine becomes severe.  You have a stiff neck.  You have loss of vision.  You have muscular weakness or loss of muscle control.  You start losing your balance or have trouble walking.  You feel faint or pass out. You have severe symptoms that are different from your first symptoms. MAKE SURE YOU:   Understand these instructions.  Will watch your condition.  Will get help right away if you are not doing well or get worse.   This information is not intended to replace advice given to you by your health care provider. Make sure you discuss any questions you have with your health care provider.   Document Released: 01/03/2001 Document Revised: 05/01/2014 Document Reviewed: 12/16/2012 Elsevier Interactive Patient Education 2016 [employer].  Common Migraine Triggers   Foods Aged cheese, alcohol, nuts, chocolate, yogurt, onions, figs, liver, caffeinated foods and beverages, monosodium glutamate (MSG), smoked or pickled fish/meat, nitrate/nitrate preserved foods (hotdogs, pepperoni, salami) tyramine  Medications Antibiotics (tetracycline, griseofulvin), antihypertensives (nifedipine, captopril), hormones (oral contraceptives, estrogens), histamine-2 blockers (cimetidine, raniidine, vasodilators (nitroglycerine, isosorbide dinitrate)  Sensory Stimuli Flickering/bright/fluorescent lights, bright sunlight, odors (perfume, chemicals, cigarette smoke)  Lifestyle Changes Time zones, sleep patterns, eating habits, caffeine withdrawal stress  Other Menstrual cycle, weather/season/air pressure changes, high altitude  Adapted from [place] and [place], [place]. [facility] [place]; [place] and [place]. Conquering Headache, 1998  Hormonal variations also are believed to play a part.  Fluctuations of the female hormone estrogen (such as just before menstruation) affect a chemical called serotonin-when  serotonin levels in the brain fall, the dilation (expansion) of blood vessels in the brain that is characteristic of migraine often follows.  Many factors or "triggers" can start a migraine.  In people who get migraines, most experts think certain activities or foods may trigger temporary changes in the blood vessels around the brain.  Swelling of these blood vessels may cause pain in the nearby nerves.  Allergy Headaches:  Hotdogs Milk  Onions  Thyme Bacon  Chocolate Garlic  Nutmeg Ham  Dark Cola Pork  Cinnamon Salami  Nuts  Egg  Ginger Sausage Red wine Cloves  Cheddar Cheese Caffeine   What is the TMJ? The temporomandibular (tem-PUH-ro-man-DIB-yoo-ler) joint, or the TMJ, connects the upper and lower jawbones. This joint allows the jaw to open wide and move back and forth when you chew, talk, or yawn.There are also several muscles that help this joint move. There can be muscle tightness and pain in the muscle that can cause several symptoms.  What causes TMJ pain? There are many causes of TMJ pain. Repeated chewing (for example, chewing gum) and clenching your teeth can cause pain in the joint. Some TMJ pain has no obvious cause. What can I do to ease the pain? There are many things you can do to help your pain get better. When you have pain:  Eat soft foods and stay away from chewy foods (for example, taffy) Try to use both sides of your mouth to chew Don't chew gum Massage Don't open your mouth wide (for example, during yawning or singing) Don't bite  your cheeks or fingernails Lower your amount of stress and worry Applying a warm, damp washcloth to the joint may help. Over-the-counter pain medicines such as ibuprofen (one brand: Advil) or acetaminophen (one brand: Tylenol) might also help. Do not use these medicines if you are allergic to them or if your doctor told you not to use them. How can I stop the pain from coming back? When your pain is better, you can do these exercises to  make your muscles stronger and to keep the pain from coming back:  Resisted mouth opening: Place your thumb or two fingers under your chin and open your mouth slowly, pushing up lightly on your chin with your thumb. Hold for three to six seconds. Close your mouth slowly. Resisted mouth closing: Place your thumbs under your chin and your two index fingers on the ridge between your mouth and the bottom of your chin. Push down lightly on your chin as you close your mouth. Tongue up: Slowly open and close your mouth while keeping the tongue touching the roof of the mouth. Side-to-side jaw movement: Place an object about one fourth of an inch thick (for example, two tongue depressors) between your front teeth. Slowly move your jaw from side to side. Increase the thickness of the object as the exercise becomes easier Forward jaw movement: Place an object about one fourth of an inch thick between your front teeth and move the bottom jaw forward so that the bottom teeth are in front of the top teeth. Increase the thickness of the object as the exercise becomes easier. These exercises should not be painful. If it hurts to do these exercises, stop doing them and talk to your family doctor.

## 2016-08-04 LAB — BASIC METABOLIC PANEL WITH GFR
BUN: 6 mg/dL — ABNORMAL LOW (ref 7–25)
CO2: 20 mmol/L (ref 20–31)
Calcium: 9.3 mg/dL (ref 8.6–10.2)
Chloride: 104 mmol/L (ref 98–110)
Creat: 0.72 mg/dL (ref 0.50–1.10)
GLUCOSE: 85 mg/dL (ref 65–99)
POTASSIUM: 3.9 mmol/L (ref 3.5–5.3)
Sodium: 137 mmol/L (ref 135–146)

## 2016-08-04 LAB — CBC WITH DIFFERENTIAL/PLATELET
BASOS PCT: 0 %
Basophils Absolute: 0 cells/uL (ref 0–200)
EOS ABS: 88 {cells}/uL (ref 15–500)
Eosinophils Relative: 1 %
HEMATOCRIT: 38.4 % (ref 35.0–45.0)
Hemoglobin: 12.6 g/dL (ref 11.7–15.5)
LYMPHS ABS: 3256 {cells}/uL (ref 850–3900)
LYMPHS PCT: 37 %
MCH: 29.2 pg (ref 27.0–33.0)
MCHC: 32.8 g/dL (ref 32.0–36.0)
MCV: 89.1 fL (ref 80.0–100.0)
MONO ABS: 616 {cells}/uL (ref 200–950)
MPV: 10.5 fL (ref 7.5–12.5)
Monocytes Relative: 7 %
NEUTROS ABS: 4840 {cells}/uL (ref 1500–7800)
Neutrophils Relative %: 55 %
PLATELETS: 309 10*3/uL (ref 140–400)
RBC: 4.31 MIL/uL (ref 3.80–5.10)
RDW: 13.7 % (ref 11.0–15.0)
WBC: 8.8 10*3/uL (ref 3.8–10.8)

## 2016-08-04 LAB — HEPATIC FUNCTION PANEL
ALK PHOS: 35 U/L (ref 33–115)
ALT: 15 U/L (ref 6–29)
AST: 14 U/L (ref 10–30)
Albumin: 3.8 g/dL (ref 3.6–5.1)
BILIRUBIN INDIRECT: 0.2 mg/dL (ref 0.2–1.2)
BILIRUBIN TOTAL: 0.3 mg/dL (ref 0.2–1.2)
Bilirubin, Direct: 0.1 mg/dL (ref <=0.2)
Total Protein: 6.9 g/dL (ref 6.1–8.1)

## 2016-08-04 LAB — TSH: TSH: 0.8 mIU/L

## 2016-08-04 LAB — LIPID PANEL
CHOL/HDL RATIO: 2.5 ratio (ref <5.0)
Cholesterol: 172 mg/dL (ref <200)
HDL: 70 mg/dL (ref >50)
LDL CALC: 82 mg/dL (ref <100)
Triglycerides: 100 mg/dL (ref <150)
VLDL: 20 mg/dL (ref <30)

## 2016-08-04 LAB — VITAMIN D 25 HYDROXY (VIT D DEFICIENCY, FRACTURES): Vit D, 25-Hydroxy: 29 ng/mL — ABNORMAL LOW (ref 30–100)

## 2016-08-04 LAB — MAGNESIUM: MAGNESIUM: 1.7 mg/dL (ref 1.5–2.5)

## 2016-08-04 LAB — HEMOGLOBIN A1C
Hgb A1c MFr Bld: 5.1 % (ref <5.7)
MEAN PLASMA GLUCOSE: 100 mg/dL

## 2016-09-12 ENCOUNTER — Other Ambulatory Visit: Payer: Self-pay | Admitting: Physician Assistant

## 2016-10-27 ENCOUNTER — Encounter: Payer: Self-pay | Admitting: Physician Assistant

## 2016-10-30 ENCOUNTER — Other Ambulatory Visit: Payer: Self-pay | Admitting: Physician Assistant

## 2016-10-30 MED ORDER — PHENTERMINE HCL 37.5 MG PO TABS: 37.5000 mg | ORAL_TABLET | Freq: Every day | ORAL | 2 refills | Status: DC

## 2016-10-30 NOTE — Progress Notes (Unsigned)
Please call in phentermine 

## 2016-10-30 NOTE — Progress Notes (Signed)
Phentermine was called into pharmacy on 9th July 2018 @ 8:16am by DD

## 2016-10-30 NOTE — Progress Notes (Signed)
Phentermine was called into [facility] on 9th July 2018 by dd

## 2016-12-06 ENCOUNTER — Other Ambulatory Visit: Payer: Self-pay | Admitting: Physician Assistant

## 2016-12-06 DIAGNOSIS — G43809 Other migraine, not intractable, without status migrainosus: Secondary | ICD-10-CM

## 2017-02-02 ENCOUNTER — Ambulatory Visit: Payer: Self-pay | Admitting: Physician Assistant

## 2017-02-23 ENCOUNTER — Ambulatory Visit (INDEPENDENT_AMBULATORY_CARE_PROVIDER_SITE_OTHER): Payer: 59 | Admitting: Physician Assistant

## 2017-02-23 ENCOUNTER — Encounter: Payer: Self-pay | Admitting: Physician Assistant

## 2017-02-23 VITALS — BP 130/76 | HR 97 | Temp 97.7°F | Resp 16 | Ht 64.0 in | Wt 208.2 lb

## 2017-02-23 DIAGNOSIS — Z6835 Body mass index (BMI) 35.0-35.9, adult: Secondary | ICD-10-CM

## 2017-02-23 DIAGNOSIS — Z79899 Other long term (current) drug therapy: Secondary | ICD-10-CM

## 2017-02-23 DIAGNOSIS — G43809 Other migraine, not intractable, without status migrainosus: Secondary | ICD-10-CM | POA: Diagnosis not present

## 2017-02-23 DIAGNOSIS — I10 Essential (primary) hypertension: Secondary | ICD-10-CM

## 2017-02-23 DIAGNOSIS — M26609 Unspecified temporomandibular joint disorder, unspecified side: Secondary | ICD-10-CM

## 2017-02-23 LAB — CBC WITH DIFFERENTIAL/PLATELET
BASOS ABS: 49 {cells}/uL (ref 0–200)
BASOS PCT: 0.5 %
EOS PCT: 1.4 %
Eosinophils Absolute: 136 cells/uL (ref 15–500)
HEMATOCRIT: 39.3 % (ref 35.0–45.0)
HEMOGLOBIN: 13.4 g/dL (ref 11.7–15.5)
LYMPHS ABS: 3463 {cells}/uL (ref 850–3900)
MCH: 30.3 pg (ref 27.0–33.0)
MCHC: 34.1 g/dL (ref 32.0–36.0)
MCV: 88.9 fL (ref 80.0–100.0)
MONOS PCT: 8.9 %
MPV: 11.2 fL (ref 7.5–12.5)
NEUTROS ABS: 5190 {cells}/uL (ref 1500–7800)
Neutrophils Relative %: 53.5 %
Platelets: 298 10*3/uL (ref 140–400)
RBC: 4.42 10*6/uL (ref 3.80–5.10)
RDW: 12.2 % (ref 11.0–15.0)
Total Lymphocyte: 35.7 %
WBC mixed population: 863 cells/uL (ref 200–950)
WBC: 9.7 10*3/uL (ref 3.8–10.8)

## 2017-02-23 LAB — BASIC METABOLIC PANEL WITH GFR
BUN: 7 mg/dL (ref 7–25)
CALCIUM: 9.7 mg/dL (ref 8.6–10.2)
CO2: 27 mmol/L (ref 20–32)
CREATININE: 0.64 mg/dL (ref 0.50–1.10)
Chloride: 103 mmol/L (ref 98–110)
GFR, EST NON AFRICAN AMERICAN: 116 mL/min/{1.73_m2} (ref >=60)
GFR, Est African American: 135 mL/min/{1.73_m2} (ref >=60)
GLUCOSE: 87 mg/dL (ref 65–99)
Potassium: 4.4 mmol/L (ref 3.5–5.3)
SODIUM: 138 mmol/L (ref 135–146)

## 2017-02-23 LAB — HEPATIC FUNCTION PANEL
AG RATIO: 1.3 (calc) (ref 1.0–2.5)
ALBUMIN MSPROF: 4.2 g/dL (ref 3.6–5.1)
ALKALINE PHOSPHATASE (APISO): 47 U/L (ref 33–115)
ALT: 17 U/L (ref 6–29)
AST: 17 U/L (ref 10–30)
BILIRUBIN TOTAL: 0.3 mg/dL (ref 0.2–1.2)
Bilirubin, Direct: 0.1 mg/dL (ref 0.0–0.2)
GLOBULIN: 3.2 g/dL (ref 1.9–3.7)
Indirect Bilirubin: 0.2 mg/dL (calc) (ref 0.2–1.2)
TOTAL PROTEIN: 7.4 g/dL (ref 6.1–8.1)

## 2017-02-23 LAB — TSH: TSH: 1.11 mIU/L

## 2017-02-23 MED ORDER — VENLAFAXINE HCL ER 37.5 MG PO CP24: 37.5000 mg | ORAL_CAPSULE | Freq: Every day | ORAL | 0 refills | Status: DC

## 2017-02-23 NOTE — Progress Notes (Signed)
Complete physical  Assessment and Plan:  Hypertension -Continue medication, monitor blood pressure at home. Continue DASH diet.  Reminder to go to the ER if any CP, SOB, nausea, dizziness, severe HA, changes vision/speech, left arm numbness and tingling and jaw pain.  Obesity  long discussion about weight loss, diet, and exercise  Migraines, Given information about prevention, continue verapamil  Depression Will cut back on effexor and stop completely due to possible trying to get pregnant.   Continue diet and meds as discussed. Further disposition pending results of labs. Over 30 minutes of exam, counseling, chart review, and critical decision making was performed [facility] at [facility] Future Appointments Date Time Provider [place]  08/07/2017 3:00 PM Vicie Mutters, PA-C [facility] None    HPI 35 y.o. female  presents for 6 month follow up on hypertension, cholesterol, prediabetes, and vitamin D deficiency.   Her blood pressure has been controlled at home, today their BP is BP: 130/76  She is on verapamil and effexor, but states she has been having sweats, feeling "twitchy all over" and hot lately since increase in the effexor.   She does workout. She denies chest pain, shortness of breath, dizziness.  She is not on cholesterol medication and denies myalgias. Her cholesterol is at goal. The cholesterol last visit was:   Lab Results  Component Value Date   CHOL 172 08/03/2016   HDL 70 08/03/2016   LDLCALC 82 08/03/2016   TRIG 100 08/03/2016   CHOLHDL 2.5 08/03/2016    She has been working on diet and exercise for prediabetes, and denies paresthesia of the feet, polydipsia, polyuria and visual disturbances. Last A1C in the office was:  Lab Results  Component Value Date   HGBA1C 5.1 08/03/2016   Patient is on Vitamin D supplement, she is on vitamin D 50,000 once a week for 3 months but then stopped.  Lab Results  Component Value Date   VD25OH 29 (L) 08/03/2016     BMI is Body mass index is 35.74 kg/m., she is working on diet and exercise.   She still hates her job,  her husband works 1 job now and it is doing better.  Son is now toddler 7 in Feb and her and her husband are discussing having another kid.  Wt Readings from Last 3 Encounters:  02/23/17 208 lb 3.2 oz (94.4 kg)  08/03/16 202 lb 12.8 oz (92 kg)  05/17/16 195 lb (88.5 kg)   Current Medications:  Current Outpatient Prescriptions on File Prior to Visit  Medication Sig Dispense Refill  . Cholecalciferol (VITAMIN D PO) Take 5,000 mg by mouth.    . venlafaxine XR (EFFEXOR-XR) 75 MG 24 hr capsule TAKE ONE CAPSULE BY MOUTH EVERY MORNING FOR ANXIETY OR HEADACHE 90 capsule 0  . verapamil (CALAN-SR) 240 MG CR tablet TAKE 1 TABLET BY MOUTH AT BEDTIME 30 tablet 3   No current facility-administered medications on file prior to visit.    Medical History:  Past Medical History:  Diagnosis Date  . 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 Migraines    Allergies No Known Allergies  Surgical History: reviewed and unchanged Family History: reviewed and unchanged Social History: reviewed and unchanged  Review of Systems:  Review of Systems  Constitutional: Negative.  Negative for chills, diaphoresis, fever and weight loss.  HENT: Negative for congestion, ear discharge, ear pain, hearing loss, nosebleeds, sore throat and tinnitus.   Eyes: Negative.   Respiratory: Negative.  Negative  for stridor.   Cardiovascular: Negative.   Gastrointestinal: Negative.  Negative for abdominal pain, blood in stool, constipation, diarrhea, heartburn, melena, nausea and vomiting.  Genitourinary: Negative.  Negative for dysuria, flank pain, frequency, hematuria and urgency.  Musculoskeletal: Negative.  Negative for back pain, falls, joint pain, myalgias and neck pain.  Skin: Negative.   Neurological: Negative for dizziness, tingling, tremors, sensory change, speech change, focal weakness,  seizures, loss of consciousness, weakness and headaches.  Endo/Heme/Allergies: Negative.   Psychiatric/Behavioral: Negative for depression, hallucinations, memory loss, substance abuse and suicidal ideas. The patient does not have insomnia.    Physical Exam: BP 130/76   Pulse 97   Temp (!) 107 F (41.7 C)   Resp 16   Ht 5\' 4"  (1.626 m)   Wt 208 lb 3.2 oz (94.4 kg)   LMP 02/23/2017   SpO2 98%   BMI 35.74 kg/m  Wt Readings from Last 3 Encounters:  02/23/17 208 lb 3.2 oz (94.4 kg)  08/03/16 202 lb 12.8 oz (92 kg)  05/17/16 195 lb (88.5 kg)   General Appearance: Well nourished, in no apparent distress. Eyes: PERRLA, EOMs, conjunctiva no swelling or erythema Sinuses: No Frontal/maxillary tenderness ENT/Mouth: Ext aud canals clear, TMs without erythema, bulging. No erythema, swelling, or exudate on post pharynx.  Tonsils not swollen or erythematous. Hearing normal.  Neck: Supple, thyroid normal.  Respiratory: Respiratory effort normal, BS equal bilaterally without rales, rhonchi, wheezing or stridor.  Cardio: RRR with no MRGs. Brisk peripheral pulses without edema.  Abdomen: Soft, + BS,  Non tender, no guarding, rebound, hernias, masses. Lymphatics: Non tender without lymphadenopathy.  Musculoskeletal: Full ROM, 5/5 strength, Normal gait Skin: Warm, dry without rashes, lesions, ecchymosis.  Neuro: Cranial nerves intact. Normal muscle tone, no cerebellar symptoms. Psych: Awake and oriented X 3, normal affect, Insight and Judgment appropriate.    Vicie Mutters, PA-C 11:14 AM [facility]

## 2017-02-23 NOTE — Patient Instructions (Addendum)
Cut back to 37.5mg  of the effexor and do that x 2 weeks, can stop or do every other day x 1 week.   No sweet tea or soda Drink 80-100 oz a day of water, measure it out Eat 3 meals a day, have to do breakfast, eat protein- hard boiled eggs, protein bar like nature valley protein bar, greek yogurt like oikos triple zero, chobani 100, or light n fit greek  Try to meal plan dinner and eat something Protein bar, protein shake,and steamed veggies.    To prevent migraines you can try these natural/OTC medications as prevention: 1) Melatonin 5mg -15mg  30 mins before bed 2) Riboflavin/B2 400mg  daily 3) CoQ10 100mg  3 x a day  We may also treat TMJ if we think you have it If you are having frequent migraines we may put you on a once a day medication with fast acting medication to take. Here is more information below  Please remember, common headache triggers are: sleep deprivation, dehydration, overheating, stress, hypoglycemia or skipping meals and blood sugar fluctuations, excessive pain medications or excessive alcohol use or caffeine withdrawal. Some people have food triggers such as aged cheese, orange juice or chocolate, especially dark chocolate, or MSG (monosodium glutamate). Try to avoid these headache triggers as much possible. It may be helpful to keep a headache diary to figure out what makes your headaches worse or brings them on and what alleviates them. Some people report headache onset after exercise but studies have shown that regular exercise may actually prevent headaches from coming. If you have exercise-induced headaches, please make sure that you drink plenty of fluid before and after exercising and that you do not over do it and do not overheat.    Migraine Headache A migraine headache is an intense, throbbing pain on one or both sides of your head. Recurrent migraines keep coming back. A migraine can last for 30 minutes to several hours. CAUSES  The exact cause of a migraine  headache is not always known. However, a migraine may be caused when nerves in the brain become irritated and release chemicals that cause inflammation. This causes pain. Certain things may also trigger migraines, such as:   Alcohol.  Smoking.  Stress.  Menstruation.  Aged cheeses.  Foods or drinks that contain nitrates, glutamate, aspartame, or tyramine.  Lack of sleep.  Chocolate.  Caffeine.  Hunger.  Physical exertion.  Fatigue.  Medicines used to treat chest pain (nitroglycerine), birth control pills, estrogen, and some blood pressure medicines. SYMPTOMS   Pain on one or both sides of your head.  Pulsating or throbbing pain.  Severe pain that prevents daily activities.  Pain that is aggravated by any physical activity.  Nausea, vomiting, or both.  Dizziness.  Pain with exposure to bright lights, loud noises, or activity.  General sensitivity to bright lights, loud noises, or smells. Before you get a migraine, you may get warning signs that a migraine is coming (aura). An aura may include:  Seeing flashing lights.  Seeing bright spots, halos, or zigzag lines.  Having tunnel vision or blurred vision.  Having feelings of numbness or tingling.  Having trouble talking.  Having muscle weakness. DIAGNOSIS  A recurrent migraine headache is often diagnosed based on:  Symptoms.  Physical examination.  A CT scan or MRI of your head. These imaging tests cannot diagnose migraines but can help rule out other causes of headaches.  TREATMENT  Medicines may be given for pain and nausea. Medicines can also be given  to help prevent recurrent migraines. HOME CARE INSTRUCTIONS  Only take over-the-counter or prescription medicines for pain or discomfort as directed by your health care provider. The use of long-term narcotics is not recommended.  Lie down in a dark, quiet room when you have a migraine.  Keep a journal to find out what may trigger your migraine  headaches. For example, write down:  What you eat and drink.  How much sleep you get.  Any change to your diet or medicines.  Limit alcohol consumption.  Quit smoking if you smoke.  Get 7-9 hours of sleep, or as recommended by your health care provider.  Limit stress.  Keep lights dim if bright lights bother you and make your migraines worse. SEEK MEDICAL CARE IF:   You do not get relief from the medicines given to you.  You have a recurrence of pain.  You have a fever. SEEK IMMEDIATE MEDICAL CARE IF:  Your migraine becomes severe.  You have a stiff neck.  You have loss of vision.  You have muscular weakness or loss of muscle control.  You start losing your balance or have trouble walking.  You feel faint or pass out. You have severe symptoms that are different from your first symptoms. MAKE SURE YOU:   Understand these instructions.  Will watch your condition.  Will get help right away if you are not doing well or get worse.   This information is not intended to replace advice given to you by your health care provider. Make sure you discuss any questions you have with your health care provider.   Document Released: 01/03/2001 Document Revised: 05/01/2014 Document Reviewed: 12/16/2012 Elsevier Interactive Patient Education 2016 [employer].  Common Migraine Triggers   Foods Aged cheese, alcohol, nuts, chocolate, yogurt, onions, figs, liver, caffeinated foods and beverages, monosodium glutamate (MSG), smoked or pickled fish/meat, nitrate/nitrate preserved foods (hotdogs, pepperoni, salami) tyramine  Medications Antibiotics (tetracycline, griseofulvin), antihypertensives (nifedipine, captopril), hormones (oral contraceptives, estrogens), histamine-2 blockers (cimetidine, raniidine, vasodilators (nitroglycerine, isosorbide dinitrate)  Sensory Stimuli Flickering/bright/fluorescent lights, bright sunlight, odors (perfume, chemicals, cigarette smoke)  Lifestyle  Changes Time zones, sleep patterns, eating habits, caffeine withdrawal stress  Other Menstrual cycle, weather/season/air pressure changes, high altitude  Adapted from [place] and [place], [place]. [facility] [place]; [place] and [place]. [facility] Headache, 1998  Hormonal variations also are believed to play a part.  Fluctuations of the female hormone estrogen (such as just before menstruation) affect a chemical called serotonin-when serotonin levels in the brain fall, the dilation (expansion) of blood vessels in the brain that is characteristic of migraine often follows.  Many factors or "triggers" can start a migraine.  In people who get migraines, most experts think certain activities or foods may trigger temporary changes in the blood vessels around the brain.  Swelling of these blood vessels may cause pain in the nearby nerves.  Allergy Headaches:  Hotdogs Milk  Onions  Thyme Bacon  Chocolate Garlic  Nutmeg Ham  Dark Cola Pork  Cinnamon Salami  Nuts  Egg  Ginger Sausage Red wine Cloves  Cheddar Cheese Caffeine   Take omeprazole over the counter for 2 weeks, then go to zantac 150-300 mg at night for 2 weeks, then you can stop.  Avoid alcohol, spicy foods, NSAIDS (aleve, ibuprofen) at this time. See foods below.   Food Choices for Gastroesophageal Reflux Disease When you have gastroesophageal reflux disease (GERD), the foods you eat and your eating habits are very important. Choosing the right foods can  help ease the discomfort of GERD. WHAT GENERAL GUIDELINES DO I NEED TO FOLLOW?  Choose fruits, vegetables, whole grains, low-fat dairy products, and low-fat meat, fish, and poultry.  Limit fats such as oils, salad dressings, butter, nuts, and avocado.  Keep a food diary to identify foods that cause symptoms.  Avoid foods that cause reflux. These may be different for different people.  Eat frequent small meals instead of three large meals each day.  Eat your meals slowly, in a  relaxed setting.  Limit fried foods.  Cook foods using methods other than frying.  Avoid drinking alcohol.  Avoid drinking large amounts of liquids with your meals.  Avoid bending over or lying down until 2-3 hours after eating. WHAT FOODS ARE NOT RECOMMENDED? The following are some foods and drinks that may worsen your symptoms: Vegetables Tomatoes. Tomato juice. Tomato and spaghetti sauce. Chili peppers. Onion and garlic. Horseradish. Fruits Oranges, grapefruit, and lemon (fruit and juice). Meats High-fat meats, fish, and poultry. This includes hot dogs, ribs, ham, sausage, salami, and bacon. Dairy Whole milk and chocolate milk. Sour cream. Cream. Butter. Ice cream. Cream cheese.  Beverages Coffee and tea, with or without caffeine. Carbonated beverages or energy drinks. Condiments Hot sauce. Barbecue sauce.  Sweets/Desserts Chocolate and cocoa. Donuts. Peppermint and spearmint. Fats and Oils High-fat foods, including [place] fries and potato chips. Other Vinegar. Strong spices, such as black pepper, white pepper, red pepper, cayenne, curry powder, cloves, ginger, and chili powder.

## 2017-02-24 ENCOUNTER — Other Ambulatory Visit: Payer: Self-pay | Admitting: Physician Assistant

## 2017-04-30 ENCOUNTER — Encounter: Payer: Self-pay | Admitting: Physician Assistant

## 2017-04-30 ENCOUNTER — Other Ambulatory Visit: Payer: Self-pay | Admitting: Physician Assistant

## 2017-04-30 MED ORDER — PHENTERMINE HCL 37.5 MG PO TABS: 37.5000 mg | ORAL_TABLET | Freq: Every day | ORAL | 0 refills | Status: DC

## 2017-06-04 ENCOUNTER — Ambulatory Visit: Payer: Self-pay | Admitting: Physician Assistant

## 2017-06-28 ENCOUNTER — Ambulatory Visit: Payer: Self-pay | Admitting: Physician Assistant

## 2017-07-04 ENCOUNTER — Ambulatory Visit (INDEPENDENT_AMBULATORY_CARE_PROVIDER_SITE_OTHER): Payer: BLUE CROSS/BLUE SHIELD | Admitting: Physician Assistant

## 2017-07-04 ENCOUNTER — Encounter: Payer: Self-pay | Admitting: Physician Assistant

## 2017-07-04 VITALS — BP 128/88 | HR 101 | Temp 97.6°F | Resp 16 | Wt 221.4 lb

## 2017-07-04 DIAGNOSIS — I10 Essential (primary) hypertension: Secondary | ICD-10-CM | POA: Diagnosis not present

## 2017-07-04 DIAGNOSIS — G43809 Other migraine, not intractable, without status migrainosus: Secondary | ICD-10-CM

## 2017-07-04 MED ORDER — NORETHIN-ETH ESTRAD-FE BIPHAS 1 MG-10 MCG / 10 MCG PO TABS: 1.0000 | ORAL_TABLET | Freq: Every day | ORAL | 1 refills | Status: DC

## 2017-07-04 NOTE — Progress Notes (Signed)
   Assessment and Plan: Essential hypertension Continue medications  Other migraine without status migrainosus, not intractable Controlled  Morbid obesity ([place]) Get on naltrezone 2.5mg , can increase to 5mg  - follow up 4-6 weeks for progress monitoring - increase veggies, decrease carbs - long discussion about weight loss, diet, and exercise  BCP -     Norethindrone-Ethinyl Estradiol-Fe Biphas (LO LOESTRIN FE) 1 MG-10 MCG / 10 MCG tablet; Take 1 tablet by mouth daily.   HPI 37 y.o.female presents for 1 month follow up for phentermine, she took for 2 weeks and stopped. Felt it was not helping. She has started back to school, increase stress and admits to stress eating. She eats 3 meals a day, she drinks 60 oz of water a day, she does eat nuts liberally, she will have 2 sweet tea a week, no diet soda. Has tried wellbutrin, celexa, zoloft, lexapro without help.   BMI is Body mass index is 38 kg/m., she is working on diet and exercise. Wt Readings from Last 3 Encounters:  07/04/17 221 lb 6.4 oz (100.4 kg)  02/23/17 208 lb 3.2 oz (94.4 kg)  08/03/16 202 lb 12.8 oz (92 kg)   Blood pressure 128/88, pulse (!) 101, temperature 97.6 F (36.4 C), resp. rate 16, weight 221 lb 6.4 oz (100.4 kg), last menstrual period 07/03/2017, SpO2 98 %.    Past Medical History:  Diagnosis Date  . 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 Migraines      No Known Allergies  Current Outpatient Medications on File Prior to Visit  Medication Sig  . verapamil (CALAN-SR) 240 MG CR tablet TAKE 1 TABLET BY MOUTH AT BEDTIME   No current facility-administered medications on file prior to visit.     ROS: all negative except above.   Physical Exam: Filed Weights   07/04/17 1547  Weight: 221 lb 6.4 oz (100.4 kg)   BP 128/88   Pulse (!) 101   Temp 97.6 F (36.4 C)   Resp 16   Wt 221 lb 6.4 oz (100.4 kg)   LMP 07/03/2017   SpO2 98%   BMI 38.00 kg/m  General Appearance: Well  nourished, in no apparent distress. Eyes: PERRLA, EOMs, conjunctiva no swelling or erythema Sinuses: No Frontal/maxillary tenderness ENT/Mouth: Ext aud canals clear, TMs without erythema, bulging. No erythema, swelling, or exudate on post pharynx.  Tonsils not swollen or erythematous. Hearing normal.  Neck: Supple, thyroid normal.  Respiratory: Respiratory effort normal, BS equal bilaterally without rales, rhonchi, wheezing or stridor.  Cardio: RRR with no MRGs. Brisk peripheral pulses without edema.  Abdomen: Soft, + BS.  Non tender, no guarding, rebound, hernias, masses. Lymphatics: Non tender without lymphadenopathy.  Musculoskeletal: Full ROM, 5/5 strength, normal gait.  Skin: Warm, dry without rashes, lesions, ecchymosis.  Neuro: Cranial nerves intact. Normal muscle tone, no cerebellar symptoms. Sensation intact.  Psych: Awake and oriented X 3, normal affect, Insight and Judgment appropriate.     Vicie Mutters, PA-C 4:07 PM [facility]

## 2017-07-04 NOTE — Patient Instructions (Signed)
Are you an emotional eater? °Do you eat more when you’re feeling stressed? °Do you eat when you’re not hungry or when you’re full? °Do you eat to feel better (to calm and soothe yourself when you’re sad, mad, bored, anxious, etc.)? °Do you reward yourself with food? °Do you regularly eat until you’ve stuffed yourself? °Does food make you feel safe? Do you feel like food is a friend? °Do you feel powerless or out of control around food? ° °If you answered yes to some of these questions than it is likely that you are an emotional eater. This is normally a learned behavior and can take time to first recognize the signs and second BREAK THE HABIT. But here is more information and tips to help.  ° °The difference between emotional hunger and physical hunger °Emotional hunger can be powerful, so it’s easy to mistake it for physical hunger. But there are clues you can look for to help you tell physical and emotional hunger apart. ° °Emotional hunger comes on suddenly. It hits you in an instant and feels overwhelming and urgent. Physical hunger, on the other hand, comes on more gradually. The urge to eat doesn’t feel as dire or demand instant satisfaction (unless you haven’t eaten for a very long time). ° °Emotional hunger craves specific comfort foods. When you’re physically hungry, almost anything sounds good--including healthy stuff like vegetables. But emotional hunger craves junk food or sugary snacks that provide an instant rush. You feel like you need cheesecake or pizza, and nothing else will do. ° °Emotional hunger often leads to mindless eating. Before you know it, you’ve eaten a whole bag of chips or an entire pint of ice cream without really paying attention or fully enjoying it. When you’re eating in response to physical hunger, you’re typically more aware of what you’re doing. ° °Emotional hunger isn’t satisfied once you’re full. You keep wanting more and more, often eating until you’re uncomfortably stuffed.  Physical hunger, on the other hand, doesn’t need to be stuffed. You feel satisfied when your stomach is full. ° °Emotional hunger isn’t located in the stomach. Rather than a growling belly or a pang in your stomach, you feel your hunger as a craving you can’t get out of your head. You’re focused on specific textures, tastes, and smells. ° °Emotional hunger often leads to regret, guilt, or shame. When you eat to satisfy physical hunger, you’re unlikely to feel guilty or ashamed because you’re simply giving your body what it needs. If you feel guilty after you eat, it’s likely because you know deep down that you’re not eating for nutritional reasons. ° °Identify your emotional eating triggers °What situations, places, or feelings make you reach for the comfort of food? Most emotional eating is linked to unpleasant feelings, but it can also be triggered by positive emotions, such as rewarding yourself for achieving a goal or celebrating a holiday or happy event. Common causes of emotional eating include: ° °Stuffing emotions - Eating can be a way to temporarily silence or “stuff down” uncomfortable emotions, including anger, fear, sadness, anxiety, loneliness, resentment, and shame. While you’re numbing yourself with food, you can avoid the difficult emotions you’d rather not feel. ° °Boredom or feelings of emptiness - Do you ever eat simply to give yourself something to do, to relieve boredom, or as a way to fill a void in your life? You feel unfulfilled and empty, and food is a way to occupy your mouth and your time. In the moment,   it fills you up and distracts you from underlying feelings of purposelessness and dissatisfaction with your life. ° °Childhood habits - Think back to your childhood memories of food. Did your parents reward good behavior with ice cream, take you out for pizza when you got a good report card, or serve you sweets when you were feeling sad? These habits can often carry over into adulthood. Or  your eating may be driven by nostalgia--for cherished memories of grilling burgers in the backyard with your dad or baking and eating cookies with your mom. ° °Social influences - Getting together with other people for a meal is a great way to relieve stress, but it can also lead to overeating. It’s easy to overindulge simply because the food is there or because everyone else is eating. You may also overeat in social situations out of nervousness. Or perhaps your family or circle of friends encourages you to overeat, and it’s easier to go along with the group. ° °Stress - Ever notice how stress makes you hungry? It’s not just in your mind. When stress is chronic, as it so often is in our chaotic, fast-paced world, your body produces high levels of the stress hormone, cortisol. Cortisol triggers cravings for salty, sweet, and fried foods--foods that give you a burst of energy and pleasure. The more uncontrolled stress in your life, the more likely you are to turn to food for emotional relief. ° °Find other ways to feed your feelings °If you don’t know how to manage your emotions in a way that doesn’t involve food, you won’t be able to control your eating habits for very long. Diets so often fail because they offer logical nutritional advice which only works if you have conscious control over your eating habits. It doesn’t work when emotions hijack the process, demanding an immediate payoff with food. ° °In order to stop emotional eating, you have to find other ways to fulfill yourself emotionally. It’s not enough to understand the cycle of emotional eating or even to understand your triggers, although that’s a huge first step. You need alternatives to food that you can turn to for emotional fulfillment. ° °Alternatives to emotional eating °If you’re depressed or lonely, call someone who always makes you feel better, play with your dog or cat, or look at a favorite photo or cherished memento. ° °If you’re anxious,  expend your nervous energy by dancing to your favorite song, squeezing a stress ball, or taking a brisk walk. ° °If you’re exhausted, treat yourself with a hot cup of tea, take a bath, light some scented candles, or wrap yourself in a warm blanket. ° °If you’re bored, read a good book, watch a comedy show, explore the outdoors, or turn to an activity you enjoy (woodworking, playing the guitar, shooting hoops, scrapbooking, etc.). ° °What is mindful eating? °Mindful eating is a practice that develops your awareness of eating habits and allows you to pause between your triggers and your actions. Most emotional eaters feel powerless over their food cravings. When the urge to eat hits, you feel an almost unbearable tension that demands to be fed, right now. Because you’ve tried to resist in the past and failed, you believe that your willpower just isn’t up to snuff. But the truth is that you have more power over your cravings than you think. ° °Take 5 before you give in to a craving °Emotional eating tends to be automatic and virtually mindless. Before you even realize what you’re doing, you’ve   reached for a tub of ice cream and polished off half of it. But if you can take a moment to pause and reflect when you're hit with a craving, you give yourself the opportunity to make a different decision.  Can you put off eating for five minutes? Or just start with one minute. Don't tell yourself you can't give in to the craving; remember, the forbidden is extremely tempting. Just tell yourself to wait.  While you're waiting, check in with yourself. How are you feeling? What's going on emotionally? Even if you end up eating, you'll have a better understanding of why you did it. This can help you set yourself up for a different response next time.  How to practice mindful eating Eating while you're also doing other things-such as watching TV, driving, or playing with your phone-can prevent you from fully enjoying your  food. Since your mind is elsewhere, you may not feel satisfied or continue eating even though you're no longer hungry. Eating more mindfully can help focus your mind on your food and the pleasure of a meal and curb overeating.   Eat your meals in a calm place with no distractions, aside from any dining companions.  Try eating with your non-dominant hand or using chopsticks instead of a knife and fork. Eating in such a non-familiar way can slow down how fast you eat and ensure your mind stays focused on your food.  Allow yourself enough time not to have to rush your meal. Set a timer for 20 minutes and pace yourself so you spend at least that much time eating.  Take small bites and chew them well, taking time to notice the different flavors and textures of each mouthful.  Put your utensils down between bites. Take time to consider how you feel-hungry, satiated-before picking up your utensils again.  Try to stop eating before you are full.It takes time for the signal to reach your brain that you've had enough. Don't feel obligated to always clean your plate.  When you've finished your food, take a few moments to assess if you're really still hungry before opting for an extra serving or dessert.  Learn to accept your feelings-even the bad ones  While it may seem that the core problem is that you're powerless over food, emotional eating actually stems from feeling powerless over your emotions. You don't feel capable of dealing with your feelings head on, so you avoid them with food.  Recommended reading  Mini Habits for weight loss  Healthy Eating: A guide to the new nutrition - [place] Report  10 Tips for Mindful Eating - How mindfulness can help you fully enjoy a meal and the experience of eating-with moderation and restraint. ([place])  Weight Loss: Gain Control of Emotional Eating - Tips to regain control of your eating habits. [facility])  Why Stress Causes People to Overeat -Tips on controlling stress eating. ([place])  Mindful Eating Meditations -Free online mindfulness meditations. ([facility] [employer])    Being dehydrated can hurt your kidneys, cause fatigue, headaches, muscle aches, joint pain, and dry skin/nails so please increase your fluids.   Drink 80-100 oz a day of water, measure it out! Eat 3 meals a day, have to do breakfast, eat protein- hard boiled eggs, protein bar like nature valley protein bar, greek yogurt like oikos triple zero, chobani 100, or light n fit greek  Can check out plantnanny app on your phone  to help you keep track of your water      When it comes to diets, agreement about the perfect plan isn't easy to find, even among the experts. Experts at the [employer] developed an idea known as the Healthy Eating Plate. Just imagine a plate divided into logical, healthy portions.  The emphasis is on diet quality:  Load up on vegetables and fruits - one-half of your plate: Aim for color and variety, and remember that potatoes don't count.  Go for whole grains - one-quarter of your plate: Whole wheat, barley, wheat berries, quinoa, oats, brown rice, and foods made with them. If you want pasta, go with whole wheat pasta.  Protein power - one-quarter of your plate: Fish, chicken, beans, and nuts are all healthy, versatile protein sources. Limit red meat.  The diet, however, does go beyond the plate, offering a few other suggestions.  Use healthy plant oils, such as olive, canola, soy, corn, sunflower and peanut. Check the labels, and avoid partially hydrogenated oil, which have unhealthy trans fats.  If you're thirsty, drink water. Coffee and tea are good in moderation, but skip sugary drinks and limit milk and dairy products to one or two daily servings.  The type of carbohydrate in the diet is more important than the amount. Some sources  of carbohydrates, such as vegetables, fruits, whole grains, and beans-are healthier than others.  Finally, stay active.  Nuts are a healthy fat that can help you feel full HOWEVER they pack a lot of calories in a small amount. For weight loss, I suggest no more than 1 serving of nuts a day. A handful goes a long way. Here are some examples of the different types of nuts and the suggested serving a day.

## 2017-07-06 ENCOUNTER — Encounter: Payer: Self-pay | Admitting: Physician Assistant

## 2017-07-08 ENCOUNTER — Other Ambulatory Visit: Payer: Self-pay | Admitting: Physician Assistant

## 2017-07-08 MED ORDER — NALTREXONE HCL 50 MG PO TABS: 25.0000 mg | ORAL_TABLET | Freq: Every day | ORAL | 0 refills | Status: DC

## 2017-07-08 MED ORDER — NORGESTIM-ETH ESTRAD TRIPHASIC 0.18/0.215/0.25 MG-25 MCG PO TABS: 1.0000 | ORAL_TABLET | Freq: Every day | ORAL | 6 refills | Status: DC

## 2017-08-05 ENCOUNTER — Encounter: Payer: Self-pay | Admitting: Physician Assistant

## 2017-08-05 NOTE — Progress Notes (Deleted)
Complete physical  Assessment and Plan:  Hypertension -Continue medication, monitor blood pressure at home. Continue DASH diet.  Reminder to go to the ER if any CP, SOB, nausea, dizziness, severe HA, changes vision/speech, left arm numbness and tingling and jaw pain.  Screen Cholesterol -Continue diet and exercise. Check cholesterol.   Obesity  long discussion about weight loss, diet, and exercise  Vitamin D Def - check level and continue medications.   Migraines, Given information about prevention, continue verapamil and effexor, if not better can refer to HA clinic  Depression effexor increase to 75mg   Screening cholesterol level -     Lipid panel  Screening for diabetes mellitus -     Hemoglobin A1c  Medication management -     Magnesium   Continue diet and meds as discussed. Further disposition pending results of labs. Over 30 minutes of exam, counseling, chart review, and critical decision making was performed Labs done at [facility] Future Appointments  Date Time Provider [place]  08/07/2017  3:00 PM Vicie Mutters, PA-C [facility] None  08/15/2017  3:00 PM Vicie Mutters, PA-C [facility] None    HPI 37 y.o. female  presents for CPE and 3 month follow up on hypertension, cholesterol, prediabetes, and vitamin D deficiency.   Her blood pressure has been controlled at home, today their BP is    She is on verapamil and effexor, has HA 3-4 x a week, will take ibuprofen those times, HA not lasting as long.   She does workout. She denies chest pain, shortness of breath, dizziness.  She is not on cholesterol medication and denies myalgias. Her cholesterol is at goal. The cholesterol last visit was:   Lab Results  Component Value Date   CHOL 172 08/03/2016   HDL 70 08/03/2016   LDLCALC 82 08/03/2016   TRIG 100 08/03/2016   CHOLHDL 2.5 08/03/2016    She has been working on diet and exercise for prediabetes, and denies paresthesia of the feet, polydipsia,  polyuria and visual disturbances. Last A1C in the office was:  Lab Results  Component Value Date   HGBA1C 5.1 08/03/2016   Patient is on Vitamin D supplement, she is on vitamin D 50,000 once a week for 3 months but then stopped.  Lab Results  Component Value Date   VD25OH 29 (L) 08/03/2016     BMI is There is no height or weight on file to calculate BMI., she is working on diet and exercise. She openly admits to increased eating with stress and will drink soda, phentermine does not help anymore, can not get contrave or belviq due to insurance constraints.   She still hates her job, has increased, her husband has two jobs and is gone a lot. Son is now toddle and talking.  Wt Readings from Last 3 Encounters:  07/04/17 221 lb 6.4 oz (100.4 kg)  02/23/17 208 lb 3.2 oz (94.4 kg)  08/03/16 202 lb 12.8 oz (92 kg)     Current Medications:  Current Outpatient Medications on File Prior to Visit  Medication Sig Dispense Refill  . naltrexone (DEPADE) 50 MG tablet Take 0.5 tablets (25 mg total) by mouth daily before supper. 30 tablet 0  . Norgestimate-Ethinyl Estradiol Triphasic 0.18/0.215/0.25 MG-25 MCG tab Take 1 tablet by mouth daily. 1 Package 6  . verapamil (CALAN-SR) 240 MG CR tablet TAKE 1 TABLET BY MOUTH AT BEDTIME 90 tablet 1   No current facility-administered medications on file prior to visit.    Medical History:  Past Medical History:  Diagnosis Date  . 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 Migraines   . Preeclampsia 05/28/2013   Preventative medicine There is no immunization history for the selected administration types on file for this patient.  TDAP 2015 Influenza 2017 Pneumonia N/A Prevnar N/A age 87 Zoster N/A age 42-60  No LMP recorded. MGM N/A PAP 2016, due next year Colonoscopy N/A  Allergies No Known Allergies  SURGICAL HISTORY She  has a past surgical history that includes Nasal septum surgery and deviated septum (2004). FAMILY  HISTORY Her family history includes Asthma in her sister and sister; Cancer in her paternal grandmother; Diabetes in her maternal grandmother; Hypertension in her brother, maternal grandmother, and mother; Mental illness in her maternal grandmother. SOCIAL HISTORY She  reports that she has never smoked. She has never used smokeless tobacco. She reports that she does not drink alcohol or use drugs.  Review of Systems:  Review of Systems  Constitutional: Negative.  Negative for chills, diaphoresis, fever and weight loss.  HENT: Negative for congestion, ear discharge, ear pain, hearing loss, nosebleeds, sore throat and tinnitus.   Eyes: Negative.   Respiratory: Negative.  Negative for stridor.   Cardiovascular: Negative.   Gastrointestinal: Negative.  Negative for abdominal pain, blood in stool, constipation, diarrhea, heartburn, melena, nausea and vomiting.  Genitourinary: Negative.  Negative for dysuria, flank pain, frequency, hematuria and urgency.  Musculoskeletal: Negative.  Negative for back pain, falls, joint pain, myalgias and neck pain.  Skin: Negative.   Neurological: Positive for headaches. Negative for dizziness, tingling, tremors, sensory change, speech change, focal weakness, seizures, loss of consciousness and weakness.  Endo/Heme/Allergies: Negative.   Psychiatric/Behavioral: Positive for depression. Negative for hallucinations, memory loss, substance abuse and suicidal ideas. The patient has insomnia.    Physical Exam: There were no vitals taken for this visit. Wt Readings from Last 3 Encounters:  07/04/17 221 lb 6.4 oz (100.4 kg)  02/23/17 208 lb 3.2 oz (94.4 kg)  08/03/16 202 lb 12.8 oz (92 kg)   General Appearance: Well nourished, in no apparent distress. Eyes: PERRLA, EOMs, conjunctiva no swelling or erythema Sinuses: No Frontal/maxillary tenderness ENT/Mouth: Ext aud canals clear, TMs without erythema, bulging. No erythema, swelling, or exudate on post pharynx.   Tonsils not swollen or erythematous. Hearing normal.  Neck: Supple, thyroid normal.  Respiratory: Respiratory effort normal, BS equal bilaterally without rales, rhonchi, wheezing or stridor.  Cardio: RRR with no MRGs. Brisk peripheral pulses without edema.  Abdomen: Soft, + BS,  Non tender, no guarding, rebound, hernias, masses. Lymphatics: Non tender without lymphadenopathy.  Musculoskeletal: Full ROM, 5/5 strength, Normal gait Skin: Warm, dry without rashes, lesions, ecchymosis.  Neuro: Cranial nerves intact. Normal muscle tone, no cerebellar symptoms. Psych: Awake and oriented X 3, normal affect, Insight and Judgment appropriate.    Vicie Mutters, PA-C 8:31 AM [facility]

## 2017-08-07 ENCOUNTER — Encounter: Payer: Self-pay | Admitting: Physician Assistant

## 2017-08-15 ENCOUNTER — Ambulatory Visit: Payer: Self-pay | Admitting: Physician Assistant

## 2017-08-30 ENCOUNTER — Ambulatory Visit: Payer: Self-pay | Admitting: Physician Assistant

## 2017-09-12 NOTE — Progress Notes (Signed)
Assessment and Plan:   Morbid obesity ([place]) Long discussion about weight loss, diet, and exercise, emphasizing limitations of medication and importance of lifestyle modification component in long run, avoiding "diet" mentality Discussed final goal weight and current weight loss goal (214 lb) Patient will work on not "giving up" after making a "bad" choice - continue with efforts even if she "messes up." Switch from sugar in coffee to stevia/truvia  Take cheeze-its in preportioned bags to work to avoid continuous nibbling Patient has been phentermine with benefit and no SE; will restart; reminded she needs to take drug breaks; continue close follow up. Return in 3 months.  -     phentermine (ADIPEX-P) 37.5 MG tablet; Take 1/2 to 1 tablet every morning for dieting & weightloss  Further disposition pending results of labs. Discussed med's effects and SE's.   Over 30 minutes of exam, counseling, chart review, and critical decision making was performed.   Future Appointments  Date Time Provider [place]  01/03/2018  8:45 AM Liane Comber, NP [facility] None   ------------------------------------------------------------------------------------------------------------------   HPI 37 y.o.female presents for follow up on morbid obesity.   she is prescribed naltrexone 50 mg for weight loss as of 6 weeks ago. She was on phentermine previously with reported significant weight loss, but tried for 2 weeks prior to naltrexone and didn't perceive any benefit/weight loss (admittedly was not working on diet/exercise, eating lots of cheeze-its). While on the medication they have lost 2 lbs since last visit. They deny palpitations, anxiety, trouble sleeping, elevated BP or other SE, but after discussion she would like to resume phentermine due to cost. Discussed at length that with either medication lifestyle modification (particularly diet) must be modified for weight loss to occur.   BMI is Body  mass index is 37.59 kg/m., she is working on diet and exercise. Wt Readings from Last 3 Encounters:  09/13/17 219 lb (99.3 kg)  07/04/17 221 lb 6.4 oz (100.4 kg)  02/23/17 208 lb 3.2 oz (94.4 kg)   Typical breakfast: Hard boiled eggs - 2-3, coffee/tea with cream/sugar AM snack: cheeze-its (2 servings) Typical lunch: [employer] - plain chicken wings, celery Typical dinner: a protein (chicken, etc) with vegetable and salads Snack: apples, cuties, bananas Exercise: Joined a gym - walking 15 min x 2 daily Water intake: Water, sweat tea at lunch   Past Medical History:  Diagnosis Date  . 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 Migraines   . Preeclampsia 05/28/2013     No Known Allergies  Current Outpatient Medications on File Prior to Visit  Medication Sig  . Multiple Vitamin (MULTIVITAMIN) tablet Take 1 tablet by mouth daily.  . Norgestimate-Ethinyl Estradiol Triphasic 0.18/0.215/0.25 MG-25 MCG tab Take 1 tablet by mouth daily.  . verapamil (CALAN-SR) 240 MG CR tablet TAKE 1 TABLET BY MOUTH AT BEDTIME   No current facility-administered medications on file prior to visit.     ROS: all negative except above.   Physical Exam:  BP 112/72   Pulse 94   Temp (!) 97.3 F (36.3 C)   Ht 5\' 4"  (1.626 m)   Wt 219 lb (99.3 kg)   SpO2 97%   BMI 37.59 kg/m   General Appearance: Well nourished, in no apparent distress. Eyes: PERRLA, conjunctiva no swelling or erythema ENT/Mouth: No erythema, swelling, or exudate on post pharynx.  Tonsils not swollen or erythematous. Hearing normal.  Neck: Supple, thyroid normal.  Respiratory: Respiratory effort normal, BS equal bilaterally without rales,  rhonchi, wheezing or stridor.  Cardio: RRR with no MRGs. Brisk peripheral pulses without edema.  Abdomen: Soft, + BS.  Non tender. Musculoskeletal: normal gait.  Skin: Warm, dry without rashes, lesions, ecchymosis.  Psych: Awake and oriented X 3, normal affect, Insight and  Judgment appropriate.     Izora Ribas, NP 12:36 PM [facility]

## 2017-09-13 ENCOUNTER — Encounter (INDEPENDENT_AMBULATORY_CARE_PROVIDER_SITE_OTHER): Payer: Self-pay

## 2017-09-13 ENCOUNTER — Ambulatory Visit (INDEPENDENT_AMBULATORY_CARE_PROVIDER_SITE_OTHER): Payer: BLUE CROSS/BLUE SHIELD | Admitting: Adult Health

## 2017-09-13 ENCOUNTER — Encounter: Payer: Self-pay | Admitting: Adult Health

## 2017-09-13 MED ORDER — PHENTERMINE HCL 37.5 MG PO TABS: ORAL_TABLET | ORAL | 2 refills | Status: DC

## 2017-09-13 NOTE — Patient Instructions (Signed)
When you mess up : "Today may not be a positive day, but it can be neutral and it don't need to be negative"     Aim for 7+ servings of fruits and vegetables daily  80+ fluid ounces of water or unsweet tea for healthy kidneys  Avoid alcohol  Limit animal fats in diet for cholesterol and heart health - choose grass fed whenever available  Aim for low stress - take time to unwind and care for your mental health  Aim for 150 min of moderate intensity exercise weekly for heart health, and weights twice weekly for bone health  Aim for 7-9 hours of sleep daily      When it comes to diets, agreement about the perfect plan isn't easy to find, even among the experts. Experts at the [employer] developed an idea known as the Healthy Eating Plate. Just imagine a plate divided into logical, healthy portions.  The emphasis is on diet quality:  Load up on vegetables and fruits - one-half of your plate: Aim for color and variety, and remember that potatoes don't count.  Go for whole grains - one-quarter of your plate: Whole wheat, barley, wheat berries, quinoa, oats, brown rice, and foods made with them. If you want pasta, go with whole wheat pasta.  Protein power - one-quarter of your plate: Fish, chicken, beans, and nuts are all healthy, versatile protein sources. Limit red meat.  The diet, however, does go beyond the plate, offering a few other suggestions.  Use healthy plant oils, such as olive, canola, soy, corn, sunflower and peanut. Check the labels, and avoid partially hydrogenated oil, which have unhealthy trans fats.  If you're thirsty, drink water. Coffee and tea are good in moderation, but skip sugary drinks and limit milk and dairy products to one or two daily servings.  The type of carbohydrate in the diet is more important than the amount. Some sources of carbohydrates, such as vegetables, fruits, whole grains, and beans-are healthier than  others.  Finally, stay active.    Are you an emotional eater? Do you eat more when you're feeling stressed? Do you eat when you're not hungry or when you're full? Do you eat to feel better (to calm and soothe yourself when you're sad, mad, bored, anxious, etc.)? Do you reward yourself with food? Do you regularly eat until you've stuffed yourself? Does food make you feel safe? Do you feel like food is a friend? Do you feel powerless or out of control around food?  If you answered yes to some of these questions than it is likely that you are an emotional eater. This is normally a learned behavior and can take time to first recognize the signs and second BREAK THE HABIT. But here is more information and tips to help.   The difference between emotional hunger and physical hunger Emotional hunger can be powerful, so it's easy to mistake it for physical hunger. But there are clues you can look for to help you tell physical and emotional hunger apart.  Emotional hunger comes on suddenly. It hits you in an instant and feels overwhelming and urgent. Physical hunger, on the other hand, comes on more gradually. The urge to eat doesn't feel as dire or demand instant satisfaction (unless you haven't eaten for a very long time).  Emotional hunger craves specific comfort foods. When you're physically hungry, almost anything sounds good-including healthy stuff like vegetables. But emotional hunger craves junk food or sugary snacks  that provide an instant rush. You feel like you need cheesecake or pizza, and nothing else will do.  Emotional hunger often leads to mindless eating. Before you know it, you've eaten a whole bag of chips or an entire pint of ice cream without really paying attention or fully enjoying it. When you're eating in response to physical hunger, you're typically more aware of what you're doing.  Emotional hunger isn't satisfied once you're full. You keep wanting more and more, often  eating until you're uncomfortably stuffed. Physical hunger, on the other hand, doesn't need to be stuffed. You feel satisfied when your stomach is full.  Emotional hunger isn't located in the stomach. Rather than a growling belly or a pang in your stomach, you feel your hunger as a craving you can't get out of your head. You're focused on specific textures, tastes, and smells.  Emotional hunger often leads to regret, guilt, or shame. When you eat to satisfy physical hunger, you're unlikely to feel guilty or ashamed because you're simply giving your body what it needs. If you feel guilty after you eat, it's likely because you know deep down that you're not eating for nutritional reasons.  Identify your emotional eating triggers What situations, places, or feelings make you reach for the comfort of food? Most emotional eating is linked to unpleasant feelings, but it can also be triggered by positive emotions, such as rewarding yourself for achieving a goal or celebrating a holiday or happy event. Common causes of emotional eating include:  Stuffing emotions - Eating can be a way to temporarily silence or "stuff down" uncomfortable emotions, including anger, fear, sadness, anxiety, loneliness, resentment, and shame. While you're numbing yourself with food, you can avoid the difficult emotions you'd rather not feel.  Boredom or feelings of emptiness - Do you ever eat simply to give yourself something to do, to relieve boredom, or as a way to fill a void in your life? You feel unfulfilled and empty, and food is a way to occupy your mouth and your time. In the moment, it fills you up and distracts you from underlying feelings of purposelessness and dissatisfaction with your life.  Childhood habits - Think back to your childhood memories of food. Did your parents reward good behavior with ice cream, take you out for pizza when you got a good report card, or serve you sweets when you were feeling sad? These  habits can often carry over into adulthood. Or your eating may be driven by nostalgia-for cherished memories of grilling burgers in the backyard with your dad or baking and eating cookies with your mom.  Social influences - Getting together with other people for a meal is a great way to relieve stress, but it can also lead to overeating. It's easy to overindulge simply because the food is there or because everyone else is eating. You may also overeat in social situations out of nervousness. Or perhaps your family or circle of friends encourages you to overeat, and it's easier to go along with the group.  Stress - Ever notice how stress makes you hungry? It's not just in your mind. When stress is chronic, as it so often is in our chaotic, fast-paced world, your body produces high levels of the stress hormone, cortisol. Cortisol triggers cravings for salty, sweet, and fried foods-foods that give you a burst of energy and pleasure. The more uncontrolled stress in your life, the more likely you are to turn to food for emotional relief.  Find other ways to feed your feelings If you don't know how to manage your emotions in a way that doesn't involve food, you won't be able to control your eating habits for very long. Diets so often fail because they offer logical nutritional advice which only works if you have conscious control over your eating habits. It doesn't work when emotions hijack the process, demanding an immediate payoff with food.  In order to stop emotional eating, you have to find other ways to fulfill yourself emotionally. It's not enough to understand the cycle of emotional eating or even to understand your triggers, although that's a huge first step. You need alternatives to food that you can turn to for emotional fulfillment.  Alternatives to emotional eating If you're depressed or lonely, call someone who always makes you feel better, play with your dog or cat, or look at a favorite photo or  cherished memento.  If you're anxious, expend your nervous energy by dancing to your favorite song, squeezing a stress ball, or taking a brisk walk.  If you're exhausted, treat yourself with a hot cup of tea, take a bath, light some scented candles, or wrap yourself in a warm blanket.  If you're bored, read a good book, watch a comedy show, explore the outdoors, or turn to an activity you enjoy (woodworking, playing the guitar, shooting hoops, scrapbooking, etc.).  What is mindful eating? Mindful eating is a practice that develops your awareness of eating habits and allows you to pause between your triggers and your actions. Most emotional eaters feel powerless over their food cravings. When the urge to eat hits, you feel an almost unbearable tension that demands to be fed, right now. Because you've tried to resist in the past and failed, you believe that your willpower just isn't up to snuff. But the truth is that you have more power over your cravings than you think.  Take 5 before you give in to a craving Emotional eating tends to be automatic and virtually mindless. Before you even realize what you're doing, you've reached for a tub of ice cream and polished off half of it. But if you can take a moment to pause and reflect when you're hit with a craving, you give yourself the opportunity to make a different decision.  Can you put off eating for five minutes? Or just start with one minute. Don't tell yourself you can't give in to the craving; remember, the forbidden is extremely tempting. Just tell yourself to wait.  While you're waiting, check in with yourself. How are you feeling? What's going on emotionally? Even if you end up eating, you'll have a better understanding of why you did it. This can help you set yourself up for a different response next time.  How to practice mindful eating Eating while you're also doing other things-such as watching TV, driving, or playing with your phone-can  prevent you from fully enjoying your food. Since your mind is elsewhere, you may not feel satisfied or continue eating even though you're no longer hungry. Eating more mindfully can help focus your mind on your food and the pleasure of a meal and curb overeating.   Eat your meals in a calm place with no distractions, aside from any dining companions.  Try eating with your non-dominant hand or using chopsticks instead of a knife and fork. Eating in such a non-familiar way can slow down how fast you eat and ensure your mind stays focused on your food.  Allow yourself enough time not to have to rush your meal. Set a timer for 20 minutes and pace yourself so you spend at least that much time eating.  Take small bites and chew them well, taking time to notice the different flavors and textures of each mouthful.  Put your utensils down between bites. Take time to consider how you feel-hungry, satiated-before picking up your utensils again.  Try to stop eating before you are full.It takes time for the signal to reach your brain that you've had enough. Don't feel obligated to always clean your plate.  When you've finished your food, take a few moments to assess if you're really still hungry before opting for an extra serving or dessert.  Learn to accept your feelings-even the bad ones  While it may seem that the core problem is that you're powerless over food, emotional eating actually stems from feeling powerless over your emotions. You don't feel capable of dealing with your feelings head on, so you avoid them with food.  Recommended reading  Mini Habits for weight loss  Healthy Eating: A guide to the new nutrition - [place] Report  10 Tips for Mindful Eating - How mindfulness can help you fully enjoy a meal and the experience of eating-with moderation and restraint. ([place])  Weight Loss: Gain Control of Emotional Eating - Tips to regain control of  your eating habits. [facility])  Why Stress Causes People to Overeat -Tips on controlling stress eating. ([place])  Mindful Eating Meditations -Free online mindfulness meditations. ([facility] [employer])

## 2018-01-01 NOTE — Progress Notes (Signed)
Assessment and Plan:   Morbid obesity ([place]) Long discussion about weight loss, diet, and exercise, emphasizing limitations of medication and importance of lifestyle modification component in long run, avoiding "diet" mentality Discussed final goal weight and current weight loss goal (200lb) She is doing well and has lost ~9lb since last visit Patient has been phentermine with benefit and no SE; will restart; reminded she needs to take drug breaks; continue close follow up. Return in 3 months.  -     phentermine (ADIPEX-P) 37.5 MG tablet; Take 1/2 to 1 tablet every morning for dieting & weightloss  Hypertension Continue medication Monitor blood pressure at home; call if consistently over 130/80 Continue DASH diet.   Reminder to go to the ER if any CP, SOB, nausea, dizziness, severe HA, changes vision/speech, left arm numbness and tingling and jaw pain.  Epigastric abdominal tenderness without rebound tenderness Will check labs, possible GERD, will have her try ranitidine 150 mg bid prn Follow up if not improving -     CBC with Differential/Platelet -     COMPLETE METABOLIC PANEL WITH GFR -     Amylase -     Lipase   Further disposition pending results of labs. Discussed med's effects and SE's.   Over 30 minutes of exam, counseling, chart review, and critical decision making was performed.   No future appointments. ------------------------------------------------------------------------------------------------------------------   HPI 37 y.o.female presents for follow up on morbid obesity and hypertension.  She also c/o ongoing intemittent epigastric pain ? Associated with food, but cannot specify specific foods. Ongoing for 3 months or so. She has been taking tums but unsure how much this helps. Denies fever/chills, bowel changes, urine changes. She does endorse occasional "froth" "secretions" in her throat with epigastric pain.   Today their BP is BP: 114/80  She does workout. She  denies chest pain, shortness of breath, dizziness.  she was recently restarted on phentermine for morbid obesity. She was on phentermine previously with reported significant weight loss, but tried for 2 weeks prior to naltrexone and didn't perceive any benefit/weight loss (admittedly was not working on diet/exercise, eating lots of cheeze-its). While on the naltrexone she lost 2 lbs, denied SE but requested to switch agents due to cost. She was advised that phentermine still requires dedicated lifestyle modification, patient expressed understanding and was restarted with phentermine.   While on the medication they have lost 9 lbs since last visit. They deny palpitations, anxiety, trouble sleeping, elevated BP.   BMI is Body mass index is 36.05 kg/m., she is working on diet and exercise. Wt Readings from Last 3 Encounters:  01/03/18 210 lb (95.3 kg)  09/13/17 219 lb (99.3 kg)  07/04/17 221 lb 6.4 oz (100.4 kg)   Typical breakfast: Hard boiled eggs - 2-3, coffee/tea with cream/sugar, or might have fruit and coffee AM snack: cheeze-itz, 1 serving every other day Typical lunch: leftovers from dinner, pick up from whole foods Typical dinner: might skip or eat very light Snack: apples, cuties, bananas Exercise: walking 15 min x 2 daily at work Water intake: Water (60+), cut out sweet tea   Past Medical History:  Diagnosis Date  . 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 Migraines   . Preeclampsia 05/28/2013     No Known Allergies  Current Outpatient Medications on File Prior to Visit  Medication Sig  . Multiple Vitamin (MULTIVITAMIN) tablet Take 1 tablet by mouth daily.  . Norgestimate-Ethinyl Estradiol Triphasic 0.18/0.215/0.25 MG-25 MCG tab Take  1 tablet by mouth daily.  . phentermine (ADIPEX-P) 37.5 MG tablet Take 1/2 to 1 tablet every morning for dieting & weightloss  . verapamil (CALAN-SR) 240 MG CR tablet TAKE 1 TABLET BY MOUTH AT BEDTIME   No current  facility-administered medications on file prior to visit.     ROS: Review of Systems  Constitutional: Negative for chills, fever and malaise/fatigue.  Respiratory: Negative.   Cardiovascular: Negative.   Gastrointestinal: Positive for abdominal pain. Negative for blood in stool, constipation, diarrhea, heartburn, melena, nausea and vomiting.  Genitourinary: Negative.   Musculoskeletal: Negative.   Neurological: Negative.   Psychiatric/Behavioral: Negative.      Physical Exam:  BP 114/80   Pulse (!) 103   Temp 97.7 F (36.5 C)   Ht 5\' 4"  (1.626 m)   Wt 210 lb (95.3 kg)   LMP 12/21/2017   SpO2 99%   BMI 36.05 kg/m   General Appearance: Well nourished, in no apparent distress. Eyes: PERRLA, conjunctiva no swelling or erythema ENT/Mouth: No erythema, swelling, or exudate on post pharynx.  Tonsils not swollen or erythematous. Hearing normal.  Neck: Supple, thyroid normal.  Respiratory: Respiratory effort normal, BS equal bilaterally without rales, rhonchi, wheezing or stridor.  Cardio: RRR with no MRGs. Brisk peripheral pulses without edema.  Abdomen: Soft, + BS.  Some epigastric tenderness and LUQ tenderness, Murphy's negative Musculoskeletal: normal gait.  Skin: Warm, dry without rashes, lesions, ecchymosis.  Psych: Awake and oriented X 3, normal affect, Insight and Judgment appropriate.     Kimberly Ribas, NP 9:18 AM [facility]

## 2018-01-03 ENCOUNTER — Encounter: Payer: Self-pay | Admitting: Adult Health

## 2018-01-03 ENCOUNTER — Ambulatory Visit (INDEPENDENT_AMBULATORY_CARE_PROVIDER_SITE_OTHER): Payer: BLUE CROSS/BLUE SHIELD | Admitting: Adult Health

## 2018-01-03 VITALS — BP 114/80 | HR 103 | Temp 97.7°F | Ht 64.0 in | Wt 210.0 lb

## 2018-01-03 DIAGNOSIS — R10816 Epigastric abdominal tenderness: Secondary | ICD-10-CM

## 2018-01-03 DIAGNOSIS — I10 Essential (primary) hypertension: Secondary | ICD-10-CM | POA: Diagnosis not present

## 2018-01-03 MED ORDER — RANITIDINE HCL 150 MG PO TABS: 150.0000 mg | ORAL_TABLET | Freq: Two times a day (BID) | ORAL | 1 refills | Status: DC | PRN

## 2018-01-03 NOTE — Patient Instructions (Signed)

## 2018-01-04 LAB — COMPLETE METABOLIC PANEL WITH GFR
AG Ratio: 1.3 (calc) (ref 1.0–2.5)
ALT: 16 U/L (ref 6–29)
AST: 14 U/L (ref 10–30)
Albumin: 4.3 g/dL (ref 3.6–5.1)
Alkaline phosphatase (APISO): 39 U/L (ref 33–115)
BUN/Creatinine Ratio: 7 (calc) (ref 6–22)
BUN: 6 mg/dL — AB (ref 7–25)
CALCIUM: 9.8 mg/dL (ref 8.6–10.2)
CO2: 28 mmol/L (ref 20–32)
Chloride: 105 mmol/L (ref 98–110)
Creat: 0.87 mg/dL (ref 0.50–1.10)
GFR, EST NON AFRICAN AMERICAN: 86 mL/min/{1.73_m2} (ref >=60)
GFR, Est African American: 100 mL/min/{1.73_m2} (ref >=60)
GLUCOSE: 97 mg/dL (ref 65–99)
Globulin: 3.3 g/dL (calc) (ref 1.9–3.7)
Potassium: 4.7 mmol/L (ref 3.5–5.3)
Sodium: 138 mmol/L (ref 135–146)
Total Bilirubin: 0.3 mg/dL (ref 0.2–1.2)
Total Protein: 7.6 g/dL (ref 6.1–8.1)

## 2018-01-04 LAB — CBC WITH DIFFERENTIAL/PLATELET
Basophils Absolute: 46 cells/uL (ref 0–200)
Basophils Relative: 0.5 %
EOS PCT: 0.8 %
Eosinophils Absolute: 74 cells/uL (ref 15–500)
HCT: 39.6 % (ref 35.0–45.0)
Hemoglobin: 13.3 g/dL (ref 11.7–15.5)
Lymphs Abs: 2677 cells/uL (ref 850–3900)
MCH: 29.8 pg (ref 27.0–33.0)
MCHC: 33.6 g/dL (ref 32.0–36.0)
MCV: 88.8 fL (ref 80.0–100.0)
MONOS PCT: 6.8 %
MPV: 11 fL (ref 7.5–12.5)
NEUTROS ABS: 5778 {cells}/uL (ref 1500–7800)
Neutrophils Relative %: 62.8 %
Platelets: 297 10*3/uL (ref 140–400)
RBC: 4.46 10*6/uL (ref 3.80–5.10)
RDW: 12.2 % (ref 11.0–15.0)
TOTAL LYMPHOCYTE: 29.1 %
WBC mixed population: 626 cells/uL (ref 200–950)
WBC: 9.2 10*3/uL (ref 3.8–10.8)

## 2018-01-04 LAB — AMYLASE: Amylase: 52 U/L (ref 21–101)

## 2018-01-04 LAB — LIPASE: LIPASE: 12 U/L (ref 7–60)

## 2018-01-08 DIAGNOSIS — Z1389 Encounter for screening for other disorder: Secondary | ICD-10-CM | POA: Diagnosis not present

## 2018-01-08 DIAGNOSIS — Z6834 Body mass index (BMI) 34.0-34.9, adult: Secondary | ICD-10-CM | POA: Diagnosis not present

## 2018-01-08 DIAGNOSIS — Z01419 Encounter for gynecological examination (general) (routine) without abnormal findings: Secondary | ICD-10-CM | POA: Diagnosis not present

## 2018-01-08 DIAGNOSIS — Z130 Encounter for screening for diseases of the blood and blood-forming organs and certain disorders involving the immune mechanism: Secondary | ICD-10-CM | POA: Diagnosis not present

## 2018-01-08 DIAGNOSIS — Z124 Encounter for screening for malignant neoplasm of cervix: Secondary | ICD-10-CM | POA: Diagnosis not present

## 2018-01-09 DIAGNOSIS — R87610 Atypical squamous cells of undetermined significance on cytologic smear of cervix (ASC-US): Secondary | ICD-10-CM | POA: Diagnosis not present

## 2018-01-09 DIAGNOSIS — Z124 Encounter for screening for malignant neoplasm of cervix: Secondary | ICD-10-CM | POA: Diagnosis not present

## 2018-01-19 ENCOUNTER — Other Ambulatory Visit: Payer: Self-pay | Admitting: Physician Assistant

## 2018-01-31 ENCOUNTER — Other Ambulatory Visit: Payer: Self-pay | Admitting: Physician Assistant

## 2018-02-03 ENCOUNTER — Other Ambulatory Visit: Payer: Self-pay | Admitting: Internal Medicine

## 2018-03-07 ENCOUNTER — Ambulatory Visit (INDEPENDENT_AMBULATORY_CARE_PROVIDER_SITE_OTHER): Payer: BLUE CROSS/BLUE SHIELD | Admitting: Adult Health

## 2018-03-07 ENCOUNTER — Encounter: Payer: Self-pay | Admitting: Adult Health

## 2018-03-07 VITALS — BP 138/84 | HR 100 | Temp 97.5°F | Ht 64.0 in | Wt 211.6 lb

## 2018-03-07 DIAGNOSIS — R110 Nausea: Secondary | ICD-10-CM | POA: Diagnosis not present

## 2018-03-07 DIAGNOSIS — I10 Essential (primary) hypertension: Secondary | ICD-10-CM

## 2018-03-07 DIAGNOSIS — Z1329 Encounter for screening for other suspected endocrine disorder: Secondary | ICD-10-CM | POA: Diagnosis not present

## 2018-03-07 MED ORDER — FAMOTIDINE 20 MG PO TABS: 20.0000 mg | ORAL_TABLET | Freq: Two times a day (BID) | ORAL | 1 refills | Status: DC

## 2018-03-07 MED ORDER — NIFEDIPINE ER OSMOTIC RELEASE 30 MG PO TB24: 30.0000 mg | ORAL_TABLET | Freq: Every day | ORAL | 2 refills | Status: DC

## 2018-03-07 NOTE — Patient Instructions (Addendum)
Goal blood pressure < 120/80   Nifedipine extended-release tablets What is this medicine? NIFEDIPINE (nye FED i peen) is a calcium-channel blocker. It affects the amount of calcium found in your heart and muscle cells. This relaxes your blood vessels, which can reduce the amount of work the heart has to do. This medicine is used to treat high blood pressure and chest pain caused by angina. This medicine may be used for other purposes; ask your health care provider or pharmacist if you have questions. COMMON BRAND NAME(S): Adalat CC, Afeditab CR, Nifediac CC, Nifedical XL, Procardia XL What should I tell my health care provider before I take this medicine? They need to know if you have any of these conditions: -heart problems, low blood pressure, slow or irregular heartbeat -kidney disease -liver disease -previous heart attack -stomach or intestine problems -an unusual or allergic reaction to nifedipine, other medicines, foods, dyes, or preservatives -pregnant or trying to get pregnant -breast-feeding How should I use this medicine? Take this medicine by mouth with a glass of water. Follow the directions on the prescription label. Do not cut, crush or chew. Take your doses at regular intervals. Do not take your medicine more often then directed. Do not suddenly stop taking this medicine. Your doctor will tell you how much medicine to take. If your doctor wants you to stop the medicine, the dose will be slowly lowered over time to avoid any side effects. Talk to your pediatrician regarding the use of this medicine in children. Special care may be needed. Overdosage: If you think you have taken too much of this medicine contact a poison control center or emergency room at once. NOTE: This medicine is only for you. Do not share this medicine with others. What if I miss a dose? If you miss a dose, take it as soon as you can. If it is almost time for your next dose, take only that dose. Do not  take double or extra doses. What may interact with this medicine? Do not take this medicine with any of the following medications: -certain medicines for seizures like carbamazepine, phenobarbital, phenytoin -lumacaftor; ivacaftor -rifabutin -rifampin -rifapentine -St. John's Wort This medicine may also interact with the following medications: -antiviral medicines for HIV or AIDS -certain medicines for blood pressure -certain medicines for diabetes -certain medicines for erectile dysfunction -certain medicines for fungal infections like ketoconazole, fluconazole, and itraconazole -certain medicines for irregular heart beat like flecainide and quinidine -certain medicines that treat or prevent blood clots like warfarin -clarithromycin -digoxin -dolasetron -erythromycin -fluoxetine -grapefruit juice -local or general anesthetics -nefazodone -orlistat -quinupristin; dalfopristin -sirolimus -stomach acid blockers like cimetidine, ranitidine, omeprazole, or pantoprazole -tacrolimus -valproic acid This list may not describe all possible interactions. Give your health care provider a list of all the medicines, herbs, non-prescription drugs, or dietary supplements you use. Also tell them if you smoke, drink alcohol, or use illegal drugs. Some items may interact with your medicine. What should I watch for while using this medicine? Visit your doctor or health care professional for regular check ups. Check your blood pressure and pulse rate regularly. Ask your doctor or health care professional what your blood pressure and pulse rate should be and when you should contact him or her. You may get drowsy or dizzy. Do not drive, use machinery, or do anything that needs mental alertness until you know how this medicine affects you. Do not stand or sit up quickly, especially if you are an older patient. This reduces the  risk of dizzy or fainting spells. Alcohol may interfere with the effect of this  medicine. Avoid alcoholic drinks. If you are taking Procardia XL, you may notice the empty shell of the tablet in your stool. What side effects may I notice from receiving this medicine? Side effects that you should report to your doctor or health care professional as soon as possible: -blood in the urine -difficulty breathing -fast heartbeat, palpitations, irregular heartbeat, chest pain -redness, blistering, peeling or loosening of the skin, including inside the mouth -reduced amount of urine passed -skin rash -swelling of the legs and ankles Side effects that usually do not require medical attention (report to your doctor or health care professional if they continue or are bothersome): -constipation -facial flushing -headache -weakness or tiredness This list may not describe all possible side effects. Call your doctor for medical advice about side effects. You may report side effects to FDA at 1-800-FDA-1088. Where should I keep my medicine? Keep out of the reach of children. Store at room temperature below 30 degrees C (86 degrees F). Protect from moisture and humidity. Keep container tightly closed. Throw away any unused medicine after the expiration date. NOTE: This sheet is a summary. It may not cover all possible information. If you have questions about this medicine, talk to your doctor, pharmacist, or health care provider.  2018 Elsevier/Gold Standard (2014-06-15 10:25:09)

## 2018-03-07 NOTE — Progress Notes (Signed)
Assessment and Plan:  Kimberly Howard was seen today for medication management.  Diagnoses and all orders for this visit:  Hypertension, unspecified type Hx of preeclampsia, desire for pregnancy Initiate medication: -NIFEdipine (PROCARDIA-XL/NIFEDICAL-XL) 30 MG 24 hr tablet; Take 1 tablet (30 mg total) by mouth daily. Monitor blood pressure at home; call if consistently over 130/80 Discussed DASH diet Advised to go to the ER if any CP, SOB, nausea, dizziness, severe HA, changes vision/speech, left arm numbness and tingling and jaw pain. Follow up in 4 weeks -     Orthostatic vital signs   Nausea Vaguely feeling unwell, will check basic labs, at home pregnancy neg x 7 -     CBC with Differential/Platelet -     COMPLETE METABOLIC PANEL WITH GFR -     TSH -     Urinalysis w microscopic + reflex cultur  Other orders -     famotidine (PEPCID) 20 MG tablet; Take 1 tablet (20 mg total) by mouth 2 (two) times daily.   Further disposition pending results of labs. Discussed med's effects and SE's.   Over 30 minutes of exam, counseling, chart review, and critical decision making was performed.   Future Appointments  Date Time Provider [place]  03/29/2018 11:30 AM [facility] NURSE [facility] None    ------------------------------------------------------------------------------------------------------------------   HPI BP 138/84   Pulse 100   Temp (!) 97.5 F (36.4 C)   Ht 5\' 4"  (1.626 m)   Wt 211 lb 9.6 oz (96 kg)   LMP 02/22/2018 (Exact Date)   SpO2 99%   BMI 36.32 kg/m   37 y.o.female AA, with hx of preeclampsia presents for evaluation of BP; has been on verapamil for migraine until recently, but previously has been on ? procardia, hctz. She has been off of verapamil per OBGYN recommendation due to desire for pregnancy.   She reports in the last 1 month has intermittently been feeling somewhat unwell and nauseous and wondered if her blood pressure may be elevated. She denies  chest pain, dyspnea, palpitations, headaches, vision changes, sensation of dizziness, abdominal pain, has had some belching, taking famotidine PRN and this helps. Denies fever/chills, urinary changes, vaginal discharge, bowel pattern changes.    Orthostatics were taken which showed supine 128/88, P 101, sitting 128/90, P 107, standing 138/94 P 107  She is off of birth control since end of September,  Patient's last menstrual period was 02/22/2018 (exact date). She has taken pregnancy test x 7 in the past month that have been negative.    Past Medical History:  Diagnosis Date  . 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 Migraines   . Preeclampsia 05/28/2013     No Known Allergies  Current Outpatient Medications on File Prior to Visit  Medication Sig  . Multiple Vitamin (MULTIVITAMIN) tablet Take 1 tablet by mouth daily.   No current facility-administered medications on file prior to visit.     ROS: Review of Systems  Constitutional: Negative for chills, diaphoresis, fever, malaise/fatigue and weight loss.  HENT: Negative.   Eyes: Negative.   Respiratory: Negative for cough, hemoptysis, sputum production, shortness of breath and wheezing.   Cardiovascular: Positive for palpitations ("maybe once a month" ). Negative for chest pain, orthopnea, claudication, leg swelling and PND.  Gastrointestinal: Positive for nausea. Negative for abdominal pain, blood in stool, constipation, diarrhea, heartburn, melena and vomiting.  Genitourinary: Negative.  Negative for dysuria, flank pain, frequency, hematuria and urgency.  Musculoskeletal: Negative for joint pain and  myalgias.  Skin: Negative for itching and rash.  Neurological: Negative for dizziness and headaches.    Physical Exam:  BP 138/84   Pulse 100   Temp (!) 97.5 F (36.4 C)   Ht 5\' 4"  (1.626 m)   Wt 211 lb 9.6 oz (96 kg)   LMP 02/22/2018 (Exact Date)   SpO2 99%   BMI 36.32 kg/m   General Appearance: Well  nourished, in no apparent distress. Eyes: PERRLA, conjunctiva no swelling or erythema Sinuses: No Frontal/maxillary tenderness ENT/Mouth: Ext aud canals clear, TMs without erythema, bulging. No erythema, swelling, or exudate on post pharynx.  Tonsils not swollen or erythematous. Hearing normal.  Neck: Supple, thyroid normal.  Respiratory: Respiratory effort normal, BS equal bilaterally without rales, rhonchi, wheezing or stridor.  Cardio: Tachycardic with no MRGs. Brisk peripheral pulses without edema.  Abdomen: Soft, + BS.  Non tender, no guarding, rebound, hernias, masses. Lymphatics: Non tender without lymphadenopathy.  Musculoskeletal: Symmetrical strength, normal gait.  Skin: Warm, dry without rashes, lesions, ecchymosis.  Neuro: Cranial nerves intact. Normal muscle tone, no cerebellar symptoms. Sensation intact.  Psych: Awake and oriented X 3, normal affect, Insight and Judgment appropriate.    Izora Ribas, NP 4:54 PM [facility]

## 2018-03-08 LAB — URINALYSIS W MICROSCOPIC + REFLEX CULTURE
BILIRUBIN URINE: NEGATIVE
Bacteria, UA: NONE SEEN /HPF
Glucose, UA: NEGATIVE
Hgb urine dipstick: NEGATIVE
Hyaline Cast: NONE SEEN /LPF
Leukocyte Esterase: NEGATIVE
NITRITES URINE, INITIAL: NEGATIVE
PROTEIN: NEGATIVE
RBC / HPF: NONE SEEN /HPF (ref 0–2)
SQUAMOUS EPITHELIAL / LPF: NONE SEEN /HPF (ref <=5)
Specific Gravity, Urine: 1.023 (ref 1.001–1.03)
pH: 6 (ref 5.0–8.0)

## 2018-03-08 LAB — COMPLETE METABOLIC PANEL WITH GFR
AG Ratio: 1.4 (calc) (ref 1.0–2.5)
ALT: 34 U/L — ABNORMAL HIGH (ref 6–29)
AST: 22 U/L (ref 10–30)
Albumin: 4.2 g/dL (ref 3.6–5.1)
Alkaline phosphatase (APISO): 47 U/L (ref 33–115)
BUN: 9 mg/dL (ref 7–25)
CO2: 28 mmol/L (ref 20–32)
Calcium: 9.5 mg/dL (ref 8.6–10.2)
Chloride: 103 mmol/L (ref 98–110)
Creat: 0.69 mg/dL (ref 0.50–1.10)
GFR, EST NON AFRICAN AMERICAN: 113 mL/min/{1.73_m2} (ref >=60)
GFR, Est African American: 131 mL/min/{1.73_m2} (ref >=60)
GLUCOSE: 91 mg/dL (ref 65–99)
Globulin: 3.1 g/dL (calc) (ref 1.9–3.7)
Potassium: 4.2 mmol/L (ref 3.5–5.3)
Sodium: 138 mmol/L (ref 135–146)
Total Bilirubin: 0.2 mg/dL (ref 0.2–1.2)
Total Protein: 7.3 g/dL (ref 6.1–8.1)

## 2018-03-08 LAB — CBC WITH DIFFERENTIAL/PLATELET
BASOS PCT: 0.5 %
Basophils Absolute: 64 cells/uL (ref 0–200)
Eosinophils Absolute: 140 cells/uL (ref 15–500)
Eosinophils Relative: 1.1 %
HEMATOCRIT: 39.3 % (ref 35.0–45.0)
HEMOGLOBIN: 13.2 g/dL (ref 11.7–15.5)
LYMPHS ABS: 3848 {cells}/uL (ref 850–3900)
MCH: 29.3 pg (ref 27.0–33.0)
MCHC: 33.6 g/dL (ref 32.0–36.0)
MCV: 87.3 fL (ref 80.0–100.0)
MPV: 10.8 fL (ref 7.5–12.5)
Monocytes Relative: 9 %
NEUTROS ABS: 7506 {cells}/uL (ref 1500–7800)
NEUTROS PCT: 59.1 %
Platelets: 318 10*3/uL (ref 140–400)
RBC: 4.5 10*6/uL (ref 3.80–5.10)
RDW: 12.3 % (ref 11.0–15.0)
Total Lymphocyte: 30.3 %
WBC: 12.7 10*3/uL — ABNORMAL HIGH (ref 3.8–10.8)
WBCMIX: 1143 {cells}/uL — AB (ref 200–950)

## 2018-03-08 LAB — NO CULTURE INDICATED

## 2018-03-08 LAB — TSH: TSH: 1.75 mIU/L

## 2018-03-09 ENCOUNTER — Encounter: Payer: Self-pay | Admitting: Adult Health

## 2018-03-09 DIAGNOSIS — R748 Abnormal levels of other serum enzymes: Secondary | ICD-10-CM | POA: Insufficient documentation

## 2018-03-27 ENCOUNTER — Encounter: Payer: Self-pay | Admitting: Physician Assistant

## 2018-03-29 ENCOUNTER — Ambulatory Visit (INDEPENDENT_AMBULATORY_CARE_PROVIDER_SITE_OTHER): Payer: BLUE CROSS/BLUE SHIELD

## 2018-03-29 VITALS — BP 122/80 | HR 103 | Temp 97.1°F | Ht 64.0 in | Wt 213.0 lb

## 2018-03-29 DIAGNOSIS — I10 Essential (primary) hypertension: Secondary | ICD-10-CM

## 2018-03-29 NOTE — Progress Notes (Signed)
Pt reports for BLOOD PRESSURE CHECK Taken in   LEFT         arm Vital entered into chart. All questions were answered & check out slip was given to pt for check out.  pt reports she is following provider directions & feels a little better at this time.  She also reports that she would like to come in for a LAB only to check her liver, but she states that the provider would know what labs.

## 2018-04-19 ENCOUNTER — Other Ambulatory Visit: Payer: Self-pay

## 2018-05-07 DIAGNOSIS — Z3201 Encounter for pregnancy test, result positive: Secondary | ICD-10-CM | POA: Diagnosis not present

## 2018-05-07 DIAGNOSIS — N911 Secondary amenorrhea: Secondary | ICD-10-CM | POA: Diagnosis not present

## 2018-05-29 ENCOUNTER — Other Ambulatory Visit (HOSPITAL_COMMUNITY): Payer: Self-pay | Admitting: Obstetrics and Gynecology

## 2018-05-29 DIAGNOSIS — Z3A10 10 weeks gestation of pregnancy: Secondary | ICD-10-CM | POA: Diagnosis not present

## 2018-05-29 DIAGNOSIS — Z368A Encounter for antenatal screening for other genetic defects: Secondary | ICD-10-CM | POA: Diagnosis not present

## 2018-05-29 DIAGNOSIS — Z113 Encounter for screening for infections with a predominantly sexual mode of transmission: Secondary | ICD-10-CM | POA: Diagnosis not present

## 2018-05-29 DIAGNOSIS — O26891 Other specified pregnancy related conditions, first trimester: Secondary | ICD-10-CM | POA: Diagnosis not present

## 2018-05-29 DIAGNOSIS — Z3689 Encounter for other specified antenatal screening: Secondary | ICD-10-CM | POA: Diagnosis not present

## 2018-05-29 DIAGNOSIS — O09521 Supervision of elderly multigravida, first trimester: Secondary | ICD-10-CM | POA: Diagnosis not present

## 2018-05-29 DIAGNOSIS — O09291 Supervision of pregnancy with other poor reproductive or obstetric history, first trimester: Secondary | ICD-10-CM | POA: Diagnosis not present

## 2018-05-29 LAB — OB RESULTS CONSOLE GC/CHLAMYDIA
Chlamydia: NEGATIVE
Gonorrhea: NEGATIVE

## 2018-05-29 LAB — OB RESULTS CONSOLE ABO/RH: RH Type: POSITIVE

## 2018-05-29 LAB — OB RESULTS CONSOLE HIV ANTIBODY (ROUTINE TESTING): HIV: NONREACTIVE

## 2018-05-29 LAB — OB RESULTS CONSOLE RPR: RPR: NONREACTIVE

## 2018-05-29 LAB — OB RESULTS CONSOLE HEPATITIS B SURFACE ANTIGEN: Hepatitis B Surface Ag: NEGATIVE

## 2018-05-29 LAB — OB RESULTS CONSOLE RUBELLA ANTIBODY, IGM: Rubella: IMMUNE

## 2018-05-29 LAB — OB RESULTS CONSOLE ANTIBODY SCREEN: Antibody Screen: NEGATIVE

## 2018-06-04 DIAGNOSIS — R030 Elevated blood-pressure reading, without diagnosis of hypertension: Secondary | ICD-10-CM | POA: Diagnosis not present

## 2018-06-19 ENCOUNTER — Other Ambulatory Visit: Payer: Self-pay | Admitting: Adult Health

## 2018-06-19 DIAGNOSIS — I10 Essential (primary) hypertension: Secondary | ICD-10-CM

## 2018-06-21 ENCOUNTER — Encounter (HOSPITAL_COMMUNITY): Payer: Self-pay | Admitting: *Deleted

## 2018-06-24 ENCOUNTER — Encounter (HOSPITAL_COMMUNITY): Payer: Self-pay

## 2018-06-24 ENCOUNTER — Ambulatory Visit (HOSPITAL_COMMUNITY): Payer: No Typology Code available for payment source

## 2018-06-24 ENCOUNTER — Ambulatory Visit (HOSPITAL_COMMUNITY): Payer: BLUE CROSS/BLUE SHIELD

## 2018-07-25 ENCOUNTER — Other Ambulatory Visit: Payer: Self-pay | Admitting: Obstetrics and Gynecology

## 2018-07-25 DIAGNOSIS — N6310 Unspecified lump in the right breast, unspecified quadrant: Secondary | ICD-10-CM

## 2018-07-26 ENCOUNTER — Other Ambulatory Visit: Payer: Self-pay

## 2018-07-26 ENCOUNTER — Ambulatory Visit
Admission: RE | Admit: 2018-07-26 | Discharge: 2018-07-26 | Disposition: A | Discharge status: Home / Self Care | Payer: BLUE CROSS/BLUE SHIELD | Source: Ambulatory Visit | Attending: Obstetrics and Gynecology | Admitting: Obstetrics and Gynecology

## 2018-07-26 DIAGNOSIS — N6310 Unspecified lump in the right breast, unspecified quadrant: Secondary | ICD-10-CM

## 2018-07-31 DIAGNOSIS — O09512 Supervision of elderly primigravida, second trimester: Secondary | ICD-10-CM | POA: Diagnosis not present

## 2018-07-31 DIAGNOSIS — Z363 Encounter for antenatal screening for malformations: Secondary | ICD-10-CM | POA: Diagnosis not present

## 2018-07-31 DIAGNOSIS — Z3A19 19 weeks gestation of pregnancy: Secondary | ICD-10-CM | POA: Diagnosis not present

## 2018-08-12 ENCOUNTER — Other Ambulatory Visit (HOSPITAL_COMMUNITY): Payer: Self-pay | Admitting: *Deleted

## 2018-08-12 ENCOUNTER — Encounter (HOSPITAL_COMMUNITY): Payer: Self-pay

## 2018-08-12 DIAGNOSIS — O350XX0 Maternal care for (suspected) central nervous system malformation in fetus, not applicable or unspecified: Secondary | ICD-10-CM

## 2018-08-12 DIAGNOSIS — O3509X0 Maternal care for (suspected) other central nervous system malformation or damage in fetus, not applicable or unspecified: Secondary | ICD-10-CM

## 2018-08-13 ENCOUNTER — Other Ambulatory Visit: Payer: Self-pay

## 2018-08-13 ENCOUNTER — Encounter (HOSPITAL_COMMUNITY): Payer: Self-pay

## 2018-08-13 ENCOUNTER — Ambulatory Visit (HOSPITAL_COMMUNITY)
Admission: RE | Admit: 2018-08-13 | Discharge: 2018-08-13 | Disposition: A | Discharge status: Home / Self Care | Payer: BLUE CROSS/BLUE SHIELD | Source: Ambulatory Visit | Attending: Obstetrics and Gynecology | Admitting: Obstetrics and Gynecology

## 2018-08-13 ENCOUNTER — Other Ambulatory Visit (HOSPITAL_COMMUNITY): Payer: Self-pay | Admitting: Maternal & Fetal Medicine

## 2018-08-13 ENCOUNTER — Ambulatory Visit (HOSPITAL_BASED_OUTPATIENT_CLINIC_OR_DEPARTMENT_OTHER): Payer: BLUE CROSS/BLUE SHIELD | Admitting: Maternal & Fetal Medicine

## 2018-08-13 ENCOUNTER — Ambulatory Visit (HOSPITAL_COMMUNITY): Payer: BLUE CROSS/BLUE SHIELD | Admitting: *Deleted

## 2018-08-13 VITALS — BP 116/75 | HR 105 | Temp 99.1°F | Wt 209.2 lb

## 2018-08-13 DIAGNOSIS — O99212 Obesity complicating pregnancy, second trimester: Secondary | ICD-10-CM

## 2018-08-13 DIAGNOSIS — O350XX0 Maternal care for (suspected) central nervous system malformation in fetus, not applicable or unspecified: Secondary | ICD-10-CM | POA: Insufficient documentation

## 2018-08-13 DIAGNOSIS — Z3A20 20 weeks gestation of pregnancy: Secondary | ICD-10-CM | POA: Diagnosis not present

## 2018-08-13 DIAGNOSIS — O2692 Pregnancy related conditions, unspecified, second trimester: Secondary | ICD-10-CM

## 2018-08-13 DIAGNOSIS — O359XX0 Maternal care for (suspected) fetal abnormality and damage, unspecified, not applicable or unspecified: Secondary | ICD-10-CM

## 2018-08-13 DIAGNOSIS — O3412 Maternal care for benign tumor of corpus uteri, second trimester: Secondary | ICD-10-CM | POA: Diagnosis not present

## 2018-08-13 DIAGNOSIS — O10012 Pre-existing essential hypertension complicating pregnancy, second trimester: Secondary | ICD-10-CM | POA: Diagnosis not present

## 2018-08-13 DIAGNOSIS — Z148 Genetic carrier of other disease: Secondary | ICD-10-CM

## 2018-08-13 DIAGNOSIS — O09292 Supervision of pregnancy with other poor reproductive or obstetric history, second trimester: Secondary | ICD-10-CM

## 2018-08-13 DIAGNOSIS — D259 Leiomyoma of uterus, unspecified: Secondary | ICD-10-CM

## 2018-08-13 DIAGNOSIS — O09522 Supervision of elderly multigravida, second trimester: Secondary | ICD-10-CM

## 2018-08-13 DIAGNOSIS — O3509X0 Maternal care for (suspected) other central nervous system malformation or damage in fetus, not applicable or unspecified: Secondary | ICD-10-CM

## 2018-08-13 HISTORY — DX: Major depressive disorder, single episode, unspecified: F32.9

## 2018-08-13 HISTORY — DX: Benign neoplasm of connective and other soft tissue, unspecified: D21.9

## 2018-08-13 HISTORY — DX: Anxiety disorder, unspecified: F41.9

## 2018-08-13 HISTORY — DX: Depression, unspecified: F32.A

## 2018-08-13 NOTE — Progress Notes (Signed)
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 Dutton a G3P1 who is here at 51 w 6 days dated by LMP with a due date of 12/25/18. She is being seen at the request of Dr. 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  She has overall done well during the pregnancy. She has the additional pregnancy issues such as chronic hypertension, , fibroids, AMA and history of preeclampsia.   Past Medical History:  Diagnosis Date  . Anxiety   . Depression   . Fibroid   . 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 Migraines   . Preeclampsia 05/28/2013     Past Surgical History:  Procedure Laterality Date  . deviated septum  2004  . NASAL SEPTUM SURGERY     Social History   Socioeconomic History  . Marital status: Married    Spouse name: Not on file  . Number of children: Not on file  . Years of education: Not on file  . Highest education level: Not on file  Occupational History  . Not on file  Social Needs  . Financial resource strain: Not on file  . Food insecurity:    Worry: Not on file    Inability: Not on file  . Transportation needs:    Medical: Not on file    Non-medical: Not on file  Tobacco Use  . Smoking status: Never Smoker  . Smokeless tobacco: Never Used  Substance and Sexual Activity  . Alcohol use: Not Currently    Comment: Occas. pre-preg  . Drug use: No  . Sexual activity: Yes  Lifestyle  . Physical activity:    Days per week: Not on file    Minutes per session: Not on file  . Stress: Not on file  Relationships  . Social connections:    Talks on phone: Not on file    Gets together: Not on file    Attends religious service: Not on file    Active member of club or organization: Not on file    Attends meetings of clubs or organizations: Not on file    Relationship status: Not on file  . Intimate partner violence:     Fear of current or ex partner: Not on file    Emotionally abused: Not on file    Physically abused: Not on file    Forced sexual activity: Not on file  Other Topics Concern  . Not on file  Social History Narrative  . Not on file   OB History    Gravida  3   Para  1   Term  1   Preterm      AB  1   Living  1     SAB  1   TAB      Ectopic      Multiple      Live Births  1          Family History  Problem Relation Age of Onset  . Hypertension Mother   . Hypertension Brother   . Diabetes Maternal Grandmother   . Hypertension Maternal Grandmother   . Mental illness Maternal Grandmother        bipolar?/dementia  . Asthma Sister   . Asthma Sister   . Cancer Paternal Grandmother    Current Outpatient Medications on File Prior to Visit  Medication Sig Dispense Refill  .  famotidine (PEPCID) 20 MG tablet Take 1 tablet (20 mg total) by mouth 2 (two) times daily. (Patient not taking: Reported on 08/13/2018) 60 tablet 1  . Multiple Vitamin (MULTIVITAMIN) tablet Take 1 tablet by mouth daily.    Marland Kitchen NIFEdipine (PROCARDIA-XL/NIFEDICAL-XL) 30 MG 24 hr tablet TAKE ONE TABLET BY MOUTH DAILY 30 tablet 1   No current facility-administered medications on file prior to visit.      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 The differential diagnosis also included vermian dysgenesis, arachnoid cyst and blake pouch cyst.  We discussed that this appears to be an isolated finding as there were no intracranial defects and/or cardiac defects which are most common. However, we note that a genetic causes as well as cardiac should be ruled out as ~50% of DWM can be associated with chromosomal abnormalities.  We discussed that an appropriate evaluation includes an amniocenetesis and fetal echocardiogram.  At this time Ms. Simar was not certain what she wanted to do but would discuss with her husband at home.  (She later called back after the  appointment ad requested to have the cell free drawn. She is aware of the limitations of the cell free DNA as compared to the amniocentesis) however, she wished not to take on additional risk at this time She will meet with our genetic counselor to discuss pricing).  Lastly, we discussed that there is increased risk for neurodevelopmental delay ~50%. In addition we discussed the recurrence risk of 1-5 % for nonsymdromic DWM.  Chronic Hypertension: We were not able to discuss this element but recommend low dose ASA now and maintain BP  <150 mmHg < 105 mmHg.   Ms. Quirarte had no further questions.   I spent 40 minutes in >50% in face to face consultation and care coordination.  Vikki Ports, MD

## 2018-08-21 ENCOUNTER — Other Ambulatory Visit: Payer: Self-pay | Admitting: Physician Assistant

## 2018-08-21 DIAGNOSIS — I10 Essential (primary) hypertension: Secondary | ICD-10-CM

## 2018-08-26 ENCOUNTER — Ambulatory Visit (HOSPITAL_COMMUNITY): Payer: Self-pay | Admitting: Obstetrics and Gynecology

## 2018-08-26 ENCOUNTER — Other Ambulatory Visit (HOSPITAL_COMMUNITY): Payer: BLUE CROSS/BLUE SHIELD

## 2018-08-26 ENCOUNTER — Ambulatory Visit (HOSPITAL_COMMUNITY): Payer: BLUE CROSS/BLUE SHIELD

## 2018-08-26 ENCOUNTER — Ambulatory Visit (HOSPITAL_COMMUNITY): Payer: BLUE CROSS/BLUE SHIELD | Attending: Obstetrics and Gynecology

## 2018-08-26 ENCOUNTER — Other Ambulatory Visit: Payer: Self-pay

## 2018-08-26 DIAGNOSIS — O359XX0 Maternal care for (suspected) fetal abnormality and damage, unspecified, not applicable or unspecified: Secondary | ICD-10-CM | POA: Diagnosis not present

## 2018-08-26 DIAGNOSIS — Z315 Encounter for genetic counseling: Secondary | ICD-10-CM | POA: Diagnosis not present

## 2018-08-26 DIAGNOSIS — O10919 Unspecified pre-existing hypertension complicating pregnancy, unspecified trimester: Secondary | ICD-10-CM

## 2018-08-26 NOTE — Progress Notes (Signed)
Pt completed Genetic counseling with Control and [employer] engineer[employer]

## 2018-08-27 ENCOUNTER — Other Ambulatory Visit (HOSPITAL_COMMUNITY): Payer: Self-pay | Admitting: *Deleted

## 2018-08-27 DIAGNOSIS — O359XX0 Maternal care for (suspected) fetal abnormality and damage, unspecified, not applicable or unspecified: Secondary | ICD-10-CM

## 2018-08-28 DIAGNOSIS — N898 Other specified noninflammatory disorders of vagina: Secondary | ICD-10-CM | POA: Diagnosis not present

## 2018-09-03 ENCOUNTER — Other Ambulatory Visit (HOSPITAL_COMMUNITY): Payer: Self-pay | Admitting: Obstetrics and Gynecology

## 2018-09-03 ENCOUNTER — Encounter (HOSPITAL_COMMUNITY): Payer: Self-pay | Admitting: Maternal & Fetal Medicine

## 2018-09-03 DIAGNOSIS — O350XX0 Maternal care for (suspected) central nervous system malformation in fetus, not applicable or unspecified: Secondary | ICD-10-CM | POA: Diagnosis not present

## 2018-09-03 DIAGNOSIS — Z3A23 23 weeks gestation of pregnancy: Secondary | ICD-10-CM | POA: Diagnosis not present

## 2018-09-04 ENCOUNTER — Telehealth (HOSPITAL_COMMUNITY): Payer: Self-pay | Admitting: *Deleted

## 2018-09-04 NOTE — Telephone Encounter (Signed)
Name and DOB verified.  Called patient with normal results from [place].  Pt voiced understanding, planning to keep upcoming MFM appt.

## 2018-09-10 ENCOUNTER — Ambulatory Visit (HOSPITAL_COMMUNITY): Payer: BLUE CROSS/BLUE SHIELD | Admitting: *Deleted

## 2018-09-10 ENCOUNTER — Other Ambulatory Visit: Payer: Self-pay

## 2018-09-10 ENCOUNTER — Encounter (HOSPITAL_COMMUNITY): Payer: Self-pay | Admitting: *Deleted

## 2018-09-10 ENCOUNTER — Ambulatory Visit (HOSPITAL_COMMUNITY)
Admission: RE | Admit: 2018-09-10 | Discharge: 2018-09-10 | Disposition: A | Discharge status: Home / Self Care | Payer: BLUE CROSS/BLUE SHIELD | Source: Ambulatory Visit | Attending: Obstetrics and Gynecology | Admitting: Obstetrics and Gynecology

## 2018-09-10 VITALS — BP 126/81 | HR 97 | Temp 97.6°F

## 2018-09-10 DIAGNOSIS — D259 Leiomyoma of uterus, unspecified: Secondary | ICD-10-CM

## 2018-09-10 DIAGNOSIS — Z362 Encounter for other antenatal screening follow-up: Secondary | ICD-10-CM

## 2018-09-10 DIAGNOSIS — Q031 Atresia of foramina of Magendie and Luschka: Secondary | ICD-10-CM | POA: Diagnosis not present

## 2018-09-10 DIAGNOSIS — O2692 Pregnancy related conditions, unspecified, second trimester: Secondary | ICD-10-CM

## 2018-09-10 DIAGNOSIS — O359XX0 Maternal care for (suspected) fetal abnormality and damage, unspecified, not applicable or unspecified: Secondary | ICD-10-CM

## 2018-09-10 DIAGNOSIS — O09522 Supervision of elderly multigravida, second trimester: Secondary | ICD-10-CM | POA: Diagnosis not present

## 2018-09-10 DIAGNOSIS — O10919 Unspecified pre-existing hypertension complicating pregnancy, unspecified trimester: Secondary | ICD-10-CM | POA: Insufficient documentation

## 2018-09-10 DIAGNOSIS — O10012 Pre-existing essential hypertension complicating pregnancy, second trimester: Secondary | ICD-10-CM

## 2018-09-10 DIAGNOSIS — O09292 Supervision of pregnancy with other poor reproductive or obstetric history, second trimester: Secondary | ICD-10-CM

## 2018-09-10 DIAGNOSIS — O3412 Maternal care for benign tumor of corpus uteri, second trimester: Secondary | ICD-10-CM

## 2018-09-10 DIAGNOSIS — Z3A24 24 weeks gestation of pregnancy: Secondary | ICD-10-CM

## 2018-09-10 DIAGNOSIS — Z148 Genetic carrier of other disease: Secondary | ICD-10-CM

## 2018-09-11 ENCOUNTER — Other Ambulatory Visit (HOSPITAL_COMMUNITY): Payer: Self-pay | Admitting: *Deleted

## 2018-09-11 DIAGNOSIS — O3509X0 Maternal care for (suspected) other central nervous system malformation or damage in fetus, not applicable or unspecified: Secondary | ICD-10-CM

## 2018-09-11 DIAGNOSIS — O350XX0 Maternal care for (suspected) central nervous system malformation in fetus, not applicable or unspecified: Secondary | ICD-10-CM

## 2018-09-25 ENCOUNTER — Other Ambulatory Visit: Payer: Self-pay | Admitting: Physician Assistant

## 2018-09-25 DIAGNOSIS — I10 Essential (primary) hypertension: Secondary | ICD-10-CM

## 2018-09-30 DIAGNOSIS — Z23 Encounter for immunization: Secondary | ICD-10-CM | POA: Diagnosis not present

## 2018-09-30 DIAGNOSIS — Z3689 Encounter for other specified antenatal screening: Secondary | ICD-10-CM | POA: Diagnosis not present

## 2018-10-04 DIAGNOSIS — Z3689 Encounter for other specified antenatal screening: Secondary | ICD-10-CM | POA: Diagnosis not present

## 2018-10-07 DIAGNOSIS — O99810 Abnormal glucose complicating pregnancy: Secondary | ICD-10-CM | POA: Diagnosis not present

## 2018-10-07 DIAGNOSIS — Z3482 Encounter for supervision of other normal pregnancy, second trimester: Secondary | ICD-10-CM | POA: Diagnosis not present

## 2018-10-08 ENCOUNTER — Ambulatory Visit (HOSPITAL_COMMUNITY)
Admission: RE | Admit: 2018-10-08 | Discharge: 2018-10-08 | Disposition: A | Discharge status: Home / Self Care | Payer: BC Managed Care – PPO | Source: Ambulatory Visit | Attending: Obstetrics and Gynecology | Admitting: Obstetrics and Gynecology

## 2018-10-08 ENCOUNTER — Ambulatory Visit (HOSPITAL_COMMUNITY): Payer: BC Managed Care – PPO | Admitting: *Deleted

## 2018-10-08 ENCOUNTER — Other Ambulatory Visit: Payer: Self-pay

## 2018-10-08 ENCOUNTER — Encounter (HOSPITAL_COMMUNITY): Payer: Self-pay | Admitting: *Deleted

## 2018-10-08 VITALS — BP 126/77 | HR 115 | Temp 98.6°F

## 2018-10-08 DIAGNOSIS — O350XX0 Maternal care for (suspected) central nervous system malformation in fetus, not applicable or unspecified: Secondary | ICD-10-CM | POA: Insufficient documentation

## 2018-10-08 DIAGNOSIS — O3413 Maternal care for benign tumor of corpus uteri, third trimester: Secondary | ICD-10-CM

## 2018-10-08 DIAGNOSIS — O09523 Supervision of elderly multigravida, third trimester: Secondary | ICD-10-CM

## 2018-10-08 DIAGNOSIS — O3509X0 Maternal care for (suspected) other central nervous system malformation or damage in fetus, not applicable or unspecified: Secondary | ICD-10-CM

## 2018-10-08 DIAGNOSIS — I10 Essential (primary) hypertension: Secondary | ICD-10-CM | POA: Insufficient documentation

## 2018-10-08 DIAGNOSIS — Z3A28 28 weeks gestation of pregnancy: Secondary | ICD-10-CM

## 2018-10-08 DIAGNOSIS — O2693 Pregnancy related conditions, unspecified, third trimester: Secondary | ICD-10-CM

## 2018-10-08 DIAGNOSIS — O09293 Supervision of pregnancy with other poor reproductive or obstetric history, third trimester: Secondary | ICD-10-CM

## 2018-10-08 DIAGNOSIS — O99213 Obesity complicating pregnancy, third trimester: Secondary | ICD-10-CM

## 2018-10-08 DIAGNOSIS — D259 Leiomyoma of uterus, unspecified: Secondary | ICD-10-CM

## 2018-10-08 DIAGNOSIS — Z148 Genetic carrier of other disease: Secondary | ICD-10-CM

## 2018-10-08 DIAGNOSIS — O10013 Pre-existing essential hypertension complicating pregnancy, third trimester: Secondary | ICD-10-CM

## 2018-10-08 DIAGNOSIS — O359XX0 Maternal care for (suspected) fetal abnormality and damage, unspecified, not applicable or unspecified: Secondary | ICD-10-CM

## 2018-10-08 DIAGNOSIS — Z362 Encounter for other antenatal screening follow-up: Secondary | ICD-10-CM

## 2018-10-09 ENCOUNTER — Other Ambulatory Visit (HOSPITAL_COMMUNITY): Payer: Self-pay | Admitting: *Deleted

## 2018-10-09 DIAGNOSIS — O10919 Unspecified pre-existing hypertension complicating pregnancy, unspecified trimester: Secondary | ICD-10-CM

## 2018-11-06 ENCOUNTER — Ambulatory Visit (HOSPITAL_COMMUNITY)
Admission: RE | Admit: 2018-11-06 | Discharge: 2018-11-06 | Disposition: A | Discharge status: Home / Self Care | Payer: BC Managed Care – PPO | Source: Ambulatory Visit | Attending: Obstetrics and Gynecology | Admitting: Obstetrics and Gynecology

## 2018-11-06 ENCOUNTER — Ambulatory Visit (HOSPITAL_COMMUNITY): Payer: BC Managed Care – PPO | Admitting: *Deleted

## 2018-11-06 ENCOUNTER — Other Ambulatory Visit: Payer: Self-pay

## 2018-11-06 ENCOUNTER — Encounter (HOSPITAL_COMMUNITY): Payer: Self-pay

## 2018-11-06 VITALS — BP 129/77 | HR 101 | Temp 98.0°F

## 2018-11-06 DIAGNOSIS — O99213 Obesity complicating pregnancy, third trimester: Secondary | ICD-10-CM

## 2018-11-06 DIAGNOSIS — O10919 Unspecified pre-existing hypertension complicating pregnancy, unspecified trimester: Secondary | ICD-10-CM | POA: Insufficient documentation

## 2018-11-06 DIAGNOSIS — O3413 Maternal care for benign tumor of corpus uteri, third trimester: Secondary | ICD-10-CM

## 2018-11-06 DIAGNOSIS — Z3A33 33 weeks gestation of pregnancy: Secondary | ICD-10-CM

## 2018-11-06 DIAGNOSIS — Z362 Encounter for other antenatal screening follow-up: Secondary | ICD-10-CM

## 2018-11-06 DIAGNOSIS — Z148 Genetic carrier of other disease: Secondary | ICD-10-CM

## 2018-11-06 DIAGNOSIS — O09523 Supervision of elderly multigravida, third trimester: Secondary | ICD-10-CM | POA: Diagnosis not present

## 2018-11-06 DIAGNOSIS — D259 Leiomyoma of uterus, unspecified: Secondary | ICD-10-CM

## 2018-11-06 DIAGNOSIS — O10013 Pre-existing essential hypertension complicating pregnancy, third trimester: Secondary | ICD-10-CM

## 2018-11-06 DIAGNOSIS — O09293 Supervision of pregnancy with other poor reproductive or obstetric history, third trimester: Secondary | ICD-10-CM

## 2018-11-06 DIAGNOSIS — O2693 Pregnancy related conditions, unspecified, third trimester: Secondary | ICD-10-CM

## 2018-11-07 ENCOUNTER — Other Ambulatory Visit (HOSPITAL_COMMUNITY): Payer: Self-pay | Admitting: *Deleted

## 2018-11-07 DIAGNOSIS — O359XX0 Maternal care for (suspected) fetal abnormality and damage, unspecified, not applicable or unspecified: Secondary | ICD-10-CM

## 2018-11-15 DIAGNOSIS — O139 Gestational [pregnancy-induced] hypertension without significant proteinuria, unspecified trimester: Secondary | ICD-10-CM | POA: Diagnosis not present

## 2018-11-15 DIAGNOSIS — O133 Gestational [pregnancy-induced] hypertension without significant proteinuria, third trimester: Secondary | ICD-10-CM | POA: Diagnosis not present

## 2018-11-15 DIAGNOSIS — Z3A34 34 weeks gestation of pregnancy: Secondary | ICD-10-CM | POA: Diagnosis not present

## 2018-11-18 DIAGNOSIS — O133 Gestational [pregnancy-induced] hypertension without significant proteinuria, third trimester: Secondary | ICD-10-CM | POA: Diagnosis not present

## 2018-11-18 DIAGNOSIS — Z3A34 34 weeks gestation of pregnancy: Secondary | ICD-10-CM | POA: Diagnosis not present

## 2018-11-21 DIAGNOSIS — O133 Gestational [pregnancy-induced] hypertension without significant proteinuria, third trimester: Secondary | ICD-10-CM | POA: Diagnosis not present

## 2018-11-21 DIAGNOSIS — I10 Essential (primary) hypertension: Secondary | ICD-10-CM | POA: Diagnosis not present

## 2018-11-21 DIAGNOSIS — Z3A35 35 weeks gestation of pregnancy: Secondary | ICD-10-CM | POA: Diagnosis not present

## 2018-11-25 DIAGNOSIS — Z3A35 35 weeks gestation of pregnancy: Secondary | ICD-10-CM | POA: Diagnosis not present

## 2018-11-25 DIAGNOSIS — O133 Gestational [pregnancy-induced] hypertension without significant proteinuria, third trimester: Secondary | ICD-10-CM | POA: Diagnosis not present

## 2018-11-28 DIAGNOSIS — O139 Gestational [pregnancy-induced] hypertension without significant proteinuria, unspecified trimester: Secondary | ICD-10-CM | POA: Diagnosis not present

## 2018-11-28 DIAGNOSIS — Z3A36 36 weeks gestation of pregnancy: Secondary | ICD-10-CM | POA: Diagnosis not present

## 2018-12-02 DIAGNOSIS — Z3685 Encounter for antenatal screening for Streptococcus B: Secondary | ICD-10-CM | POA: Diagnosis not present

## 2018-12-02 DIAGNOSIS — O133 Gestational [pregnancy-induced] hypertension without significant proteinuria, third trimester: Secondary | ICD-10-CM | POA: Diagnosis not present

## 2018-12-02 DIAGNOSIS — Z3A36 36 weeks gestation of pregnancy: Secondary | ICD-10-CM | POA: Diagnosis not present

## 2018-12-03 ENCOUNTER — Encounter (HOSPITAL_COMMUNITY): Payer: Self-pay | Admitting: *Deleted

## 2018-12-03 ENCOUNTER — Telehealth (HOSPITAL_COMMUNITY): Payer: Self-pay | Admitting: *Deleted

## 2018-12-03 NOTE — Telephone Encounter (Signed)
Preadmission screen  

## 2018-12-04 ENCOUNTER — Ambulatory Visit (HOSPITAL_COMMUNITY): Payer: BC Managed Care – PPO | Admitting: *Deleted

## 2018-12-04 ENCOUNTER — Other Ambulatory Visit (HOSPITAL_COMMUNITY): Payer: Self-pay | Admitting: Maternal & Fetal Medicine

## 2018-12-04 ENCOUNTER — Other Ambulatory Visit: Payer: Self-pay

## 2018-12-04 ENCOUNTER — Ambulatory Visit (HOSPITAL_COMMUNITY)
Admission: RE | Admit: 2018-12-04 | Discharge: 2018-12-04 | Disposition: A | Discharge status: Home / Self Care | Payer: BC Managed Care – PPO | Source: Ambulatory Visit | Attending: Maternal & Fetal Medicine | Admitting: Maternal & Fetal Medicine

## 2018-12-04 ENCOUNTER — Other Ambulatory Visit (HOSPITAL_COMMUNITY)
Admission: RE | Admit: 2018-12-04 | Discharge: 2018-12-04 | Disposition: A | Discharge status: Home / Self Care | Payer: BC Managed Care – PPO | Source: Ambulatory Visit | Attending: Obstetrics and Gynecology | Admitting: Obstetrics and Gynecology

## 2018-12-04 ENCOUNTER — Encounter (HOSPITAL_COMMUNITY): Payer: Self-pay | Admitting: *Deleted

## 2018-12-04 VITALS — BP 148/97 | HR 82 | Temp 98.7°F

## 2018-12-04 DIAGNOSIS — I10 Essential (primary) hypertension: Secondary | ICD-10-CM

## 2018-12-04 DIAGNOSIS — O3413 Maternal care for benign tumor of corpus uteri, third trimester: Secondary | ICD-10-CM

## 2018-12-04 DIAGNOSIS — O10013 Pre-existing essential hypertension complicating pregnancy, third trimester: Secondary | ICD-10-CM

## 2018-12-04 DIAGNOSIS — Z362 Encounter for other antenatal screening follow-up: Secondary | ICD-10-CM | POA: Diagnosis not present

## 2018-12-04 DIAGNOSIS — D259 Leiomyoma of uterus, unspecified: Secondary | ICD-10-CM

## 2018-12-04 DIAGNOSIS — Z20828 Contact with and (suspected) exposure to other viral communicable diseases: Secondary | ICD-10-CM | POA: Diagnosis not present

## 2018-12-04 DIAGNOSIS — O99213 Obesity complicating pregnancy, third trimester: Secondary | ICD-10-CM

## 2018-12-04 DIAGNOSIS — O359XX0 Maternal care for (suspected) fetal abnormality and damage, unspecified, not applicable or unspecified: Secondary | ICD-10-CM

## 2018-12-04 DIAGNOSIS — O2693 Pregnancy related conditions, unspecified, third trimester: Secondary | ICD-10-CM

## 2018-12-04 DIAGNOSIS — O09293 Supervision of pregnancy with other poor reproductive or obstetric history, third trimester: Secondary | ICD-10-CM

## 2018-12-04 DIAGNOSIS — Z148 Genetic carrier of other disease: Secondary | ICD-10-CM

## 2018-12-04 DIAGNOSIS — O09523 Supervision of elderly multigravida, third trimester: Secondary | ICD-10-CM

## 2018-12-04 DIAGNOSIS — Z3A37 37 weeks gestation of pregnancy: Secondary | ICD-10-CM

## 2018-12-04 LAB — SARS CORONAVIRUS 2 (TAT 6-24 HRS): SARS Coronavirus 2: NEGATIVE

## 2018-12-04 NOTE — MAU Note (Signed)
Covid swab collected.Pt tolerated well PT asymptomatic

## 2018-12-06 ENCOUNTER — Inpatient Hospital Stay (HOSPITAL_COMMUNITY): Payer: BC Managed Care – PPO | Admitting: Anesthesiology

## 2018-12-06 ENCOUNTER — Encounter (HOSPITAL_COMMUNITY): Payer: Self-pay | Admitting: *Deleted

## 2018-12-06 ENCOUNTER — Encounter (HOSPITAL_COMMUNITY)
Admission: AD | Disposition: A | Discharge status: Home / Self Care | Payer: Self-pay | Source: Home / Self Care | Attending: Obstetrics and Gynecology

## 2018-12-06 ENCOUNTER — Inpatient Hospital Stay (HOSPITAL_COMMUNITY): Payer: BC Managed Care – PPO

## 2018-12-06 ENCOUNTER — Inpatient Hospital Stay (HOSPITAL_COMMUNITY)
Admission: AD | Admit: 2018-12-06 | Discharge: 2018-12-10 | DRG: 787 | Disposition: A | Discharge status: Home / Self Care | Payer: BC Managed Care – PPO | Attending: Obstetrics and Gynecology | Admitting: Obstetrics and Gynecology

## 2018-12-06 DIAGNOSIS — Q031 Atresia of foramina of Magendie and Luschka: Secondary | ICD-10-CM | POA: Diagnosis not present

## 2018-12-06 DIAGNOSIS — I10 Essential (primary) hypertension: Secondary | ICD-10-CM

## 2018-12-06 DIAGNOSIS — O99214 Obesity complicating childbirth: Secondary | ICD-10-CM | POA: Diagnosis not present

## 2018-12-06 DIAGNOSIS — D62 Acute posthemorrhagic anemia: Secondary | ICD-10-CM | POA: Diagnosis not present

## 2018-12-06 DIAGNOSIS — O134 Gestational [pregnancy-induced] hypertension without significant proteinuria, complicating childbirth: Principal | ICD-10-CM | POA: Diagnosis present

## 2018-12-06 DIAGNOSIS — Z23 Encounter for immunization: Secondary | ICD-10-CM | POA: Diagnosis not present

## 2018-12-06 DIAGNOSIS — Z98891 History of uterine scar from previous surgery: Secondary | ICD-10-CM

## 2018-12-06 DIAGNOSIS — O1494 Unspecified pre-eclampsia, complicating childbirth: Secondary | ICD-10-CM | POA: Diagnosis not present

## 2018-12-06 DIAGNOSIS — O3413 Maternal care for benign tumor of corpus uteri, third trimester: Secondary | ICD-10-CM | POA: Diagnosis not present

## 2018-12-06 DIAGNOSIS — Z3A36 36 weeks gestation of pregnancy: Secondary | ICD-10-CM | POA: Diagnosis not present

## 2018-12-06 DIAGNOSIS — Z3A37 37 weeks gestation of pregnancy: Secondary | ICD-10-CM | POA: Diagnosis not present

## 2018-12-06 DIAGNOSIS — Q043 Other reduction deformities of brain: Secondary | ICD-10-CM | POA: Diagnosis not present

## 2018-12-06 DIAGNOSIS — D252 Subserosal leiomyoma of uterus: Secondary | ICD-10-CM | POA: Diagnosis not present

## 2018-12-06 DIAGNOSIS — K4090 Unilateral inguinal hernia, without obstruction or gangrene, not specified as recurrent: Secondary | ICD-10-CM | POA: Diagnosis not present

## 2018-12-06 DIAGNOSIS — O9081 Anemia of the puerperium: Secondary | ICD-10-CM | POA: Diagnosis not present

## 2018-12-06 DIAGNOSIS — N5089 Other specified disorders of the male genital organs: Secondary | ICD-10-CM | POA: Diagnosis not present

## 2018-12-06 LAB — CBC
HCT: 34.9 % — ABNORMAL LOW (ref 36.0–46.0)
HCT: 38.1 % (ref 36.0–46.0)
Hemoglobin: 12.3 g/dL (ref 12.0–15.0)
Hemoglobin: 13.5 g/dL (ref 12.0–15.0)
MCH: 32.1 pg (ref 26.0–34.0)
MCH: 32.5 pg (ref 26.0–34.0)
MCHC: 35.2 g/dL (ref 30.0–36.0)
MCHC: 35.4 g/dL (ref 30.0–36.0)
MCV: 91.1 fL (ref 80.0–100.0)
MCV: 91.8 fL (ref 80.0–100.0)
Platelets: 144 10*3/uL — ABNORMAL LOW (ref 150–400)
Platelets: 148 10*3/uL — ABNORMAL LOW (ref 150–400)
RBC: 3.83 MIL/uL — ABNORMAL LOW (ref 3.87–5.11)
RBC: 4.15 MIL/uL (ref 3.87–5.11)
RDW: 13.5 % (ref 11.5–15.5)
RDW: 13.4 % (ref 11.5–15.5)
WBC: 9.9 10*3/uL (ref 4.0–10.5)
WBC: 12.8 10*3/uL — ABNORMAL HIGH (ref 4.0–10.5)
nRBC: 0 % (ref 0.0–0.2)
nRBC: 0 % (ref 0.0–0.2)

## 2018-12-06 LAB — ABO/RH: ABO/RH(D): A POS

## 2018-12-06 LAB — TYPE AND SCREEN
ABO/RH(D): A POS
Antibody Screen: NEGATIVE

## 2018-12-06 LAB — COMPREHENSIVE METABOLIC PANEL
ALT: 19 U/L (ref 0–44)
AST: 25 U/L (ref 15–41)
Albumin: 2.6 g/dL — ABNORMAL LOW (ref 3.5–5.0)
Alkaline Phosphatase: 81 U/L (ref 38–126)
Anion gap: 10 (ref 5–15)
BUN: 6 mg/dL (ref 6–20)
CO2: 19 mmol/L — ABNORMAL LOW (ref 22–32)
Calcium: 8.8 mg/dL — ABNORMAL LOW (ref 8.9–10.3)
Chloride: 109 mmol/L (ref 98–111)
Creatinine, Ser: 0.8 mg/dL (ref 0.44–1.00)
GFR calc Af Amer: >60 mL/min (ref >60)
GFR calc non Af Amer: >60 mL/min (ref >60)
Glucose, Bld: 112 mg/dL — ABNORMAL HIGH (ref 70–99)
Potassium: 3.4 mmol/L — ABNORMAL LOW (ref 3.5–5.1)
Sodium: 138 mmol/L (ref 135–145)
Total Bilirubin: 0.4 mg/dL (ref 0.3–1.2)
Total Protein: 5.6 g/dL — ABNORMAL LOW (ref 6.5–8.1)

## 2018-12-06 SURGERY — Surgical Case
Anesthesia: Epidural

## 2018-12-06 MED ORDER — FLEET ENEMA 7-19 GM/118ML RE ENEM: 1.0000 | ENEMA | RECTAL | Status: DC | PRN

## 2018-12-06 MED ORDER — PHENYLEPHRINE 40 MCG/ML (10ML) SYRINGE FOR IV PUSH (FOR BLOOD PRESSURE SUPPORT): 80.0000 ug | PREFILLED_SYRINGE | INTRAVENOUS | Status: DC | PRN

## 2018-12-06 MED ORDER — TERBUTALINE SULFATE 1 MG/ML IJ SOLN
0.2500 mg | INTRAMUSCULAR | Status: AC | PRN
  Administered 2018-12-06 (×2): 0.25 mg via SUBCUTANEOUS

## 2018-12-06 MED ORDER — SODIUM CHLORIDE (PF) 0.9 % IJ SOLN
INTRAMUSCULAR | Status: DC | PRN
  Administered 2018-12-06: 12 mL/h via EPIDURAL

## 2018-12-06 MED ORDER — DIPHENHYDRAMINE HCL 50 MG/ML IJ SOLN: 12.5000 mg | INTRAMUSCULAR | Status: DC | PRN

## 2018-12-06 MED ORDER — ONDANSETRON HCL 4 MG/2ML IJ SOLN
INTRAMUSCULAR | Status: AC
  Filled 2018-12-06: qty 2

## 2018-12-06 MED ORDER — TERBUTALINE SULFATE 1 MG/ML IJ SOLN
0.2500 mg | Freq: Once | INTRAMUSCULAR | Status: AC
  Administered 2018-12-06: 0.25 mg via SUBCUTANEOUS

## 2018-12-06 MED ORDER — LACTATED RINGERS IV SOLN
INTRAVENOUS | Status: DC | PRN
  Administered 2018-12-06 – 2018-12-07 (×3): via INTRAVENOUS

## 2018-12-06 MED ORDER — BUTORPHANOL TARTRATE 1 MG/ML IJ SOLN
INTRAMUSCULAR | Status: AC
  Filled 2018-12-06: qty 1

## 2018-12-06 MED ORDER — PHENYLEPHRINE HCL (PRESSORS) 10 MG/ML IV SOLN
INTRAVENOUS | Status: DC | PRN
  Administered 2018-12-06 – 2018-12-07 (×3): 40 ug via INTRAVENOUS
  Administered 2018-12-07: 80 ug via INTRAVENOUS
  Administered 2018-12-07: 40 ug via INTRAVENOUS
  Administered 2018-12-07 (×2): 80 ug via INTRAVENOUS

## 2018-12-06 MED ORDER — MISOPROSTOL 25 MCG QUARTER TABLET
ORAL_TABLET | ORAL | Status: AC
  Filled 2018-12-06: qty 1

## 2018-12-06 MED ORDER — LACTATED RINGERS IV SOLN
500.0000 mL | INTRAVENOUS | Status: DC | PRN
  Administered 2018-12-06 (×2): 500 mL via INTRAVENOUS

## 2018-12-06 MED ORDER — EPHEDRINE 5 MG/ML INJ: 10.0000 mg | INTRAVENOUS | Status: DC | PRN

## 2018-12-06 MED ORDER — FENTANYL CITRATE (PF) 100 MCG/2ML IJ SOLN
100.0000 ug | Freq: Once | INTRAMUSCULAR | Status: AC
  Administered 2018-12-06: 100 ug via INTRAVENOUS

## 2018-12-06 MED ORDER — LACTATED RINGERS IV SOLN
500.0000 mL | Freq: Once | INTRAVENOUS | Status: AC
  Administered 2018-12-06: 500 mL via INTRAVENOUS

## 2018-12-06 MED ORDER — FENTANYL CITRATE (PF) 100 MCG/2ML IJ SOLN
INTRAMUSCULAR | Status: AC
  Filled 2018-12-06: qty 2

## 2018-12-06 MED ORDER — DEXAMETHASONE SODIUM PHOSPHATE 10 MG/ML IJ SOLN
INTRAMUSCULAR | Status: AC
  Filled 2018-12-06: qty 1

## 2018-12-06 MED ORDER — FENTANYL-BUPIVACAINE-NACL 0.5-0.125-0.9 MG/250ML-% EP SOLN
12.0000 mL/h | EPIDURAL | Status: DC | PRN
  Filled 2018-12-06: qty 250

## 2018-12-06 MED ORDER — BUTORPHANOL TARTRATE 1 MG/ML IJ SOLN
1.0000 mg | Freq: Once | INTRAMUSCULAR | Status: AC
  Administered 2018-12-06: 1 mg via INTRAVENOUS

## 2018-12-06 MED ORDER — TERBUTALINE SULFATE 1 MG/ML IJ SOLN
INTRAMUSCULAR | Status: AC
  Administered 2018-12-06: 0.25 mg via SUBCUTANEOUS
  Filled 2018-12-06: qty 1

## 2018-12-06 MED ORDER — OXYCODONE-ACETAMINOPHEN 5-325 MG PO TABS: 1.0000 | ORAL_TABLET | ORAL | Status: DC | PRN

## 2018-12-06 MED ORDER — SODIUM CHLORIDE 0.9 % IV SOLN
INTRAVENOUS | Status: DC | PRN
  Administered 2018-12-06: 40 [IU] via INTRAVENOUS

## 2018-12-06 MED ORDER — LIDOCAINE HCL (PF) 1 % IJ SOLN: 30.0000 mL | INTRAMUSCULAR | Status: DC | PRN

## 2018-12-06 MED ORDER — ONDANSETRON HCL 4 MG/2ML IJ SOLN: 4.0000 mg | Freq: Four times a day (QID) | INTRAMUSCULAR | Status: DC | PRN

## 2018-12-06 MED ORDER — SODIUM BICARBONATE 8.4 % IV SOLN
INTRAVENOUS | Status: DC | PRN
  Administered 2018-12-06 (×3): 5 mL via EPIDURAL

## 2018-12-06 MED ORDER — OXYTOCIN BOLUS FROM INFUSION: 500.0000 mL | Freq: Once | INTRAVENOUS | Status: DC

## 2018-12-06 MED ORDER — ACETAMINOPHEN 325 MG PO TABS: 650.0000 mg | ORAL_TABLET | ORAL | Status: DC | PRN

## 2018-12-06 MED ORDER — MORPHINE SULFATE (PF) 0.5 MG/ML IJ SOLN
INTRAMUSCULAR | Status: AC
  Filled 2018-12-06: qty 10

## 2018-12-06 MED ORDER — SOD CITRATE-CITRIC ACID 500-334 MG/5ML PO SOLN
30.0000 mL | ORAL | Status: DC | PRN
  Administered 2018-12-06: 30 mL via ORAL
  Filled 2018-12-06: qty 30

## 2018-12-06 MED ORDER — OXYCODONE-ACETAMINOPHEN 5-325 MG PO TABS: 2.0000 | ORAL_TABLET | ORAL | Status: DC | PRN

## 2018-12-06 MED ORDER — SODIUM CHLORIDE 0.9 % IV SOLN
INTRAVENOUS | Status: DC | PRN
  Administered 2018-12-06: via INTRAVENOUS

## 2018-12-06 MED ORDER — MISOPROSTOL 25 MCG QUARTER TABLET
25.0000 ug | ORAL_TABLET | ORAL | Status: DC
  Administered 2018-12-06 (×2): 25 ug via VAGINAL
  Filled 2018-12-06: qty 1

## 2018-12-06 MED ORDER — OXYTOCIN 40 UNITS IN NORMAL SALINE INFUSION - SIMPLE MED
2.5000 [IU]/h | INTRAVENOUS | Status: DC
  Filled 2018-12-06: qty 1000

## 2018-12-06 MED ORDER — LACTATED RINGERS IV SOLN
INTRAVENOUS | Status: DC
  Administered 2018-12-06 (×2): 1000 mL via INTRAVENOUS

## 2018-12-06 MED ORDER — LIDOCAINE HCL (PF) 1 % IJ SOLN
INTRAMUSCULAR | Status: DC | PRN
  Administered 2018-12-06 (×2): 4 mL via EPIDURAL

## 2018-12-06 SURGICAL SUPPLY — 43 items
APL SKNCLS STERI-STRIP NONHPOA (GAUZE/BANDAGES/DRESSINGS) ×1
BENZOIN TINCTURE PRP APPL 2/3 (GAUZE/BANDAGES/DRESSINGS) ×1 IMPLANT
CHLORAPREP W/TINT 26ML (MISCELLANEOUS) ×2 IMPLANT
CLAMP CORD UMBIL (MISCELLANEOUS) IMPLANT
CLOTH BEACON ORANGE TIMEOUT ST (SAFETY) ×2 IMPLANT
CLSR STERI-STRIP ANTIMIC 1/2X4 (GAUZE/BANDAGES/DRESSINGS) ×1 IMPLANT
DRAPE C SECTION CLR SCREEN (DRAPES) ×2 IMPLANT
DRSG OPSITE POSTOP 4X10 (GAUZE/BANDAGES/DRESSINGS) ×2 IMPLANT
ELECT REM PT RETURN 9FT ADLT (ELECTROSURGICAL) ×2
ELECTRODE REM PT RTRN 9FT ADLT (ELECTROSURGICAL) ×1 IMPLANT
EXTRACTOR VACUUM KIWI (MISCELLANEOUS) IMPLANT
GLOVE BIO SURGEON STRL SZ 6.5 (GLOVE) ×2 IMPLANT
GLOVE BIOGEL PI IND STRL 7.0 (GLOVE) ×2 IMPLANT
GLOVE BIOGEL PI INDICATOR 7.0 (GLOVE) ×2
GOWN STRL REUS W/TWL LRG LVL3 (GOWN DISPOSABLE) ×4 IMPLANT
KIT ABG SYR 3ML LUER SLIP (SYRINGE) IMPLANT
NDL HYPO 25X5/8 SAFETYGLIDE (NEEDLE) IMPLANT
NEEDLE HYPO 25X5/8 SAFETYGLIDE (NEEDLE) IMPLANT
NS IRRIG 1000ML POUR BTL (IV SOLUTION) ×2 IMPLANT
PACK C SECTION WH (CUSTOM PROCEDURE TRAY) ×2 IMPLANT
PAD ABD 8X10 STRL (GAUZE/BANDAGES/DRESSINGS) ×1 IMPLANT
PAD OB MATERNITY 4.3X12.25 (PERSONAL CARE ITEMS) ×2 IMPLANT
RETRACTOR TRAXI PANNICULUS (MISCELLANEOUS) IMPLANT
RETRACTOR WND ALEXIS 25 LRG (MISCELLANEOUS) ×1 IMPLANT
RTRCTR C-SECT PINK 25CM LRG (MISCELLANEOUS) IMPLANT
RTRCTR WOUND ALEXIS 25CM LRG (MISCELLANEOUS) ×2
SPONGE GAUZE 4X4 12PLY STER LF (GAUZE/BANDAGES/DRESSINGS) ×1 IMPLANT
STRIP CLOSURE SKIN 1/2X4 (GAUZE/BANDAGES/DRESSINGS) IMPLANT
SUT CHROMIC 1 CTX 36 (SUTURE) ×4 IMPLANT
SUT PLAIN 0 NONE (SUTURE) IMPLANT
SUT PLAIN 2 0 XLH (SUTURE) ×2 IMPLANT
SUT VIC AB 0 CT1 27 (SUTURE) ×4
SUT VIC AB 0 CT1 27XBRD ANBCTR (SUTURE) ×2 IMPLANT
SUT VIC AB 2-0 CT1 27 (SUTURE) ×2
SUT VIC AB 2-0 CT1 TAPERPNT 27 (SUTURE) ×1 IMPLANT
SUT VIC AB 3-0 CT1 27 (SUTURE)
SUT VIC AB 3-0 CT1 TAPERPNT 27 (SUTURE) IMPLANT
SUT VIC AB 4-0 KS 27 (SUTURE) ×2 IMPLANT
TAPE CLOTH SURG 6X10 WHT LF (GAUZE/BANDAGES/DRESSINGS) ×1 IMPLANT
TOWEL OR 17X24 6PK STRL BLUE (TOWEL DISPOSABLE) ×2 IMPLANT
TRAXI PANNICULUS RETRACTOR (MISCELLANEOUS) ×1
TRAY FOLEY W/BAG SLVR 14FR LF (SET/KITS/TRAYS/PACK) ×2 IMPLANT
WATER STERILE IRR 1000ML POUR (IV SOLUTION) ×2 IMPLANT

## 2018-12-06 NOTE — Progress Notes (Signed)
Patient ID: [name], female   DOB: Nov 02, 1980, 38 y.o.   MRN: [id number] Pt reports severe pain with contractions not relieved with fentanyl. Pain is in her back and pelvis. +FMs, no LOF VS - 145/90, 78 EFM - 130, cat 1 TOCO - contractions q 2-40mins SVE - 6/90/-2  A/P: G3P1011 at 37 2/7wks with preE progressing into active labor after two doses of cytotec          BP stable          Epidural now then will perform AROM          Anticipate svd

## 2018-12-06 NOTE — Progress Notes (Signed)
Patient ID: Kimberly Howard, female   DOB: [birth year], 38 y.o.   MRN: [id number] After pt received epidural, sve noted to be 9.5cm dilated.  Pt continued to have contractions q 2 mins and reported minimal relief despite epidural.  FHT was noted to decelerate into the 60s . Position changes were employed with resolution finally with pt in hands and knees position. Pt remained in this position for a while. She begun to involuntarily push contractions. After pushing for a while, pt was turned back to her side then her back and continued pushing. Recurrent decelerations were once again noted with contractions q 72mins. Terbutaline x 2 was given and once contractions spaced out, pt was able to push with no further decelerations.  Station remained at 0 after pushing for over an hour. Maternal fatigue was noted per diminished effort with pushing. Pt continued to report pain. Anesthesia was notified and epidural bolused.  Pt appeared to have relief at this point and was able to take a nap.   Pt and husband were counseled at several points that if cat 2 strip was persistent and/or category 3 strip noted, would need to proceed to delivery via cesarean section.

## 2018-12-06 NOTE — H&P (Signed)
Kimberly Howard is a 38 y.o.G66P1011  female presenting at 39 2/7wks  for iol due to preE. Pt had rising BPs over the past few weeks despite procardia xl daily. She reported intermittent HAs and had proteinuria. She had a history of preE with last pregnancy. Was on baby asa through this pregnancy.  She is also AMA Pt is dated per LMP which was confirmed with a 10week [place]. She screened positive for sma; FOB declined screening. 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 GBS is negative OB History    Gravida  3   Para  1   Term  1   Preterm      AB  1   Living  1     SAB  1   TAB      Ectopic      Multiple      Live Births  1          Past Medical History:  Diagnosis Date  . Anxiety   . Depression   . Fibroid   . History of pre-eclampsia   . 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 Migraines   . Preeclampsia 05/28/2013   Past Surgical History:  Procedure Laterality Date  . deviated septum  2004  . NASAL SEPTUM SURGERY     Family History: family history includes Asthma in her sister and sister; Cancer in her paternal grandmother; Diabetes in her maternal grandmother; Heart murmur in her son; Hypertension in her brother, maternal grandmother, and mother; Mental illness in her maternal grandmother. Social History:  reports that she has never smoked. She has never used smokeless tobacco. She reports previous alcohol use. She reports that she does not use drugs.     Maternal Diabetes: No Genetic Screening: Abnormal:  Results: Other:see HPI Maternal Ultrasounds/Referrals: Other:see HPI Fetal Ultrasounds or other Referrals:  Referred to Materal Fetal Medicine , Other: See HPI Maternal Substance Abuse:  No Significant Maternal Medications:  Meds include: Other: procardia 60xl Significant Maternal Lab Results:  Group B Strep negative Other Comments:  None  Review of Systems  Constitutional: Positive for  malaise/fatigue. Negative for chills, fever and weight loss.  Eyes: Negative for blurred vision and double vision.  Respiratory: Negative for shortness of breath.   Cardiovascular: Negative for chest pain.  Gastrointestinal: Positive for abdominal pain. Negative for heartburn, nausea and vomiting.  Genitourinary: Negative for dysuria.  Musculoskeletal: Positive for myalgias.  Skin: Negative for itching and rash.  Neurological: Positive for headaches. Negative for dizziness.  Endo/Heme/Allergies: Does not bruise/bleed easily.  Psychiatric/Behavioral: Negative for depression, hallucinations, substance abuse and suicidal ideas. The patient is nervous/anxious.    Maternal Medical History:  Reason for admission: Nausea. preE  Contractions: Frequency: irregular and rare.   Perceived severity is mild.    Fetal activity: Perceived fetal activity is normal.    Prenatal complications: Pre-eclampsia.   Prenatal Complications - Diabetes: none.    Dilation: 10 Effacement (%): Thick Station: 0 Exam by:: banga, md Blood pressure (!) 76/34, pulse 89, temperature 98.7 F (37.1 C), resp. rate 18, height 5\' 4"  (1.626 m), last menstrual period 03/20/2018, SpO2 99 %. Maternal Exam:  Uterine Assessment: Contraction strength is mild.  Contraction frequency is rare.   Abdomen: Patient reports generalized tenderness.  Estimated fetal weight is AGA.   Fetal presentation: vertex  Introitus: Normal vulva. Normal vagina.  Pelvis: adequate for delivery.   Cervix: Cervix evaluated  by digital exam.     Fetal Exam Fetal Monitor Review: Baseline rate: 150.  Variability: moderate (6-25 bpm).   Pattern: accelerations present and no decelerations.    Fetal State Assessment: Category I - tracings are normal.     Physical Exam  GI: There is generalized abdominal tenderness.  Genitourinary:    Vulva normal.     Prenatal labs: ABO, Rh: --/--/A POS, A POS Performed at [facility], [place] [address] [place], [place] [address]  [id number]) Antibody: NEG (08/14 0800) Rubella: Immune (02/05 0000) RPR: Nonreactive (02/05 0000)  HBsAg: Negative (02/05 0000)  HIV: Non-reactive (02/05 0000)  GBS:     Assessment/Plan: 38yo G 3P1011 at 43 2/7wks with preE  Admit for iol; cytotec then pit/AROM if indicated Pain control prn GBS negative PreE labs; treat BP prn  Anticipate svd  Cecilia W Banga 12/06/2018, 11:35 PM

## 2018-12-06 NOTE — Anesthesia Preprocedure Evaluation (Addendum)
Anesthesia Evaluation  Patient identified by MRN, date of birth, ID band Patient awake    Reviewed: Allergy & Precautions, NPO status , Patient's Chart, lab work & pertinent test results  Airway Mallampati: II  TM Distance: >3 FB Neck ROM: Full    Dental no notable dental hx. (+) Teeth Intact   Pulmonary neg pulmonary ROS,    Pulmonary exam normal breath sounds clear to auscultation       Cardiovascular hypertension, Pt. on medications Normal cardiovascular exam Rhythm:Regular Rate:Normal     Neuro/Psych  Headaches, PSYCHIATRIC DISORDERS Anxiety Depression    GI/Hepatic Neg liver ROS, GERD  Medicated and Controlled,  Endo/Other  Morbid obesity  Renal/GU negative Renal ROS  negative genitourinary   Musculoskeletal negative musculoskeletal ROS (+)   Abdominal (+) + obese,   Peds  Hematology Thrombocytopenia - mild   Anesthesia Other Findings   Reproductive/Obstetrics (+) Pregnancy HTN                             Anesthesia Physical Anesthesia Plan  ASA: III and emergent  Anesthesia Plan: Epidural   Post-op Pain Management:    Induction:   PONV Risk Score and Plan: 4 or greater and Ondansetron, Treatment may vary due to age or medical condition and Scopolamine patch - Pre-op  Airway Management Planned: Natural Airway  Additional Equipment:   Intra-op Plan:   Post-operative Plan:   Informed Consent: I have reviewed the patients History and Physical, chart, labs and discussed the procedure including the risks, benefits and alternatives for the proposed anesthesia with the patient or authorized representative who has indicated his/her understanding and acceptance.       Plan Discussed with: Anesthesiologist, CRNA and Surgeon  Anesthesia Plan Comments: (C/Section for arrest of descent. Will use epidural catheter. M. Royce Macadamia, MD)       Anesthesia Quick Evaluation

## 2018-12-06 NOTE — Progress Notes (Signed)
Patient ID: Kimberly Howard, female   DOB: [birth year], 38 y.o.   MRN: [id number] Pt rested for 77mins. Just before resuming pushing, fetal decelerations with contractions noted again into the 70s.  Attempted pushing with no descent from 0 station.   Advised pt and husband due to persistent concerning strip remote from delivery, recommend delivery via cesarean section.  Reviewed risks/benefits of cesarean section Both parents agree. Consent signed  Terbutaline administered, anesthesia and staff notified and pt prepped for primary cesarean section  To OR when ready

## 2018-12-06 NOTE — Progress Notes (Signed)
Patient ID: Kimberly Howard, female   DOB: 1980/10/29, 38 y.o.   MRN: [id number] Pt doing well. Some cramps but no regular contractions. +Fms. Denies HA or blurry vision. Anxious VS- 155/91 EFM - cat 1, 155 TOCO - some irritability, no contractions SVE - closed/thick/high  A/P: 37yo G3P1011 at 37 2/[redacted]wks gestation with preE for iol         Cytotec placed; recheck in 4hrs or prn         GBS neg         Expectant mgmt

## 2018-12-06 NOTE — Anesthesia Procedure Notes (Signed)
Epidural Patient location during procedure: OB Start time: 12/06/2018 7:55 PM End time: 12/06/2018 8:03 PM  Staffing Anesthesiologist: Josephine Igo, MD Performed: anesthesiologist   Preanesthetic Checklist Completed: patient identified, site marked, surgical consent, pre-op evaluation, timeout performed, IV checked, risks and benefits discussed and monitors and equipment checked  Epidural Patient position: sitting Prep: site prepped and draped and DuraPrep Patient monitoring: continuous pulse ox and blood pressure Approach: midline Location: L3-L4 Injection technique: LOR air  Needle:  Needle type: Tuohy  Needle gauge: 17 G Needle length: 9 cm and 9 Needle insertion depth: 5 cm cm Catheter type: closed end flexible Catheter size: 19 Gauge Catheter at skin depth: 10 cm Test dose: negative and Other  Assessment Events: blood not aspirated, injection not painful, no injection resistance, negative IV test and no paresthesia  Additional Notes Patient identified. Risks and benefits discussed including failed block, incomplete  Pain control, post dural puncture headache, nerve damage, paralysis, blood pressure Changes, nausea, vomiting, reactions to medications-both toxic and allergic and post Partum back pain. All questions were answered. Patient expressed understanding and wished to proceed. Sterile technique was used throughout procedure. Epidural site was Dressed with sterile barrier dressing. No paresthesias, signs of intravascular injection Or signs of intrathecal spread were encountered.  Patient was more comfortable after the epidural was dosed. Please see RN's note for documentation of vital signs and FHR which are stable. Reason for block:procedure for pain

## 2018-12-07 ENCOUNTER — Encounter (HOSPITAL_COMMUNITY): Payer: Self-pay | Admitting: Obstetrics and Gynecology

## 2018-12-07 DIAGNOSIS — Z98891 History of uterine scar from previous surgery: Secondary | ICD-10-CM

## 2018-12-07 HISTORY — DX: History of uterine scar from previous surgery: Z98.891

## 2018-12-07 LAB — CBC
HCT: 31 % — ABNORMAL LOW (ref 36.0–46.0)
HCT: 31.3 % — ABNORMAL LOW (ref 36.0–46.0)
Hemoglobin: 10.8 g/dL — ABNORMAL LOW (ref 12.0–15.0)
Hemoglobin: 10.9 g/dL — ABNORMAL LOW (ref 12.0–15.0)
MCH: 32.1 pg (ref 26.0–34.0)
MCH: 32.1 pg (ref 26.0–34.0)
MCHC: 34.8 g/dL (ref 30.0–36.0)
MCHC: 34.8 g/dL (ref 30.0–36.0)
MCV: 92.3 fL (ref 80.0–100.0)
MCV: 92.1 fL (ref 80.0–100.0)
Platelets: 125 10*3/uL — ABNORMAL LOW (ref 150–400)
Platelets: 129 10*3/uL — ABNORMAL LOW (ref 150–400)
RBC: 3.36 MIL/uL — ABNORMAL LOW (ref 3.87–5.11)
RBC: 3.4 MIL/uL — ABNORMAL LOW (ref 3.87–5.11)
RDW: 13.5 % (ref 11.5–15.5)
RDW: 13.5 % (ref 11.5–15.5)
WBC: 21.2 10*3/uL — ABNORMAL HIGH (ref 4.0–10.5)
WBC: 19.8 10*3/uL — ABNORMAL HIGH (ref 4.0–10.5)
nRBC: 0 % (ref 0.0–0.2)
nRBC: 0 % (ref 0.0–0.2)

## 2018-12-07 MED ORDER — DIPHENHYDRAMINE HCL 25 MG PO CAPS: 25.0000 mg | ORAL_CAPSULE | Freq: Four times a day (QID) | ORAL | Status: DC | PRN

## 2018-12-07 MED ORDER — SIMETHICONE 80 MG PO CHEW
80.0000 mg | CHEWABLE_TABLET | Freq: Three times a day (TID) | ORAL | Status: DC
  Administered 2018-12-07 – 2018-12-10 (×10): 80 mg via ORAL
  Filled 2018-12-07 (×10): qty 1

## 2018-12-07 MED ORDER — FENTANYL CITRATE (PF) 100 MCG/2ML IJ SOLN
25.0000 ug | INTRAMUSCULAR | Status: DC | PRN
  Administered 2018-12-07 (×3): 50 ug via INTRAVENOUS

## 2018-12-07 MED ORDER — COCONUT OIL OIL: 1.0000 "application " | TOPICAL_OIL | Status: DC | PRN

## 2018-12-07 MED ORDER — FENTANYL CITRATE (PF) 100 MCG/2ML IJ SOLN
INTRAMUSCULAR | Status: AC
  Filled 2018-12-07: qty 2

## 2018-12-07 MED ORDER — ZOLPIDEM TARTRATE 5 MG PO TABS: 5.0000 mg | ORAL_TABLET | Freq: Every evening | ORAL | Status: DC | PRN

## 2018-12-07 MED ORDER — MEPERIDINE HCL 25 MG/ML IJ SOLN: 6.2500 mg | INTRAMUSCULAR | Status: DC | PRN

## 2018-12-07 MED ORDER — LACTATED RINGERS IV SOLN
INTRAVENOUS | Status: DC
  Administered 2018-12-07: 06:00:00 via INTRAVENOUS

## 2018-12-07 MED ORDER — CEFAZOLIN SODIUM-DEXTROSE 2-3 GM-%(50ML) IV SOLR
INTRAVENOUS | Status: DC | PRN
  Administered 2018-12-06: 2 g via INTRAVENOUS

## 2018-12-07 MED ORDER — OXYTOCIN 40 UNITS IN NORMAL SALINE INFUSION - SIMPLE MED: 2.5000 [IU]/h | INTRAVENOUS | Status: AC

## 2018-12-07 MED ORDER — NIFEDIPINE ER OSMOTIC RELEASE 30 MG PO TB24
30.0000 mg | ORAL_TABLET | Freq: Every day | ORAL | Status: DC
  Administered 2018-12-07 – 2018-12-08 (×2): 30 mg via ORAL
  Filled 2018-12-07 (×3): qty 1

## 2018-12-07 MED ORDER — DIBUCAINE (PERIANAL) 1 % EX OINT: 1.0000 "application " | TOPICAL_OINTMENT | CUTANEOUS | Status: DC | PRN

## 2018-12-07 MED ORDER — NALOXONE HCL 0.4 MG/ML IJ SOLN: 0.4000 mg | INTRAMUSCULAR | Status: DC | PRN

## 2018-12-07 MED ORDER — NALOXONE HCL 4 MG/10ML IJ SOLN
1.0000 ug/kg/h | INTRAVENOUS | Status: DC | PRN
  Filled 2018-12-07: qty 5

## 2018-12-07 MED ORDER — ONDANSETRON HCL 4 MG/2ML IJ SOLN: 4.0000 mg | Freq: Three times a day (TID) | INTRAMUSCULAR | Status: DC | PRN

## 2018-12-07 MED ORDER — SENNOSIDES-DOCUSATE SODIUM 8.6-50 MG PO TABS
2.0000 | ORAL_TABLET | ORAL | Status: DC
  Administered 2018-12-07 – 2018-12-09 (×3): 2 via ORAL
  Filled 2018-12-07 (×3): qty 2

## 2018-12-07 MED ORDER — LACTATED RINGERS IV SOLN: 500.0000 mL | Freq: Once | INTRAVENOUS | Status: DC

## 2018-12-07 MED ORDER — OXYCODONE HCL 5 MG PO TABS
5.0000 mg | ORAL_TABLET | ORAL | Status: DC | PRN
  Administered 2018-12-08 (×2): 10 mg via ORAL
  Administered 2018-12-08 – 2018-12-10 (×3): 5 mg via ORAL
  Filled 2018-12-07: qty 1
  Filled 2018-12-07: qty 2
  Filled 2018-12-07 (×2): qty 1
  Filled 2018-12-07: qty 2

## 2018-12-07 MED ORDER — DEXAMETHASONE SODIUM PHOSPHATE 10 MG/ML IJ SOLN
INTRAMUSCULAR | Status: DC | PRN
  Administered 2018-12-06: 10 mg via INTRAVENOUS

## 2018-12-07 MED ORDER — SIMETHICONE 80 MG PO CHEW
80.0000 mg | CHEWABLE_TABLET | ORAL | Status: DC | PRN
  Filled 2018-12-07: qty 1

## 2018-12-07 MED ORDER — SODIUM CHLORIDE 0.9% FLUSH: 3.0000 mL | INTRAVENOUS | Status: DC | PRN

## 2018-12-07 MED ORDER — DIPHENHYDRAMINE HCL 25 MG PO CAPS
25.0000 mg | ORAL_CAPSULE | ORAL | Status: DC | PRN
  Administered 2018-12-07: 21:00:00 25 mg via ORAL

## 2018-12-07 MED ORDER — NALBUPHINE HCL 10 MG/ML IJ SOLN: 5.0000 mg | Freq: Once | INTRAMUSCULAR | Status: DC | PRN

## 2018-12-07 MED ORDER — TETANUS-DIPHTH-ACELL PERTUSSIS 5-2.5-18.5 LF-MCG/0.5 IM SUSP: 0.5000 mL | Freq: Once | INTRAMUSCULAR | Status: DC

## 2018-12-07 MED ORDER — SODIUM CHLORIDE 0.9 % IR SOLN
Status: DC | PRN
  Administered 2018-12-07: 1000 mL

## 2018-12-07 MED ORDER — DIPHENHYDRAMINE HCL 50 MG/ML IJ SOLN: 12.5000 mg | INTRAMUSCULAR | Status: DC | PRN

## 2018-12-07 MED ORDER — WITCH HAZEL-GLYCERIN EX PADS: 1.0000 "application " | MEDICATED_PAD | CUTANEOUS | Status: DC | PRN

## 2018-12-07 MED ORDER — GABAPENTIN 100 MG PO CAPS
100.0000 mg | ORAL_CAPSULE | Freq: Two times a day (BID) | ORAL | Status: DC
  Administered 2018-12-07 – 2018-12-10 (×7): 100 mg via ORAL
  Filled 2018-12-07 (×7): qty 1

## 2018-12-07 MED ORDER — MORPHINE SULFATE (PF) 0.5 MG/ML IJ SOLN
INTRAMUSCULAR | Status: DC | PRN
  Administered 2018-12-07: 3 mg via EPIDURAL

## 2018-12-07 MED ORDER — ONDANSETRON HCL 4 MG/2ML IJ SOLN
INTRAMUSCULAR | Status: DC | PRN
  Administered 2018-12-06: 4 mg via INTRAVENOUS

## 2018-12-07 MED ORDER — CEFAZOLIN SODIUM-DEXTROSE 2-4 GM/100ML-% IV SOLN
INTRAVENOUS | Status: AC
  Filled 2018-12-07: qty 100

## 2018-12-07 MED ORDER — MENTHOL 3 MG MT LOZG: 1.0000 | LOZENGE | OROMUCOSAL | Status: DC | PRN

## 2018-12-07 MED ORDER — PRENATAL MULTIVITAMIN CH
1.0000 | ORAL_TABLET | Freq: Every day | ORAL | Status: DC
  Administered 2018-12-07 – 2018-12-10 (×4): 1 via ORAL
  Filled 2018-12-07 (×4): qty 1

## 2018-12-07 MED ORDER — NALBUPHINE HCL 10 MG/ML IJ SOLN
5.0000 mg | INTRAMUSCULAR | Status: DC | PRN
  Administered 2018-12-07: 5 mg via SUBCUTANEOUS
  Filled 2018-12-07: qty 1

## 2018-12-07 MED ORDER — SIMETHICONE 80 MG PO CHEW
80.0000 mg | CHEWABLE_TABLET | ORAL | Status: DC
  Administered 2018-12-07 – 2018-12-09 (×3): 80 mg via ORAL
  Filled 2018-12-07 (×3): qty 1

## 2018-12-07 MED ORDER — NALBUPHINE HCL 10 MG/ML IJ SOLN: 5.0000 mg | INTRAMUSCULAR | Status: DC | PRN

## 2018-12-07 NOTE — Plan of Care (Signed)
  Problem: Education: Goal: Knowledge of General Education information will improve Description: Including pain rating scale, medication(s)/side effects and non-pharmacologic comfort measures Outcome: Completed/Met

## 2018-12-07 NOTE — Op Note (Signed)
Operative Note    Preoperative Diagnosis: IUP at 37 2/7wks                                              Persistent cat 2 strip                                             Remote from delivery                                               Postoperative Diagnosis: Same                                               Asynclitic lie                                               Nuchal and body cord    Procedure: Primary low transverse cesarean section with double layered closure   Surgeon: Mickle Mallory DO  Anesthesia: Epidural  Fluids: LR 3L EBL: 521ml UOP: 11ml   Findings: Viable female infant in asynclitic vertex lie, nuchal x1, body cord x 3. Myomatous uterus ( multiple subserosal fibroids), grossly normal fallopian tubes and ovaries Apgars 8,9, weight pending   Specimen: Placenta to pathology   Procedure Note Patient was taken to the operating room where epidural anesthesia was evaluated and found to be adequate.  She was prepped with betadine  and draped in the normal sterile fashion in the dorsal supine position with a traxi placed and with a leftward tilt. An appropriate time out was performed.Allis clamp test confirmed adequate anesthesia.  A Pfannenstiel skin incision was then made with the scalpel and carried through to the underlying layer of fascia by sharp dissection and Bovie cautery. The fascia was nicked in the midline and the incision was extended laterally with Mayo scissors. The superior then inferior aspects of the incision were separated off the underlying rectus muscles bluntly.  Rectus muscles were separated in the midline and the peritoneal cavity entered bluntly. The Alexis self-retaining wound retractor was then placed within the incision and the lower uterine segment exposed.The lower uterine segment was then incised in a transverse fashion and the cavity itself entered bluntly. Infant was delivered easily via vertex. Nuchal was reduced and body delivered with body  cord disentangled. Vigorous cry noted. Cord was clamped and cut after a minute delay and infant handed off to awaiting nursery staff. Cord pH and blood were obtained.  The placenta was then spontaneously expressed from the uterus and the uterus cleared of all clots and debris with moist lap sponge. The uterine incision was then repaired in 2 layers the first layer was a running locked layer 1-0 chromic and the second an imbricating layer of the same suture. The tubes and ovaries were inspected.  The uterine incision was inspected again and confirmed to be hemostatic.  All instruments and sponges as well as the Alexis retractor were then removed from the abdomen. The rectus muscles and peritoneum were then reapproximated in a running fashion using 2-0 Vicryl. The fascia was then closed with 0 Vicryl in a running fashion. Subcutaneous tissue was reapproximated with 3-0 plain in a running fashion. The skin was closed with a subcuticular stitch of 4-0 Vicryl on a Keith needle and then reinforced with benzoin and Steri-Strips. At the conclusion of the procedure all instruments and sponge counts were correct. Patient was taken to the recovery room in good condition with her baby accompanying her skin to skin

## 2018-12-07 NOTE — Anesthesia Postprocedure Evaluation (Signed)
Anesthesia Post Note  Patient: Kimberly Howard  Procedure(s) Performed: CESAREAN SECTION (N/A )     Patient location during evaluation: PACU Anesthesia Type: Epidural Level of consciousness: awake and alert and oriented Pain management: pain level controlled Vital Signs Assessment: post-procedure vital signs reviewed and stable Respiratory status: spontaneous breathing, nonlabored ventilation and respiratory function stable Cardiovascular status: stable Postop Assessment: no headache, no backache, epidural receding, patient able to bend at knees and no apparent nausea or vomiting Anesthetic complications: no    Last Vitals:  Vitals:   12/07/18 0052 12/07/18 0100  BP: 128/65   Pulse: (!) 109 (!) 105  Resp: 20 19  Temp: 36.6 C   SpO2: 100% 100%    Last Pain:  Vitals:   12/07/18 0100  TempSrc:   PainSc: 0-No pain   Pain Goal:    LLE Motor Response: Purposeful movement (12/07/18 0100) LLE Sensation: Numbness (12/07/18 0100) RLE Motor Response: Purposeful movement (12/07/18 0100) RLE Sensation: Tingling (12/07/18 0100)     Epidural/Spinal Function Cutaneous sensation: Able to Wiggle Toes (12/07/18 0100), Patient able to flex knees: Yes (12/07/18 0100), Patient able to lift hips off bed: No (12/07/18 0100), Back pain beyond tenderness at insertion site: No (12/07/18 0100), Progressively worsening motor and/or sensory loss: No (12/07/18 0100), Bowel and/or bladder incontinence post epidural: No (12/07/18 0100)  Adal Sereno A.

## 2018-12-07 NOTE — Lactation Note (Signed)
This note was copied from a baby's chart. Lactation Consultation Note  Patient Name: Kimberly Howard WNIOE'V Date: 12/07/2018     [facility] Initial Visit:  Verified mother's feeding preference: mother desires to exclusively formula feed.  Lactation services are no longer needed.                  Kimberly Howard R Cope Marte 12/07/2018, 10:51 AM

## 2018-12-07 NOTE — Plan of Care (Signed)
  Problem: Education: Goal: Knowledge of General Education information will improve Description: Including pain rating scale, medication(s)/side effects and non-pharmacologic comfort measures Outcome: Completed/Met   Problem: Education: Goal: Knowledge of disease or condition will improve Outcome: Completed/Met Goal: Knowledge of the prescribed therapeutic regimen will improve Outcome: Completed/Met   Problem: Fluid Volume: Goal: Peripheral tissue perfusion will improve Outcome: Completed/Met   Problem: Education: Goal: Knowledge of condition will improve Outcome: Completed/Met

## 2018-12-07 NOTE — Progress Notes (Signed)
Patient ID: Kimberly Howard, female   DOB: [birth year], 38 y.o.   MRN: [id number] POD#1 Pt reports fatigued from lack of sleep but otherwise feels well. Denies HA/blurry vision/CP/SOB. Lochia mild. Pain well controlled. Tolerating diet well. Bonding well with baby - baby with hydrocele  VS 117-134/73, 80 GEN - NAD ABD - FF, dressing c/d/i EXT - no homans  19.8>10.9<129  A/P: POD#1 s/p pltc/s for NRFHT - stable         PReE - stable BP on procardia 30xl         Routine pp/post op care         Will consider referral to peds urology for circ if hydrocele diagnosed

## 2018-12-07 NOTE — Transfer of Care (Signed)
Immediate Anesthesia Transfer of Care Note  Patient: Kimberly Howard  Procedure(s) Performed: CESAREAN SECTION (N/A )  Patient Location: PACU  Anesthesia Type:Epidural  Level of Consciousness: awake, alert  and oriented  Airway & Oxygen Therapy: Patient Spontanous Breathing  Post-op Assessment: Report given to RN and Post -op Vital signs reviewed and stable  Post vital signs: Reviewed and stable  Last Vitals:  Vitals Value Taken Time  BP 128/65 12/07/18 0052  Temp    Pulse 101 12/07/18 0058  Resp 24 12/07/18 0058  SpO2 100 % 12/07/18 0058  Vitals shown include unvalidated device data.  Last Pain:  Vitals:   12/06/18 1931  TempSrc:   PainSc: 10-Worst pain ever         Complications: No apparent anesthesia complications

## 2018-12-08 LAB — RPR: RPR Ser Ql: NONREACTIVE

## 2018-12-08 MED ORDER — BISACODYL 10 MG RE SUPP
10.0000 mg | Freq: Once | RECTAL | Status: DC
  Filled 2018-12-08: qty 1

## 2018-12-08 NOTE — Progress Notes (Addendum)
Patient ID: Kimberly Howard, female   DOB: 05/03/1980, 38 y.o.   MRN: [id number] POD#2 Pt reports pelvic pain and discomfort with ambulation. Due for next dose of medication now. She is ambulating, eating and voiding well. She denies HA/CP/SOB. She is bonding well with baby; bottlefeeding.  VS - 135- 145/80-97, 78 GEN - NAD ABD - FF and dressing c/d/i EXT - no homans or edema   A/P: POD#2 s/p pltc/s for NRFHT - stable         PreE - BP mgmt; due for procardia 30xl at 10am; increase to 60 if indicated ( wa on 60xl at end of pregnancy)          Abdominal binder          Pain control           Routine pp/post op care  ** Per peds, defer circumcision at this time till fully assessed

## 2018-12-08 NOTE — Clinical Social Work Maternal (Signed)
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 [place] [address]    Phone number:  [phone] (home) [phone] (work)    Additional phone number:   Household Members/Support Persons (HM/SP):   Household Member/Support Person 1, Household Member/Support Person 2   HM/SP Name Relationship DOB or Age  HM/SP -1   FOB    HM/SP -2 Celesta Gentile Son 05/29/2013  HM/SP -3        HM/SP -4        HM/SP -5        HM/SP -6        HM/SP -7        HM/SP -8          Natural Supports (not living in the home):  Parent, Immediate Family, Extended Family   Professional Supports: None   Employment: Full-time   Type of Work: Company secretary   Education:  Public librarian arranged:    Museum/gallery curator Resources:  Multimedia programmer   Other Resources:      Cultural/Religious Considerations Which May Impact Care:    Strengths:  Ability to meet basic needs , Home prepared for child , Pediatrician chosen   Psychotropic Medications:         Pediatrician:    [place] area  Pediatrician List:   [facility]  [place]      Pediatrician Fax Number:    Risk Factors/Current Problems:      Cognitive State:  Able to Concentrate , Alert , Linear Thinking    Mood/Affect:  Calm , Interested    CSW Assessment:  CSW received consult for history of Anxiety and  Depression.  CSW met with MOB to offer support and complete assessment.    MOB sitting on side of bed with infant asleep in basinet and FOB present at bedside, when CSW entered the room. CSW introduced self and received verbal permission to complete assessment with FOB present. CSW explained reason for consult to which MOB was understanding. MOB pleasant but appeared to be in pain during assessment. CSW offered to come back at another time but MOB was welcoming of [place] visit. MOB reported she currently lives with FOB and their 36-year-old son. MOB stated she is currently employed full-time at [employer] and works in disability. CSW inquired about MOB's mental health history and MOB acknowledged being diagnosed with anxiety in her early 73's. MOB denied any recent symptoms or symptoms during pregnancy. MOB stated she has been prescribed medications in the past but has not been on anything in a few years and reported she doesn't feel it is anything she needs right now. MOB denied any PPD with her other child but was receptive to education. CSW provided education regarding the baby blues period vs. perinatal mood disorders, discussed treatment and gave resources for mental health follow up if concerns arise.  CSW recommends self-evaluation during the postpartum time period  using the New Mom Checklist from Postpartum Progress and encouraged MOB to contact a medical professional if symptoms are noted at any time. MOB denied any current SI or HI and reported having support from FOB, her parents, her siblings and her friends. MOB did not appear to be displaying any acute mental health symptoms. CSW inquired about infant's diagnosis of Rocky Morel malformation and how MOB and FOB have been processing diagnosis. MOB reported they are just glad infant is here and that they are coping the best they can. CSW provided MOB with information for [employer] and for SSI. MOB and FOB appreciative.   MOB and FOB  confirmed having all essential items for infant once discharged and reported infant would be sleeping in a basinet once home. CSW provided review of Sudden Infant Death Syndrome (SIDS) precautions and safe sleeping habits.      CSW Plan/Description:  No Further Intervention Required/No Barriers to Discharge, Sudden Infant Death Syndrome (SIDS) Education, Perinatal Mood and Anxiety Disorder (PMADs) Education, Theatre stage manager Income (SSI) Information, Other Information/Referral to [place], [place] 12/08/2018, 4:07 PM

## 2018-12-08 NOTE — Progress Notes (Signed)
CSW acknowledged consult and attempted to meet with MOB. However, MOB was trying to sleep and requested CSW return later.  CSW will meet with MOB at a later time.  Kimberly Howard, [place]  [facility] and [employer] [phone]

## 2018-12-08 NOTE — Plan of Care (Signed)
  Problem: Clinical Measurements: Goal: Ability to maintain clinical measurements within normal limits will improve Outcome: Completed/Met Goal: Will remain free from infection Outcome: Completed/Met Goal: Diagnostic test results will improve Outcome: Completed/Met Goal: Cardiovascular complication will be avoided Outcome: Completed/Met   Problem: Activity: Goal: Risk for activity intolerance will decrease Outcome: Completed/Met   Problem: Nutrition: Goal: Adequate nutrition will be maintained Outcome: Completed/Met   Problem: Elimination: Goal: Will not experience complications related to bowel motility Outcome: Completed/Met Goal: Will not experience complications related to urinary retention Outcome: Completed/Met   Problem: Pain Managment: Goal: General experience of comfort will improve Outcome: Completed/Met   Problem: Safety: Goal: Ability to remain free from injury will improve Outcome: Completed/Met   Problem: Life Cycle: Goal: Ability to make normal progression through stages of labor will improve Outcome: Completed/Met   Problem: Role Relationship: Goal: Will demonstrate positive interactions with the child Outcome: Completed/Met   Problem: Safety: Goal: Risk of complications during labor and delivery will decrease Outcome: Completed/Met   Problem: Pain Management: Goal: Relief or control of pain from uterine contractions will improve Outcome: Completed/Met   Problem: Clinical Measurements: Goal: Complications related to disease process, condition or treatment will be avoided or minimized Outcome: Completed/Met

## 2018-12-09 MED ORDER — BISACODYL 10 MG RE SUPP
10.0000 mg | Freq: Once | RECTAL | Status: AC
  Administered 2018-12-09: 10 mg via RECTAL
  Filled 2018-12-09: qty 1

## 2018-12-09 MED ORDER — NIFEDIPINE ER OSMOTIC RELEASE 30 MG PO TB24
60.0000 mg | ORAL_TABLET | Freq: Every day | ORAL | Status: DC
  Administered 2018-12-09: 60 mg via ORAL
  Filled 2018-12-09: qty 2

## 2018-12-09 MED ORDER — NIFEDIPINE ER OSMOTIC RELEASE 30 MG PO TB24
30.0000 mg | ORAL_TABLET | Freq: Once | ORAL | Status: AC
  Administered 2018-12-09: 19:00:00 30 mg via ORAL
  Filled 2018-12-09: qty 1

## 2018-12-09 MED ORDER — NIFEDIPINE ER OSMOTIC RELEASE 30 MG PO TB24
90.0000 mg | ORAL_TABLET | Freq: Every day | ORAL | Status: DC
  Administered 2018-12-10: 10:00:00 90 mg via ORAL
  Filled 2018-12-09 (×2): qty 3

## 2018-12-09 MED ORDER — DOCUSATE SODIUM 100 MG PO CAPS
100.0000 mg | ORAL_CAPSULE | Freq: Once | ORAL | Status: AC
  Administered 2018-12-09: 100 mg via ORAL
  Filled 2018-12-09: qty 1

## 2018-12-09 NOTE — Progress Notes (Signed)
Subjective: Postpartum Day 3: Cesarean Delivery Patient reports incisional pain and gas pain, passing small amounts of flatus.  Nml lochia, baby doing well on bottle.    Objective: Vital signs in last 24 hours: Temp:  [98.1 F (36.7 C)-98.4 F (36.9 C)] 98.1 F (36.7 C) (08/17 0854) Pulse Rate:  [77-98] 77 (08/17 0854) Resp:  [18-19] 18 (08/17 0854) BP: (130-148)/(88-103) 135/103 (08/17 0854) SpO2:  [98 %-100 %] 98 % (08/17 0546)  Physical Exam:  General: alert and cooperative Lochia: appropriate Uterine Fundus: firm Incision: C/D/I   Recent Labs    12/07/18 0151 12/07/18 0527  HGB 10.8* 10.9*  HCT 31.0* 31.3*    Assessment/Plan: Status post Cesarean section. Postoperative course complicated by increasing BP this AM.   Will increase procardia to 60mg  and monitor through day. May try another dulcolax for gas. Assess this PM for potential d/c   Logan Bores 12/09/2018, 9:21 AM

## 2018-12-09 NOTE — Progress Notes (Signed)
Patient ID: Kimberly Howard, female   DOB: [birth year], 38 y.o.   MRN: [id number] BP trending up this PM despite increase in Procardia to 60mg XL this AM, will keep overnight again to observe Procrdia 30mg  XL given now and will increase AM dose to 90mg XL. Pt feeling well overall.

## 2018-12-10 ENCOUNTER — Other Ambulatory Visit: Payer: Self-pay

## 2018-12-10 MED ORDER — PRENATAL VITAMIN 27-0.8 MG PO TABS: 1.0000 | ORAL_TABLET | Freq: Every morning | ORAL | 3 refills | Status: DC

## 2018-12-10 MED ORDER — NIFEDIPINE ER OSMOTIC RELEASE 30 MG PO TB24: 90.0000 mg | ORAL_TABLET | Freq: Every day | ORAL | 1 refills | Status: DC

## 2018-12-10 MED ORDER — OXYCODONE HCL 5 MG PO TABS: 5.0000 mg | ORAL_TABLET | Freq: Four times a day (QID) | ORAL | 0 refills | Status: DC | PRN

## 2018-12-10 NOTE — Progress Notes (Signed)
Subjective: Postpartum Day 3: Cesarean Delivery Patient reports incisional pain, tolerating PO and no problems voiding.  Nl lochia, pain controlled.  BP controlled at 140/90's on 90 procardia  Objective: Vital signs in last 24 hours: Temp:  [98.1 F (36.7 C)-98.6 F (37 C)] 98.6 F (37 C) (08/18 0509) Pulse Rate:  [77-97] 87 (08/18 0509) Resp:  [18] 18 (08/18 0509) BP: (132-149)/(86-103) 132/90 (08/18 0509) SpO2:  [100 %] 100 % (08/18 0509)  Physical Exam:  General: alert and no distress Lochia: appropriate Uterine Fundus: firm Incision: healing well DVT Evaluation: No evidence of DVT seen on physical exam.  No results for input(s): HGB, HCT in the last 72 hours.  Assessment/Plan: Status post Cesarean section. Doing well postoperatively.  Discharge home with standard precautions and return to clinic in 1,2 and 6 weeks. Will d/c with Motrin, oxycodone, PNV and Procardia 90, to come for BP check in 1 week.    Deola Rewis Bovard-Stuckert 12/10/2018, 8:16 AM

## 2018-12-10 NOTE — Discharge Summary (Signed)
OB Discharge Summary     Patient Name: Kimberly Howard DOB: 23-Apr-1981 MRN: [id number]  Date of admission: 12/06/2018 Delivering MD: [name] [facility]   Date of discharge: 12/10/2018  Admitting diagnosis: pregnancy Intrauterine pregnancy: [redacted]w[redacted]d     Secondary diagnosis:  Active Problems:   Indication for care in labor or delivery   Status post cesarean delivery   Postpartum care following cesarean delivery  Additional problems: PIH, blood loss anemia     Discharge diagnosis: Term Pregnancy Delivered and PIH                                                                                                Post partum procedures:N/A  Augmentation: AROM, Pitocin and Cytotec  Complications: None  Hospital course:  Induction of Labor With Cesarean Section  38 y.o. yo [id number] at [redacted]w[redacted]d was admitted to the hospital 12/06/2018 for induction of labor. Patient had a labor course significant for arrest of descent, fetal decelerations. The patient went for cesarean section due to Arrest of Descent, and delivered a Viable infant,12/06/2018  Membrane Rupture Time/Date: 8:13 PM ,12/06/2018   Details of operation can be found in separate operative Note.  Patient had an uncomplicated postpartum course. She is ambulating, tolerating a regular diet, passing flatus, and urinating well.  Patient is discharged home in stable condition on 12/10/18.                                    Physical exam  Vitals:   12/09/18 1817 12/09/18 2030 12/09/18 2208 12/10/18 0509  BP: (!) 140/96 (!) 146/91 (!) 149/91 132/90  Pulse: 97  96 87  Resp:   18 18  Temp:   98.4 F (36.9 C) 98.6 F (37 C)  TempSrc:   Oral Oral  SpO2:    100%  Weight:      Height:       General: alert Lochia: appropriate Uterine Fundus: firm Incision: Healing well with no significant drainage DVT Evaluation: No evidence of DVT seen on physical exam. Labs: Lab Results  Component Value Date   WBC 19.8 (H) 12/07/2018   HGB 10.9 (L)  12/07/2018   HCT 31.3 (L) 12/07/2018   MCV 92.1 12/07/2018   PLT 129 (L) 12/07/2018   CMP Latest Ref Rng & Units 12/06/2018  Glucose 70 - 99 mg/dL 112(H)  BUN 6 - 20 mg/dL 6  Creatinine 0.44 - 1.00 mg/dL 0.80  Sodium 135 - 145 mmol/L 138  Potassium 3.5 - 5.1 mmol/L 3.4(L)  Chloride 98 - 111 mmol/L 109  CO2 22 - 32 mmol/L 19(L)  Calcium 8.9 - 10.3 mg/dL 8.8(L)  Total Protein 6.5 - 8.1 g/dL 5.6(L)  Total Bilirubin 0.3 - 1.2 mg/dL 0.4  Alkaline Phos 38 - 126 U/L 81  AST 15 - 41 U/L 25  ALT 0 - 44 U/L 19    Discharge instruction: per After Visit Summary and "Baby and Me Booklet".  After visit meds:  Allergies as of 12/10/2018  Reactions   Adhesive [tape]       Medication List    STOP taking these medications   aspirin EC 81 MG tablet   multivitamin tablet     TAKE these medications   butalbital-acetaminophen-caffeine 50-325-40 MG tablet Commonly known as: FIORICET Take 1 tablet by mouth every 4 (four) hours as needed for headache.   famotidine 20 MG tablet Commonly known as: PEPCID Take 1 tablet (20 mg total) by mouth 2 (two) times daily.   NIFEdipine 30 MG 24 hr tablet Commonly known as: PROCARDIA-XL/NIFEDICAL-XL Take 3 tablets (90 mg total) by mouth daily. What changed: how much to take   oxyCODONE 5 MG immediate release tablet Commonly known as: Oxy IR/ROXICODONE Take 1-2 tablets (5-10 mg total) by mouth every 6 (six) hours as needed for moderate pain or severe pain.   Prenatal Vitamin 27-0.8 MG Tabs Take 1 tablet by mouth every morning. What changed:   medication strength  how much to take  when to take this   Tylenol 325 MG Caps Generic drug: Acetaminophen Take by mouth.       Diet: routine diet  Activity: Advance as tolerated. Pelvic rest for 6 weeks.   Outpatient follow up:1, 2 and 6 weeks Follow up Appt:No future appointments. Follow up Visit:No follow-ups on file.  Postpartum contraception: Undecided  Newborn Data: Live born  female  Birth Weight: 7 lb 6.9 oz (3370 g) APGAR: 8, 9  Newborn Delivery   Birth date/time: 12/06/2018 23:52:00 Delivery type: C-Section, Low Transverse Trial of labor: Yes C-section categorization: Primary      Baby Feeding: Bottle Disposition:home with mother   12/10/2018 Janyth Contes, MD

## 2018-12-17 DIAGNOSIS — Q043 Other reduction deformities of brain: Secondary | ICD-10-CM | POA: Diagnosis not present

## 2018-12-26 DIAGNOSIS — I10 Essential (primary) hypertension: Secondary | ICD-10-CM | POA: Diagnosis not present

## 2019-01-03 DIAGNOSIS — F418 Other specified anxiety disorders: Secondary | ICD-10-CM | POA: Diagnosis not present

## 2019-01-07 DIAGNOSIS — F4323 Adjustment disorder with mixed anxiety and depressed mood: Secondary | ICD-10-CM | POA: Diagnosis not present

## 2019-01-15 DIAGNOSIS — F4323 Adjustment disorder with mixed anxiety and depressed mood: Secondary | ICD-10-CM | POA: Diagnosis not present

## 2019-01-21 DIAGNOSIS — Z23 Encounter for immunization: Secondary | ICD-10-CM | POA: Diagnosis not present

## 2019-01-21 DIAGNOSIS — Z1389 Encounter for screening for other disorder: Secondary | ICD-10-CM | POA: Diagnosis not present

## 2019-01-21 DIAGNOSIS — Z3009 Encounter for other general counseling and advice on contraception: Secondary | ICD-10-CM | POA: Diagnosis not present

## 2019-01-29 DIAGNOSIS — F4323 Adjustment disorder with mixed anxiety and depressed mood: Secondary | ICD-10-CM | POA: Diagnosis not present

## 2019-02-05 NOTE — Progress Notes (Signed)
Assessment and Plan:  Diagnoses and all orders for this visit:  Essential hypertension Increase verapamil to 270 mg daily for BP goal <130/80 Monitor blood pressure at home; call if consistently over 130/80 Discussed DASH diet Advised to go to the ER if any CP, SOB, nausea, dizziness, severe HA, changes vision/speech, left arm numbness and tingling and jaw pain. Follow up in 4-6 weeks for recheck and CPE -     COMPLETE METABOLIC PANEL WITH GFR  Liver enzyme elevation -     COMPLETE METABOLIC PANEL WITH GFR  Status post cesarean delivery Recovering well, no concerns, surgical site well healed Baby is doing well  Anemia, unspecified type Continue prenatal with iron -     CBC with Differential/Platelet  BMI 35.0-35.9,adult Interested in restarting on phentermine for aggressive weight loss this year; report success with this in the past She is not breastfeeding, not sexually active; husband will be scheduling appointment for vasectomy Defer initiating today; will control BP, follow up in 4-6 weeks for CPE with EKG If BP controlled at that point will plan to initiate with close follow up  Further disposition pending results of labs. Discussed med's effects and SE's.   Over 30 minutes of exam, counseling, chart review, and critical decision making was performed.   Future Appointments  Date Time Provider [place]  03/17/2019  2:00 PM Liane Comber, NP [facility] None    ------------------------------------------------------------------------------------------------------------------   HPI BP 138/88   Pulse 82   Temp (!) 97.1 F (36.2 C)   Wt 208 lb 12.8 oz (94.7 kg)   LMP 03/20/2018 (Exact Date)   SpO2 99%   BMI 35.84 kg/m   38 y.o.female AA, with hx of preeclampsia, recently on 12/06/2018 s/p cesarean section with baby boy #2 presents for evaluation of BP.   She has been on verapamil for migraine in the past. She had been off of verapamil per OBGYN  recommendation due to pregnancy. Was on procardia XR 90 mg daily, now back on verapamil 180 mg.   She is unsure what her BP is running at home, cuff keeps giving error message, today their BP is BP: 138/88 She denies chest pain, shortness of breath, dizziness.   She is off of birth control, she is not currently sexually active. Husband will be getting vasectomy shortly. She is not breastfeeding.  Patient's last menstrual period was 03/20/2018 (exact date).   Lab Results  Component Value Date   WBC 19.8 (H) 12/07/2018   HGB 10.9 (L) 12/07/2018   HCT 31.3 (L) 12/07/2018   MCV 92.1 12/07/2018   PLT 129 (L) 12/07/2018    BMI is Body mass index is 35.84 kg/m., she has been working on diet and exercise. She has been on phentermine in the past with successful weight loss and is interested in restarting soon.  Wt Readings from Last 3 Encounters:  02/06/19 208 lb 12.8 oz (94.7 kg)  12/07/18 230 lb (104.3 kg)  08/13/18 209 lb 3.2 oz (94.9 kg)   She had anemia following C section; she cannot recall if this has been rechecked; she does continue to irregularly take prenatal containing iron Lab Results  Component Value Date   WBC 19.8 (H) 12/07/2018   HGB 10.9 (L) 12/07/2018   HCT 31.3 (L) 12/07/2018   MCV 92.1 12/07/2018   PLT 129 (L) 12/07/2018     Past Medical History:  Diagnosis Date  . Anxiety   . Depression   . Fibroid   . History of pre-eclampsia   .  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 Migraines   . Preeclampsia 05/28/2013  . Status post cesarean delivery 12/07/2018     Allergies  Allergen Reactions  . Adhesive [Tape]     Current Outpatient Medications on File Prior to Visit  Medication Sig  . Acetaminophen (TYLENOL) 325 MG CAPS Take by mouth as needed.   Marland Kitchen buPROPion (WELLBUTRIN XL) 150 MG 24 hr tablet Take 150 mg by mouth 2 (two) times daily.  . butalbital-acetaminophen-caffeine (FIORICET) 50-325-40 MG tablet Take 1 tablet by mouth every 4  (four) hours as needed for headache.  . Prenatal Vit-Fe Fumarate-FA (PRENATAL VITAMIN) 27-0.8 MG TABS Take 1 tablet by mouth every morning.  . verapamil (CALAN-SR) 180 MG CR tablet Take 270 mg by mouth at bedtime.   No current facility-administered medications on file prior to visit.     ROS: Review of Systems  Constitutional: Negative for chills, diaphoresis, fever, malaise/fatigue and weight loss.  HENT: Negative.   Eyes: Negative.   Respiratory: Negative for cough, hemoptysis, sputum production, shortness of breath and wheezing.   Cardiovascular: Negative for chest pain, palpitations, orthopnea, claudication, leg swelling and PND.  Gastrointestinal: Negative for abdominal pain, blood in stool, constipation, diarrhea, heartburn, melena, nausea and vomiting.  Genitourinary: Negative.  Negative for dysuria, flank pain, frequency, hematuria and urgency.  Musculoskeletal: Negative for joint pain and myalgias.  Skin: Negative for itching and rash.  Neurological: Negative for dizziness and headaches.    Physical Exam:  BP 138/88   Pulse 82   Temp (!) 97.1 F (36.2 C)   Wt 208 lb 12.8 oz (94.7 kg)   LMP 03/20/2018 (Exact Date)   SpO2 99%   BMI 35.84 kg/m   General Appearance: Well nourished, in no apparent distress. Eyes: PERRLA, conjunctiva no swelling or erythema Sinuses: No Frontal/maxillary tenderness ENT/Mouth: Ext aud canals clear, TMs without erythema, bulging. No erythema, swelling, or exudate on post pharynx.  Tonsils not swollen or erythematous. Hearing normal.  Neck: Supple, thyroid normal.  Respiratory: Respiratory effort normal, BS equal bilaterally without rales, rhonchi, wheezing or stridor.  Cardio: Tachycardic with no MRGs. Brisk peripheral pulses without edema.  Abdomen: Soft, + BS.  Non tender, no guarding, rebound, hernias, masses. Lymphatics: Non tender without lymphadenopathy.  Musculoskeletal: Symmetrical strength, normal gait.  Skin: Warm, dry without  rashes, lesions, ecchymosis. Well healed c section scar suprapubic. Neuro: Cranial nerves intact. Normal muscle tone Psych: Awake and oriented X 3, normal affect, Insight and Judgment appropriate.    Izora Ribas, NP 5:37 PM [facility]

## 2019-02-06 ENCOUNTER — Other Ambulatory Visit: Payer: Self-pay

## 2019-02-06 ENCOUNTER — Encounter: Payer: Self-pay | Admitting: Adult Health

## 2019-02-06 ENCOUNTER — Ambulatory Visit: Payer: BLUE CROSS/BLUE SHIELD | Admitting: Adult Health

## 2019-02-06 VITALS — BP 138/88 | HR 82 | Temp 97.1°F | Wt 208.8 lb

## 2019-02-06 DIAGNOSIS — R748 Abnormal levels of other serum enzymes: Secondary | ICD-10-CM | POA: Diagnosis not present

## 2019-02-06 DIAGNOSIS — I10 Essential (primary) hypertension: Secondary | ICD-10-CM | POA: Diagnosis not present

## 2019-02-06 DIAGNOSIS — D649 Anemia, unspecified: Secondary | ICD-10-CM | POA: Diagnosis not present

## 2019-02-06 DIAGNOSIS — Z98891 History of uterine scar from previous surgery: Secondary | ICD-10-CM | POA: Diagnosis not present

## 2019-02-06 DIAGNOSIS — Z6835 Body mass index (BMI) 35.0-35.9, adult: Secondary | ICD-10-CM

## 2019-02-06 NOTE — Patient Instructions (Addendum)
Goals    . Blood Pressure < 130/80    . Weight (lb) < 200 lb (90.7 kg)      Increase verapamil to 1.5 tabs at night for blood pressure goal of <130/80; if continue to get error message, go ahead and buy a new blood pressure cuff  Message me when close to running out to let me know how blood pressures are doing  If blood pressures are doing well at your physical, we will get an EKG and start phentermine   Go ahead and schedule urology appointment for husband.     HYPERTENSION INFORMATION  Monitor your blood pressure at home, please keep a record and bring that in with you to your next office visit.   Go to the ER if any CP, SOB, nausea, dizziness, severe HA, changes vision/speech  Testing/Procedures: HOW TO TAKE YOUR BLOOD PRESSURE:  Rest 5 minutes before taking your blood pressure.  Don't smoke or drink caffeinated beverages for at least 30 minutes before.  Take your blood pressure before (not after) you eat.  Sit comfortably with your back supported and both feet on the floor (don't cross your legs).  Elevate your arm to heart level on a table or a desk.  Use the proper sized cuff. It should fit smoothly and snugly around your bare upper arm. There should be enough room to slip a fingertip under the cuff. The bottom edge of the cuff should be 1 inch above the crease of the elbow.   Your most recent BP: BP: 138/88   Take your medications faithfully as instructed. Maintain a healthy weight. Get at least 150 minutes of aerobic exercise per week. Minimize salt intake. Minimize alcohol intake  DASH Eating Plan DASH stands for "Dietary Approaches to Stop Hypertension." The DASH eating plan is a healthy eating plan that has been shown to reduce high blood pressure (hypertension). Additional health benefits may include reducing the risk of type 2 diabetes mellitus, heart disease, and stroke. The DASH eating plan may also help with weight loss. WHAT DO I NEED TO KNOW ABOUT THE  DASH EATING PLAN? For the DASH eating plan, you will follow these general guidelines:  Choose foods with a percent daily value for sodium of less than 5% (as listed on the food label).  Use salt-free seasonings or herbs instead of table salt or sea salt.  Check with your health care provider or pharmacist before using salt substitutes.  Eat lower-sodium products, often labeled as "lower sodium" or "no salt added."  Eat fresh foods.  Eat more vegetables, fruits, and low-fat dairy products.  Choose whole grains. Look for the word "whole" as the first word in the ingredient list.  Choose fish and skinless chicken or [place] more often than red meat. Limit fish, poultry, and meat to 6 oz (170 g) each day.  Limit sweets, desserts, sugars, and sugary drinks.  Choose heart-healthy fats.  Limit cheese to 1 oz (28 g) per day.  Eat more home-cooked food and less restaurant, buffet, and fast food.  Limit fried foods.  Cook foods using methods other than frying.  Limit canned vegetables. If you do use them, rinse them well to decrease the sodium.  When eating at a restaurant, ask that your food be prepared with less salt, or no salt if possible. WHAT FOODS CAN I EAT? Seek help from a dietitian for individual calorie needs. Grains Whole grain or whole wheat bread. Brown rice. Whole grain or whole wheat pasta. Bernardo Heater,  bulgur, and whole grain cereals. Low-sodium cereals. Corn or whole wheat flour tortillas. Whole grain cornbread. Whole grain crackers. Low-sodium crackers. Vegetables Fresh or frozen vegetables (raw, steamed, roasted, or grilled). Low-sodium or reduced-sodium tomato and vegetable juices. Low-sodium or reduced-sodium tomato sauce and paste. Low-sodium or reduced-sodium canned vegetables.  Fruits All fresh, canned (in natural juice), or frozen fruits. Meat and Other Protein Products Ground beef (85% or leaner), grass-fed beef, or beef trimmed of fat. Skinless chicken or  [place]. Ground chicken or [place]. Pork trimmed of fat. All fish and seafood. Eggs. Dried beans, peas, or lentils. Unsalted nuts and seeds. Unsalted canned beans. Dairy Low-fat dairy products, such as skim or 1% milk, 2% or reduced-fat cheeses, low-fat ricotta or cottage cheese, or plain low-fat yogurt. Low-sodium or reduced-sodium cheeses. Fats and Oils Tub margarines without trans fats. Light or reduced-fat mayonnaise and salad dressings (reduced sodium). Avocado. Safflower, olive, or canola oils. Natural peanut or almond butter. Other Unsalted popcorn and pretzels. The items listed above may not be a complete list of recommended foods or beverages. Contact your dietitian for more options. WHAT FOODS ARE NOT RECOMMENDED? Grains White bread. White pasta. White rice. Refined cornbread. Bagels and croissants. Crackers that contain trans fat. Vegetables Creamed or fried vegetables. Vegetables in a cheese sauce. Regular canned vegetables. Regular canned tomato sauce and paste. Regular tomato and vegetable juices. Fruits Dried fruits. Canned fruit in light or heavy syrup. Fruit juice. Meat and Other Protein Products Fatty cuts of meat. Ribs, chicken wings, bacon, sausage, bologna, salami, chitterlings, fatback, hot dogs, bratwurst, and packaged luncheon meats. Salted nuts and seeds. Canned beans with salt. Dairy Whole or 2% milk, cream, half-and-half, and cream cheese. Whole-fat or sweetened yogurt. Full-fat cheeses or blue cheese. Nondairy creamers and whipped toppings. Processed cheese, cheese spreads, or cheese curds. Condiments Onion and garlic salt, seasoned salt, table salt, and sea salt. Canned and packaged gravies. Worcestershire sauce. Tartar sauce. Barbecue sauce. Teriyaki sauce. Soy sauce, including reduced sodium. Steak sauce. Fish sauce. Oyster sauce. Cocktail sauce. Horseradish. Ketchup and mustard. Meat flavorings and tenderizers. Bouillon cubes. Hot sauce. Tabasco sauce. Marinades.  Taco seasonings. Relishes. Fats and Oils Butter, stick margarine, lard, shortening, ghee, and bacon fat. Coconut, palm kernel, or palm oils. Regular salad dressings. Other Pickles and olives. Salted popcorn and pretzels. The items listed above may not be a complete list of foods and beverages to avoid. Contact your dietitian for more information. WHERE CAN I FIND MORE INFORMATION? National Heart, Lung, and Blood Institute: travelstabloid.com Document Released: 03/30/2011 Document Revised: 08/25/2013 Document Reviewed: 02/12/2013 [employer] Patient Information 2015 [place], [place]. This information is not intended to replace advice given to you by your health care provider. Make sure you discuss any questions you have with your health care provider.

## 2019-02-07 LAB — CBC WITH DIFFERENTIAL/PLATELET
Absolute Monocytes: 846 cells/uL (ref 200–950)
Basophils Absolute: 82 cells/uL (ref 0–200)
Basophils Relative: 0.9 %
Eosinophils Absolute: 127 cells/uL (ref 15–500)
Eosinophils Relative: 1.4 %
HCT: 39.6 % (ref 35.0–45.0)
Hemoglobin: 13.1 g/dL (ref 11.7–15.5)
Lymphs Abs: 3158 cells/uL (ref 850–3900)
MCH: 30.7 pg (ref 27.0–33.0)
MCHC: 33.1 g/dL (ref 32.0–36.0)
MCV: 92.7 fL (ref 80.0–100.0)
MPV: 11.3 fL (ref 7.5–12.5)
Monocytes Relative: 9.3 %
Neutro Abs: 4887 cells/uL (ref 1500–7800)
Neutrophils Relative %: 53.7 %
Platelets: 325 10*3/uL (ref 140–400)
RBC: 4.27 10*6/uL (ref 3.80–5.10)
RDW: 12.1 % (ref 11.0–15.0)
Total Lymphocyte: 34.7 %
WBC: 9.1 10*3/uL (ref 3.8–10.8)

## 2019-02-07 LAB — COMPLETE METABOLIC PANEL WITH GFR
AG Ratio: 1.4 (calc) (ref 1.0–2.5)
ALT: 38 U/L — ABNORMAL HIGH (ref 6–29)
AST: 33 U/L — ABNORMAL HIGH (ref 10–30)
Albumin: 4.1 g/dL (ref 3.6–5.1)
Alkaline phosphatase (APISO): 54 U/L (ref 31–125)
BUN: 9 mg/dL (ref 7–25)
CO2: 27 mmol/L (ref 20–32)
Calcium: 9.7 mg/dL (ref 8.6–10.2)
Chloride: 104 mmol/L (ref 98–110)
Creat: 0.77 mg/dL (ref 0.50–1.10)
GFR, Est African American: 115 mL/min/{1.73_m2} (ref >=60)
GFR, Est Non African American: 99 mL/min/{1.73_m2} (ref >=60)
Globulin: 3 g/dL (calc) (ref 1.9–3.7)
Glucose, Bld: 86 mg/dL (ref 65–99)
Potassium: 4 mmol/L (ref 3.5–5.3)
Sodium: 138 mmol/L (ref 135–146)
Total Bilirubin: 0.2 mg/dL (ref 0.2–1.2)
Total Protein: 7.1 g/dL (ref 6.1–8.1)

## 2019-03-03 ENCOUNTER — Other Ambulatory Visit: Payer: Self-pay | Admitting: Adult Health

## 2019-03-03 MED ORDER — VERAPAMIL HCL ER 240 MG PO TBCR: 240.0000 mg | EXTENDED_RELEASE_TABLET | Freq: Every day | ORAL | 1 refills | Status: DC

## 2019-03-13 NOTE — Progress Notes (Signed)
Complete Physical  Assessment and Plan:  Shann was seen today for annual exam.  Diagnoses and all orders for this visit:  Encounter for routine adult health examination with abnormal findings   Essential hypertension Continue medication: verapmil for headaches/BP, consider adding hctz if BPs persistently above goal  Monitor blood pressure at home; call if consistently over 130/80 Continue DASH diet.   Reminder to go to the ER if any CP, SOB, nausea, dizziness, severe HA, changes vision/speech, left arm numbness and tingling and jaw pain. -     CBC with Diff -     COMPLETE METABOLIC PANEL WITH GFR -     Magnesium -     TSH -     Microalbumin / Creatinine Urine Ratio -     Urinalysis, Routine w reflex microscopic -     EKG 12-Lead  Other migraine without status migrainosus, not intractable Recently improved; continue verapmil; headaches resolve with OTC NSAIDs  TMJ (temporomandibular joint syndrome) Improved with lifestyle; follows dentist; flexeril PRN is helpful if needed  Morbid obesity ([place]) Long discussion about weight loss, diet, and exercise Discussed goal weight and initial weight loss goal (<200lb) Patient will work on portions, increase exercise Will start the patient on phentermine- hand out given and AE's discussed, will do close follow up. Return in 4 weeks.  -     Hemoglobin A1c ([facility])  Depression, unspecified depression type She reports is doing fairly; would like to taper off of wellbutrin due to lack of perceived benefit; suggested 1 tab x 2 weeks then stop If having problems with mood may benefit more from SSRI due to anxious tendencies Lifestyle discussed: diet/exerise, sleep hygiene, stress management, hydration  Vitamin D deficiency -     Vitamin D (25 hydroxy)  Liver enzyme elevation Check labs, avoid tylenol, alcohol, weight loss advised.  [place] if persistent and unexplained -     COMPLETE METABOLIC PANEL WITH GFR -     Hepatitis Acute  Panel  Status post cesarean delivery Doing well; follow up GYN as recommended  Screening for diabetes mellitus -     Hemoglobin A1c ([facility])  Screening cholesterol level -     Lipid Profile  Screening for thyroid disorder -     TSH  Other orders -     phentermine (ADIPEX-P) 37.5 MG tablet; Take 1/2 to 1 tablet every morning as needed for dieting & weight loss. Take regular drug breaks.   Discussed med's effects and SE's. Screening labs and tests as requested with regular follow-up as recommended. Over 40 minutes of exam, counseling, chart review, and complex, high level critical decision making was performed this visit.   Future Appointments  Date Time Provider [place]  04/28/2019  4:30 PM Liane Comber, NP [facility] None  03/16/2020  2:00 PM Liane Comber, NP [facility] None    HPI  38 y.o. female  presents for a complete physical and follow up for has Essential hypertension; Migraine; TMJ (temporomandibular joint syndrome); Morbid obesity ([place]); Depression; Vitamin D deficiency; Liver enzyme elevation; and Status post cesarean delivery on their problem list.   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   Migraine - remote; recently with frontal headaches only, resolve with  aleve quickly. She does have TMJ, has done flexeril PRN in the past which has been helpful  Depression - she has some stress r/t pandemic and kids, on wellbutrin 150 mg BID in recent months, unsure if this helped much, though has been doing better recently.   BMI is Body mass index is 36.11 kg/m., she has been working on diet and exercise, walks 1-2 days per week~25 min. She has been  on phentermine in the past and wanting to restart. Trying to eat more fruits and vegetables.  Wt Readings from Last 3 Encounters:  03/17/19 217 lb (98.4 kg)  02/06/19 208 lb 12.8 oz (94.7 kg)  12/07/18 230 lb (104.3 kg)   She has not been checking BP at home, does have cuff, today their BP is BP: 132/86 She does workout. She denies chest pain, shortness of breath, dizziness.   She is not on cholesterol medication. Her cholesterol is at goal. The cholesterol last visit was:   Lab Results  Component Value Date   CHOL 172 08/03/2016   HDL 70 08/03/2016   LDLCALC 82 08/03/2016   TRIG 100 08/03/2016   CHOLHDL 2.5 08/03/2016   She has been working on diet and exercise for glucose management. Last A1C in the office was:  Lab Results  Component Value Date   HGBA1C 5.1 08/03/2016   Last GFR: Lab Results  Component Value Date   GFRAA 115 02/06/2019   Patient is not currently on Vitamin D supplement but has in the past:    Lab Results  Component Value Date   VD25OH 29 (L) 08/03/2016     She has had mild LFT elevation, never hepatitis screen Lab Results  Component Value Date   ALT 38 (H) 02/06/2019   AST 33 (H) 02/06/2019   ALKPHOS 81 12/06/2018   BILITOT 0.2 02/06/2019     Current Medications:  Current Outpatient Medications on File Prior to Visit  Medication Sig Dispense Refill  . buPROPion (WELLBUTRIN XL) 150 MG 24 hr tablet Take 150 mg by mouth 2 (two) times daily.    . Prenatal Vit-Fe Fumarate-FA (PRENATAL VITAMIN) 27-0.8 MG TABS Take 1 tablet by mouth every morning. 100 tablet 3  . verapamil (CALAN-SR) 240 MG CR tablet Take 1 tablet (240 mg total) by mouth at bedtime. 90 tablet 1   No current facility-administered medications on file prior to visit.    Allergies:  Allergies  Allergen Reactions  . Adhesive [Tape]    Medical History:  She has Essential hypertension; Migraine; TMJ (temporomandibular joint syndrome); Morbid obesity ([place]); Depression; Vitamin D  deficiency; Liver enzyme elevation; and Status post cesarean delivery on their problem list. Health Maintenance:   There is no immunization history for the selected administration types on file for this patient.  Tetanus: 2020 Flu vaccine: 2020  LMP: Patient's last menstrual period was 02/25/2019. Pap: due next month, follows with GYN MGM: due age 31  DEXA: n/a  Colonoscopy: due age 52  EGD: n/a  Last Dental Exam: q59m, last2020 Last Eye Exam: wears glasses, last 2020   Patient Care Team: Unk Pinto, MD as PCP - General (Internal Medicine) Paula Compton, MD as Consulting Physician (Obstetrics and Gynecology)  Surgical History:  She has a past surgical history that includes Nasal septum surgery; deviated septum (2004); and Cesarean section (N/A, 12/06/2018). Family History:  Herfamily history includes Asthma in her sister and sister; CVA in her maternal grandfather; Cancer in her paternal grandmother; Diabetes in her maternal grandmother; Heart murmur  in her son; Hyperlipidemia in her father; Hypertension in her brother, maternal grandmother, and mother; Mental illness in her maternal grandmother; Other in her son. Social History:  She reports that she has never smoked. She has never used smokeless tobacco. She reports current alcohol use of about 1.0 standard drinks of alcohol per week. She reports that she does not use drugs.  Review of Systems: Review of Systems  Constitutional: Negative for malaise/fatigue and weight loss.  HENT: Negative for hearing loss and tinnitus.   Eyes: Negative for blurred vision and double vision.  Respiratory: Negative for cough, shortness of breath and wheezing.   Cardiovascular: Negative for chest pain, palpitations, orthopnea, claudication and leg swelling.  Gastrointestinal: Negative for abdominal pain, blood in stool, constipation, diarrhea, heartburn, melena, nausea and vomiting.  Genitourinary: Negative.   Musculoskeletal: Negative  for joint pain and myalgias.  Skin: Negative for rash.  Neurological: Positive for headaches. Negative for dizziness, tingling, sensory change and weakness.  Endo/Heme/Allergies: Negative for polydipsia.  Psychiatric/Behavioral: Negative for depression, substance abuse and suicidal ideas. The patient is nervous/anxious (mild, recently improved). The patient does not have insomnia.   All other systems reviewed and are negative.   Physical Exam: Estimated body mass index is 36.11 kg/m as calculated from the following:   Height as of this encounter: 5\' 5"  (1.651 m).   Weight as of this encounter: 217 lb (98.4 kg). BP 132/86   Pulse 65   Temp 97.7 F (36.5 C)   Ht 5\' 5"  (1.651 m)   Wt 217 lb (98.4 kg)   LMP 02/25/2019   SpO2 98%   BMI 36.11 kg/m  General Appearance: Well nourished, in no apparent distress.  Eyes: PERRLA, EOMs, conjunctiva no swelling or erythema Sinuses: No Frontal/maxillary tenderness  ENT/Mouth: Ext aud canals clear, normal light reflex with TMs without erythema, bulging. Good dentition. No erythema, swelling, or exudate on post pharynx. Tonsils not swollen or erythematous. Hearing normal.  Neck: Supple, thyroid normal. No bruits  Respiratory: Respiratory effort normal, BS equal bilaterally without rales, rhonchi, wheezing or stridor.  Cardio: RRR without murmurs, rubs or gallops. Brisk peripheral pulses without edema.  Chest: symmetric, with normal excursions and percussion.  Breasts: Defer to GYN Abdomen: Soft, nontender, no guarding, rebound, hernias, masses, or organomegaly.  Lymphatics: Non tender without lymphadenopathy.  Genitourinary: Defer to GYN  Musculoskeletal: Full ROM all peripheral extremities,5/5 strength, and normal gait.  Skin: Warm, dry without rashes, lesions, ecchymosis. Neuro: Cranial nerves intact, reflexes equal bilaterally. Normal muscle tone, no cerebellar symptoms. Sensation intact.  Psych: Awake and oriented X 3, normal affect, Insight  and Judgment appropriate.   EKG: WNL no ST changes.   Izora Ribas 3:31 PM [facility]

## 2019-03-17 ENCOUNTER — Other Ambulatory Visit: Payer: Self-pay

## 2019-03-17 ENCOUNTER — Ambulatory Visit: Payer: BC Managed Care – PPO | Admitting: Adult Health

## 2019-03-17 ENCOUNTER — Encounter: Payer: Self-pay | Admitting: Adult Health

## 2019-03-17 VITALS — BP 132/86 | HR 65 | Temp 97.7°F | Ht 65.0 in | Wt 217.0 lb

## 2019-03-17 DIAGNOSIS — Z79899 Other long term (current) drug therapy: Secondary | ICD-10-CM | POA: Diagnosis not present

## 2019-03-17 DIAGNOSIS — Z98891 History of uterine scar from previous surgery: Secondary | ICD-10-CM

## 2019-03-17 DIAGNOSIS — G43809 Other migraine, not intractable, without status migrainosus: Secondary | ICD-10-CM

## 2019-03-17 DIAGNOSIS — M26609 Unspecified temporomandibular joint disorder, unspecified side: Secondary | ICD-10-CM

## 2019-03-17 DIAGNOSIS — Z13220 Encounter for screening for lipoid disorders: Secondary | ICD-10-CM | POA: Diagnosis not present

## 2019-03-17 DIAGNOSIS — F329 Major depressive disorder, single episode, unspecified: Secondary | ICD-10-CM

## 2019-03-17 DIAGNOSIS — I10 Essential (primary) hypertension: Secondary | ICD-10-CM | POA: Diagnosis not present

## 2019-03-17 DIAGNOSIS — Z1329 Encounter for screening for other suspected endocrine disorder: Secondary | ICD-10-CM | POA: Diagnosis not present

## 2019-03-17 DIAGNOSIS — E559 Vitamin D deficiency, unspecified: Secondary | ICD-10-CM | POA: Diagnosis not present

## 2019-03-17 DIAGNOSIS — Z131 Encounter for screening for diabetes mellitus: Secondary | ICD-10-CM

## 2019-03-17 DIAGNOSIS — F32A Depression, unspecified: Secondary | ICD-10-CM

## 2019-03-17 DIAGNOSIS — Z1389 Encounter for screening for other disorder: Secondary | ICD-10-CM | POA: Diagnosis not present

## 2019-03-17 DIAGNOSIS — Z0000 Encounter for general adult medical examination without abnormal findings: Secondary | ICD-10-CM | POA: Diagnosis not present

## 2019-03-17 DIAGNOSIS — Z136 Encounter for screening for cardiovascular disorders: Secondary | ICD-10-CM

## 2019-03-17 DIAGNOSIS — R748 Abnormal levels of other serum enzymes: Secondary | ICD-10-CM

## 2019-03-17 DIAGNOSIS — Z0001 Encounter for general adult medical examination with abnormal findings: Secondary | ICD-10-CM

## 2019-03-17 MED ORDER — PHENTERMINE HCL 37.5 MG PO TABS: ORAL_TABLET | ORAL | 0 refills | Status: DC

## 2019-03-17 NOTE — Patient Instructions (Signed)
[name] , Thank you for taking time to come for your Annual Wellness Visit. I appreciate your ongoing commitment to your health goals. Please review the following plan we discussed and let me know if I can assist you in the future.   These are the goals we discussed: Goals    . Blood Pressure < 130/80    . Weight (lb) < 200 lb (90.7 kg)       This is a list of the screening recommended for you and due dates:  Health Maintenance  Topic Date Due  . Pap Smear  03/11/2018  . Tetanus Vaccine  09/29/2028  . Flu Shot  Completed  . HIV Screening  Completed     Know what a healthy weight is for you (roughly BMI <25) and aim to maintain this  Aim for 7+ servings of fruits and vegetables daily  65-80+ fluid ounces of water or unsweet tea for healthy kidneys  Limit to max 1 drink of alcohol per day; avoid smoking/tobacco  Limit animal fats in diet for cholesterol and heart health - choose grass fed whenever available  Avoid highly processed foods, and foods high in saturated/trans fats  Aim for low stress - take time to unwind and care for your mental health  Aim for 150 min of moderate intensity exercise weekly for heart health, and weights twice weekly for bone health  Aim for 7-9 hours of sleep daily        Drink 1/2 your body weight in fluid ounces of water daily; drink a tall glass of water 30 min before meals  Don't eat until you're stuffed- listen to your stomach and eat until you are 80% full   Try eating off of a salad plate; wait 10 min after finishing before going back for seconds  Start by eating the vegetables on your plate; aim for 50% of your meals to be fruits or vegetables  Then eat your protein - lean meats (grass fed if possible), fish, beans, nuts in moderation  Eat your carbs/starch last ONLY if you still are hungry. If you can, stop before finishing it all  Avoid sugar and flour - the closer it looks to it's original form in nature, typically the  better it is for you  Splurge in moderation - "assign" days when you get to splurge and have the "bad stuff" - I like to follow a 80% - 20% plan- "good" choices 80 % of the time, "bad" choices in moderation 20% of the time  Simple equation is: Calories out > calories in = weight loss - even if you eat the bad stuff, if you limit portions, you will still lose weight      Phentermine tablets or capsules What is this medicine? PHENTERMINE (FEN ter meen) decreases your appetite. It is used with a reduced calorie diet and exercise to help you lose weight. This medicine may be used for other purposes; ask your health care provider or pharmacist if you have questions. COMMON BRAND NAME(S): Adipex-P, Atti-Plex P, Atti-Plex P Spansule, Fastin, Lomaira, Pro-Fast, Tara-8 What should I tell my health care provider before I take this medicine? They need to know if you have any of these conditions:  agitation or nervousness  diabetes  glaucoma  heart disease  high blood pressure  history of drug abuse or addiction  history of stroke  kidney disease  lung disease called Primary Pulmonary Hypertension (PPH)  taken an MAOI like Carbex, Eldepryl, Marplan, Nardil, or  Parnate in last 14 days  taking stimulant medicines for attention disorders, weight loss, or to stay awake  thyroid disease  an unusual or allergic reaction to phentermine, other medicines, foods, dyes, or preservatives  pregnant or trying to get pregnant  breast-feeding How should I use this medicine? Take this medicine by mouth with a glass of water. Follow the directions on the prescription label. The instructions for use may differ based on the product and dose you are taking. Avoid taking this medicine in the evening. It may interfere with sleep. Take your doses at regular intervals. Do not take your medicine more often than directed. Talk to your pediatrician regarding the use of this medicine in children. While this  drug may be prescribed for children 17 years or older for selected conditions, precautions do apply. Overdosage: If you think you have taken too much of this medicine contact a poison control center or emergency room at once. NOTE: This medicine is only for you. Do not share this medicine with others. What if I miss a dose? If you miss a dose, take it as soon as you can. If it is almost time for your next dose, take only that dose. Do not take double or extra doses. What may interact with this medicine? Do not take this medicine with any of the following medications:  MAOIs like Carbex, Eldepryl, Marplan, Nardil, and Parnate  medicines for colds or breathing difficulties like pseudoephedrine or phenylephrine  procarbazine  sibutramine  stimulant medicines for attention disorders, weight loss, or to stay awake This medicine may also interact with the following medications:  certain medicines for depression, anxiety, or psychotic disturbances  linezolid  medicines for diabetes  medicines for high blood pressure This list may not describe all possible interactions. Give your health care provider a list of all the medicines, herbs, non-prescription drugs, or dietary supplements you use. Also tell them if you smoke, drink alcohol, or use illegal drugs. Some items may interact with your medicine. What should I watch for while using this medicine? Notify your physician immediately if you become short of breath while doing your normal activities. Do not take this medicine within 6 hours of bedtime. It can keep you from getting to sleep. Avoid drinks that contain caffeine and try to stick to a regular bedtime every night. This medicine was intended to be used in addition to a healthy diet and exercise. The best results are achieved this way. This medicine is only indicated for short-term use. Eventually your weight loss may level out. At that point, the drug will only help you maintain your new  weight. Do not increase or in any way change your dose without consulting your doctor. You may get drowsy or dizzy. Do not drive, use machinery, or do anything that needs mental alertness until you know how this medicine affects you. Do not stand or sit up quickly, especially if you are an older patient. This reduces the risk of dizzy or fainting spells. Alcohol may increase dizziness and drowsiness. Avoid alcoholic drinks. What side effects may I notice from receiving this medicine? Side effects that you should report to your doctor or health care professional as soon as possible:  allergic reactions like skin rash, itching or hives, swelling of the face, lips, or tongue)  anxiety  breathing problems  changes in vision  chest pain or chest tightness  depressed mood or other mood changes  hallucinations, loss of contact with reality  fast, irregular heartbeat  increased blood pressure  irritable  nervousness or restlessness  painful urination  palpitations  tremors  trouble sleeping  seizures  signs and symptoms of a stroke like changes in vision; confusion; trouble speaking or understanding; severe headaches; sudden numbness or weakness of the face, arm or leg; trouble walking; dizziness; loss of balance or coordination  unusually weak or tired  vomiting Side effects that usually do not require medical attention (report to your doctor or health care professional if they continue or are bothersome):  constipation or diarrhea  dry mouth  headache  nausea  stomach upset  sweating This list may not describe all possible side effects. Call your doctor for medical advice about side effects. You may report side effects to FDA at 1-800-FDA-1088. Where should I keep my medicine? Keep out of the reach of children. This medicine can be abused. Keep your medicine in a safe place to protect it from theft. Do not share this medicine with anyone. Selling or giving away this  medicine is dangerous and against the law. This medicine may cause accidental overdose and death if taken by other adults, children, or pets. Mix any unused medicine with a substance like cat litter or coffee grounds. Then throw the medicine away in a sealed container like a sealed bag or a coffee can with a lid. Do not use the medicine after the expiration date. Store at room temperature between 20 and 25 degrees C (68 and 77 degrees F). Keep container tightly closed. NOTE: This sheet is a summary. It may not cover all possible information. If you have questions about this medicine, talk to your doctor, pharmacist, or health care provider.  2020 Elsevier/Gold Standard (2016-09-22 08:23:13)     SMART goals Having a solid fitness goal is an amazing way to power you towards success, but not all goals are created equal. While it's great to have an end-game in mind, there are some best practices when it comes to goal setting. Whether you want to lose weight, improve your fitness level, or train for an event, putting the SMART method into action can help you achieve what you set out to do.  SMART stands for specific, measurable, attainable, relevant, and timely-all of which are important in reaching a fitness objective. SMART goals can help keep you on track and remind you of your priorities, so you're able to follow through with every workout or healthy meal you have planned.   Get SMART and put these five elements into action when you're setting your fitness goal.  1. Specific  You need something that's not too arbitrary. For example, a bad goal would be, say, 'get healthy'. A specific goal would be to lose weight. You'll narrow down that goal even further by using the rest of the method, but whether you want to get stronger, faster, or smaller, having a baseline points you in the right direction.  2. Measurable  Here's where you determine exactly how you'll measure your goal. If you're going to  follow the bad goal, it would be get really healthy.That's not quantifiable. A measurable goal would be, say, 'lose 10 pounds'. You can quantify your progress, and you can sort of back into a time frame once you have that. Your goal may be to master a pull-up, run five miles, or go to the gym four days a week-whatever it is, you should have a definite way of knowing when you've reached your goal.  3. Attainable  While it can be helpful  to set big-picture goals in the long-term, you need a more achievable goal on the horizon to keep you on track. You want to start small and see early wins, which encourages long-term consistency.  If you set something too lofty right off the bat, it might be discouraging to not make progress as fast as you would like. You should also consider the size of your goal-for example, a goal of losing 30 pounds in one month just isn't going to happen, so you're better off setting smaller goals that are in closer reach.  4. Relevant  This is where things get a little tricky, finding your "why" is easier said than done. Ask yourself, 'is this goal worthwhile, and am I motivated to do it?' Creating a goal with some type of motivation attached to it, like I want to lose 10 pounds in two months to be ready for my wedding, can give a bit of relevancy to your goal. Whether you want to feel confident at a big event or perform better during everyday activities, pinpoint why a goal is important to you.  5. Timely  You want to be strict about a deadline-doing so creates urgency. It's also important not to set your sights too far out. If you give yourself four months to lose 10 pounds, that might be too long because you aren't incentivized to start working at it immediately. Instead, consider setting smaller goals along the way, like "I want to lose three pounds in two weeks." Maybe running a marathon is your long-term goal, but if you've never been a runner, signing up for one that's a month  away isn't realistic-instead, set smaller mileage goals for shorter time periods and work your way up.  You should also be honest with yourself about what you're able to accomplish in a given time frame. You just need to adjust your expectations so they're in line with your schedule and commitments.  Once you have your goal in place, it's all about the follow-through. Whether you want to lose one pound a week, be able to do five full push-ups in two weeks, or run a 5K in under 30 minutes in four weeks, you can come up with a plan to help get your where you want to go-but it all starts with deciding what you want. Be accountable to yourself, stay consistent, and the results will follow.  So try the exercise at home and at your next visit come in with your SMART goal and we can discuss how together we can achieve this goal!

## 2019-03-18 ENCOUNTER — Other Ambulatory Visit: Payer: Self-pay | Admitting: Adult Health

## 2019-03-18 DIAGNOSIS — R748 Abnormal levels of other serum enzymes: Secondary | ICD-10-CM

## 2019-03-18 LAB — URINALYSIS, ROUTINE W REFLEX MICROSCOPIC
Bilirubin Urine: NEGATIVE
Glucose, UA: NEGATIVE
Hgb urine dipstick: NEGATIVE
Hyaline Cast: NONE SEEN /LPF
Ketones, ur: NEGATIVE
Nitrite: NEGATIVE
Protein, ur: NEGATIVE
RBC / HPF: NONE SEEN /HPF (ref 0–2)
Specific Gravity, Urine: 1.013 (ref 1.001–1.03)
pH: 6 (ref 5.0–8.0)

## 2019-03-18 LAB — COMPLETE METABOLIC PANEL WITH GFR
AG Ratio: 1.4 (calc) (ref 1.0–2.5)
ALT: 34 U/L — ABNORMAL HIGH (ref 6–29)
AST: 21 U/L (ref 10–30)
Albumin: 4.3 g/dL (ref 3.6–5.1)
Alkaline phosphatase (APISO): 46 U/L (ref 31–125)
BUN: 9 mg/dL (ref 7–25)
CO2: 23 mmol/L (ref 20–32)
Calcium: 10.1 mg/dL (ref 8.6–10.2)
Chloride: 104 mmol/L (ref 98–110)
Creat: 0.76 mg/dL (ref 0.50–1.10)
GFR, Est African American: 117 mL/min/{1.73_m2} (ref >=60)
GFR, Est Non African American: 101 mL/min/{1.73_m2} (ref >=60)
Globulin: 3.1 g/dL (calc) (ref 1.9–3.7)
Glucose, Bld: 90 mg/dL (ref 65–99)
Potassium: 4.1 mmol/L (ref 3.5–5.3)
Sodium: 138 mmol/L (ref 135–146)
Total Bilirubin: 0.2 mg/dL (ref 0.2–1.2)
Total Protein: 7.4 g/dL (ref 6.1–8.1)

## 2019-03-18 LAB — CBC WITH DIFFERENTIAL/PLATELET
Absolute Monocytes: 819 cells/uL (ref 200–950)
Basophils Absolute: 74 cells/uL (ref 0–200)
Basophils Relative: 0.7 %
Eosinophils Absolute: 168 cells/uL (ref 15–500)
Eosinophils Relative: 1.6 %
HCT: 39.8 % (ref 35.0–45.0)
Hemoglobin: 13.6 g/dL (ref 11.7–15.5)
Lymphs Abs: 3738 cells/uL (ref 850–3900)
MCH: 30.6 pg (ref 27.0–33.0)
MCHC: 34.2 g/dL (ref 32.0–36.0)
MCV: 89.6 fL (ref 80.0–100.0)
MPV: 11.5 fL (ref 7.5–12.5)
Monocytes Relative: 7.8 %
Neutro Abs: 5702 cells/uL (ref 1500–7800)
Neutrophils Relative %: 54.3 %
Platelets: 275 10*3/uL (ref 140–400)
RBC: 4.44 10*6/uL (ref 3.80–5.10)
RDW: 12.4 % (ref 11.0–15.0)
Total Lymphocyte: 35.6 %
WBC: 10.5 10*3/uL (ref 3.8–10.8)

## 2019-03-18 LAB — LIPID PANEL
Cholesterol: 178 mg/dL (ref <200)
HDL: 64 mg/dL (ref >=50)
LDL Cholesterol (Calc): 87 mg/dL (calc)
Non-HDL Cholesterol (Calc): 114 mg/dL (calc) (ref <130)
Total CHOL/HDL Ratio: 2.8 (calc) (ref <5.0)
Triglycerides: 169 mg/dL — ABNORMAL HIGH (ref <150)

## 2019-03-18 LAB — HEPATITIS PANEL, ACUTE
Hep A IgM: NONREACTIVE
Hep B C IgM: NONREACTIVE
Hepatitis B Surface Ag: NONREACTIVE
Hepatitis C Ab: NONREACTIVE
SIGNAL TO CUT-OFF: 0.03 (ref <1.00)

## 2019-03-18 LAB — VITAMIN D 25 HYDROXY (VIT D DEFICIENCY, FRACTURES): Vit D, 25-Hydroxy: 14 ng/mL — ABNORMAL LOW (ref 30–100)

## 2019-03-18 LAB — MICROALBUMIN / CREATININE URINE RATIO
Creatinine, Urine: 70 mg/dL (ref 20–275)
Microalb Creat Ratio: 10 mcg/mg creat (ref <30)
Microalb, Ur: 0.7 mg/dL

## 2019-03-18 LAB — HEMOGLOBIN A1C
Hgb A1c MFr Bld: 5.4 % of total Hgb (ref <5.7)
Mean Plasma Glucose: 108 (calc)
eAG (mmol/L): 6 (calc)

## 2019-03-18 LAB — TSH: TSH: 1.69 mIU/L

## 2019-03-18 LAB — MAGNESIUM: Magnesium: 1.5 mg/dL (ref 1.5–2.5)

## 2019-04-01 ENCOUNTER — Ambulatory Visit
Admission: RE | Admit: 2019-04-01 | Discharge: 2019-04-01 | Disposition: A | Discharge status: Home / Self Care | Payer: BC Managed Care – PPO | Source: Ambulatory Visit | Attending: Adult Health | Admitting: Adult Health

## 2019-04-01 DIAGNOSIS — K8020 Calculus of gallbladder without cholecystitis without obstruction: Secondary | ICD-10-CM | POA: Diagnosis not present

## 2019-04-01 DIAGNOSIS — R748 Abnormal levels of other serum enzymes: Secondary | ICD-10-CM

## 2019-04-25 DIAGNOSIS — K8020 Calculus of gallbladder without cholecystitis without obstruction: Secondary | ICD-10-CM

## 2019-04-25 HISTORY — DX: Calculus of gallbladder without cholecystitis without obstruction: K80.20

## 2019-04-25 NOTE — Progress Notes (Signed)
Assessment and Plan:  Kimberly Howard was seen today for follow-up.  Diagnoses and all orders for this visit:  Morbid obesity ([place]) Phentermine works but discussed may need to work on consistency and better choices prior to appetite med Suggested she keep a journal to set and track goals and progress Aim for 3 meals daily - try to include fiber/protein/healthy fat with each - examples reviewed, meal planning Increase water - 80+ fluid ounces daily, set timer if needed Set exercise goals - gradually work up to 150 min/week Set 1-2 goals per week to work on, review weekly  Weigh weekly - goal 0.5-2 lb/week Hold off on phentermine unless APPETITE is a sig limitation;  Follow up in 6-8 weeks   Essential hypertension Continue medication Monitor blood pressure at home; call if consistently over 130/80 Continue DASH diet.   Reminder to go to the ER if any CP, SOB, nausea, dizziness, severe HA, changes vision/speech, left arm numbness and tingling and jaw pain.  Calculus of gallbladder without cholecystitis without obstruction Monitor; will need lab chole at some point  Liver enzyme elevation Monitor; avoid tylenol, alcohol, weight loss advised.   Further disposition pending results of labs. Discussed med's effects and SE's.   Over 30 minutes of exam, counseling, chart review, and critical decision making was performed.   Future Appointments  Date Time Provider [place]  03/16/2020  2:00 PM Liane Comber, NP [facility] None    ------------------------------------------------------------------------------------------------------------------   HPI 39 y.o.female presents for 1 month follow up after initiation of phentermine.  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   She has had mild persistent LFT elevation, had negative hepatitis panel 03/17/2019, abd US showed normal liver/bile duct but with numerous cholelithiasis ~35m without appearance of cholecystitis on 04/01/2019. Will likely require cholecystectomy at some point.   Lab Results  Component Value Date   ALT 34 (H) 03/17/2019   AST 21 03/17/2019   ALKPHOS 81 12/06/2018   BILITOT 0.2 03/17/2019   Her blood pressure has been controlled at home, today their BP is BP: 130/82  She does not workout. She denies chest pain, shortness of breath, dizziness.   she is prescribed phentermine for weight loss. She reports has been taking very irregularly. Took daily for 1 week, very suppressed appetite, very dry mouth. Stopped for several weeks, has taken 1/2 tab irregularly.  While on the medication they have lost 0 lbs since last visit. They deny palpitations, anxiety, trouble sleeping, elevated BP.   BMI is Body mass index is 36.28 kg/m. Not particularly working on diet/weigh, very irregular Wt Readings from Last 3 Encounters:  04/28/19 218 lb (98.9 kg)  03/17/19 217 lb (98.4 kg)  02/06/19 208 lb 12.8 oz (94.7 kg)   Typical breakfast: Mon-Fri, 12 oz iced coffee with splash of creamer, greek yogurt Typical lunch: Varies - [employer] mac and cheese or salad Snack: apple or banana  Typical dinner: protein - pork chop,  Exercise: minimal  Water intake: 1- 1.5 cups, ?  36 fluid ounces  Alcohol 1-2 glasses per month  Sleep: irregular due to infant     Past Medical History:  Diagnosis Date  . Anxiety   . Depression   . Fibroid   . History of pre-eclampsia   . Hypertension 2015    Preeclapsia with child, continued HTN  . Migraines   . Preeclampsia 05/28/2013  . Status post cesarean delivery 12/07/2018     Allergies  Allergen Reactions  . Adhesive [Tape]     Current Outpatient Medications on File Prior to Visit  Medication Sig  . Cholecalciferol (VITAMIN D) 125 MCG (5000 UT) CAPS Take by mouth daily.  . Multiple Vitamins-Minerals (MULTIVITAMIN ADULT PO) Take by mouth daily.  . phentermine (ADIPEX-P) 37.5 MG tablet Take 1/2 to 1 tablet every morning as needed for dieting & weight loss. Take regular drug breaks.  . verapamil (CALAN-SR) 240 MG CR tablet Take 1 tablet (240 mg total) by mouth at bedtime.   No current facility-administered medications on file prior to visit.    ROS: all negative except above.   Physical Exam:  BP 130/82   Pulse 91   Temp (!) 97.5 F (36.4 C)   Wt 218 lb (98.9 kg)   SpO2 98%   BMI 36.28 kg/m   General Appearance: Well nourished,obese AA female in no apparent distress. Eyes: conjunctiva no swelling or erythema Sinuses: ENT/Mouth: Mask in place; Hearing normal.  Neck: Supple, thyroid normal.  Respiratory: Respiratory effort normal, BS equal bilaterally without rales, rhonchi, wheezing or stridor.  Cardio: RRR with no MRGs. Brisk peripheral pulses without edema.  Abdomen: Soft, + BS.  Non tender. Lymphatics: Non tender without lymphadenopathy.  Musculoskeletal: No obvious deformity, normal gait.  Skin: Warm, dry without rashes, lesions, ecchymosis.  Neuro: Normal muscle tone, no cerebellar symptoms.  Psych: Awake and oriented X 3, normal affect, Insight and Judgment appropriate.     Izora Ribas, NP 5:45 PM [facility]

## 2019-04-28 ENCOUNTER — Encounter: Payer: Self-pay | Admitting: Adult Health

## 2019-04-28 ENCOUNTER — Other Ambulatory Visit: Payer: Self-pay

## 2019-04-28 ENCOUNTER — Ambulatory Visit: Payer: BC Managed Care – PPO | Admitting: Adult Health

## 2019-04-28 DIAGNOSIS — K8020 Calculus of gallbladder without cholecystitis without obstruction: Secondary | ICD-10-CM | POA: Diagnosis not present

## 2019-04-28 DIAGNOSIS — R748 Abnormal levels of other serum enzymes: Secondary | ICD-10-CM

## 2019-04-28 DIAGNOSIS — I10 Essential (primary) hypertension: Secondary | ICD-10-CM | POA: Diagnosis not present

## 2019-04-28 NOTE — Patient Instructions (Addendum)
Goals    . Blood Pressure < 130/80    . DIET - INCREASE WATER INTAKE     80+ fluid ounces daily     . Exercise 150 min/wk Moderate Activity    . Weight (lb) < 200 lb (90.7 kg)     Keep journal to track habits and progress Aim for 0.5-2 lb / week  Aim to make small sustainable changes       Try to get through 3-4 of 24 fluid ounce   Try to get 80+ fluid ounces of water daily   Keep a food journal - plan on Sat/Sun do all meal plans, buying make sure you have everything you need  Set a goal or two  Water intake  No skipping meals  Getting protein+fiber+ healthy fat at each meal   Increase veggies   Etc, set easy goals each week to work on and review how you did  Aim to work up to 30 min of exercise 5 days a week    SMALL CHANGES  We want weight loss that will last so you should lose 1-2 pounds a week.  THAT IS IT! Please 1`-2 things to work on off of this list or others to work on each week. Can repeat if needing more improvement. Once it is a habit check off the item. Then pick another 1-2 items off the list to become habits.  If you are already doing a habit on the list GREAT!  Cross that item off! o Don't drink your calories. Ie, alcohol, soda, fruit juice, and sweet tea.  o Drink more water. Drink a glass when you feel hungry or before each meal.  o Eat breakfast - Complex carb and protein (likeDannon light and fit yogurt, oatmeal, fruit, eggs, [place] bacon). o Measure your cereal.  Eat no more than one cup a day. (ie [place]) o Eat an apple a day. o Add a vegetable a day. o Try a new vegetable a month. o Use Pam! Stop using oil or butter to cook. o Don't finish your plate or use smaller plates. o Share your dessert. o Eat sugar free Jello for dessert or frozen grapes. o Don't eat 2-3 hours before bed. o Switch to whole wheat bread, pasta, and brown rice. o Make healthier choices when you eat out. No fries! o Pick baked chicken, NOT fried. o Don't forget to SLOW  DOWN when you eat. It is not going anywhere.  o Take the stairs. o Park far away in the parking lot o [employer] (or weights) for 10 minutes while watching TV. o Walk at work for 10 minutes during break. o Walk outside 1 time a week with your friend, kids, dog, or significant other. o Start a walking group at [place] the mall as much as you can tolerate.  o Keep a food diary. o Weigh yourself daily. o Walk for 15 minutes 3 days per week. o Cook at home more often and eat out less.  If life happens and you go back to old habits, it is okay.  Just start over. You can do it!       SMART goals Having a solid fitness goal is an amazing way to power you towards success, but not all goals are created equal. While it's great to have an end-game in mind, there are some best practices when it comes to goal setting. Whether you want to lose weight, improve your fitness level, or train  for an event, putting the SMART method into action can help you achieve what you set out to do.  SMART stands for specific, measurable, attainable, relevant, and timely--all of which are important in reaching a fitness objective. SMART goals can help keep you on track and remind you of your priorities, so you're able to follow through with every workout or healthy meal you have planned.   Get SMART and put these five elements into action when you're setting your fitness goal.  1. Specific  You need something that's not too arbitrary. For example, a bad goal would be, say, 'get healthy'. A specific goal would be to lose weight. You'll narrow down that goal even further by using the rest of the method, but whether you want to get stronger, faster, or smaller, having a baseline points you in the right direction.  2. Measurable  Here's where you determine exactly how you'll measure your goal. If you're going to follow the bad goal, it would be get really healthy.That's not quantifiable. A measurable goal  would be, say, 'lose 10 pounds'. You can quantify your progress, and you can sort of back into a time frame once you have that. Your goal may be to master a pull-up, run five miles, or go to the gym four days a week--whatever it is, you should have a definite way of knowing when you've reached your goal.  3. Attainable  While it can be helpful to set big-picture goals in the long-term, you need a more achievable goal on the horizon to keep you on track. You want to start small and see early wins, which encourages long-term consistency.  If you set something too lofty right off the bat, it might be discouraging to not make progress as fast as you would like. You should also consider the size of your goal--for example, a goal of losing 30 pounds in one month just isn't going to happen, so you're better off setting smaller goals that are in closer reach.  4. Relevant  This is where things get a little tricky, finding your "why" is easier said than done. Ask yourself, 'is this goal worthwhile, and am I motivated to do it?' Creating a goal with some type of motivation attached to it, like I want to lose 10 pounds in two months to be ready for my wedding, can give a bit of relevancy to your goal. Whether you want to feel confident at a big event or perform better during everyday activities, pinpoint why a goal is important to you.  5. Timely  You want to be strict about a deadline--doing so creates urgency. It's also important not to set your sights too far out. If you give yourself four months to lose 10 pounds, that might be too long because you aren't incentivized to start working at it immediately. Instead, consider setting smaller goals along the way, like "I want to lose three pounds in two weeks." Maybe running a marathon is your long-term goal, but if you've never been a runner, signing up for one that's a month away isn't realistic--instead, set smaller mileage goals for shorter time periods and work  your way up.  You should also be honest with yourself about what you're able to accomplish in a given time frame. You just need to adjust your expectations so they're in line with your schedule and commitments.  Once you have your goal in place, it's all about the follow-through. Whether you want to lose one pound  a week, be able to do five full push-ups in two weeks, or run a 5K in under 30 minutes in four weeks, you can come up with a plan to help get your where you want to go--but it all starts with deciding what you want. Be accountable to yourself, stay consistent, and the results will follow.  So try the exercise at home and at your next visit come in with your SMART goal and we can discuss how together we can achieve this goal!

## 2019-05-28 ENCOUNTER — Other Ambulatory Visit: Payer: Self-pay | Admitting: Internal Medicine

## 2019-05-28 DIAGNOSIS — Z6835 Body mass index (BMI) 35.0-35.9, adult: Secondary | ICD-10-CM

## 2019-05-28 MED ORDER — PHENTERMINE HCL 37.5 MG PO TABS: ORAL_TABLET | ORAL | 1 refills | Status: DC

## 2019-05-28 MED ORDER — TOPIRAMATE 50 MG PO TABS: ORAL_TABLET | ORAL | 1 refills | Status: DC

## 2019-06-26 ENCOUNTER — Encounter: Payer: Self-pay | Admitting: Adult Health

## 2019-06-26 ENCOUNTER — Other Ambulatory Visit: Payer: Self-pay

## 2019-06-26 ENCOUNTER — Ambulatory Visit: Payer: PRIVATE HEALTH INSURANCE | Admitting: Adult Health

## 2019-06-26 VITALS — BP 124/88 | HR 105 | Temp 97.3°F | Wt 211.6 lb

## 2019-06-26 DIAGNOSIS — G43809 Other migraine, not intractable, without status migrainosus: Secondary | ICD-10-CM | POA: Diagnosis not present

## 2019-06-26 DIAGNOSIS — K8020 Calculus of gallbladder without cholecystitis without obstruction: Secondary | ICD-10-CM | POA: Diagnosis not present

## 2019-06-26 DIAGNOSIS — I10 Essential (primary) hypertension: Secondary | ICD-10-CM

## 2019-06-26 MED ORDER — ESCITALOPRAM OXALATE 20 MG PO TABS: 20.0000 mg | ORAL_TABLET | Freq: Every day | ORAL | 0 refills | Status: DC

## 2019-06-26 NOTE — Patient Instructions (Addendum)
Goals    . Blood Pressure < 130/80    . DIET - INCREASE WATER INTAKE     80+ fluid ounces daily     . Exercise 150 min/wk Moderate Activity    . Weight (lb) < 200 lb (90.7 kg)     Keep journal to track habits and progress Aim for 0.5-2 lb / week  Aim to make small sustainable changes        Will note benefit at week 8-12      Escitalopram tablets What is this medicine? ESCITALOPRAM (es sye TAL oh pram) is used to treat depression and certain types of anxiety. This medicine may be used for other purposes; ask your health care provider or pharmacist if you have questions. COMMON BRAND NAME(S): Lexapro What should I tell my health care provider before I take this medicine? They need to know if you have any of these conditions:  bipolar disorder or a family history of bipolar disorder  diabetes  glaucoma  heart disease  kidney or liver disease  receiving electroconvulsive therapy  seizures (convulsions)  suicidal thoughts, plans, or attempt by you or a family member  an unusual or allergic reaction to escitalopram, the related drug citalopram, other medicines, foods, dyes, or preservatives  pregnant or trying to become pregnant  breast-feeding How should I use this medicine? Take this medicine by mouth with a glass of water. Follow the directions on the prescription label. You can take it with or without food. If it upsets your stomach, take it with food. Take your medicine at regular intervals. Do not take it more often than directed. Do not stop taking this medicine suddenly except upon the advice of your doctor. Stopping this medicine too quickly may cause serious side effects or your condition may worsen. A special MedGuide will be given to you by the pharmacist with each prescription and refill. Be sure to read this information carefully each time. Talk to your pediatrician regarding the use of this medicine in children. Special care may be needed. Overdosage: If  you think you have taken too much of this medicine contact a poison control center or emergency room at once. NOTE: This medicine is only for you. Do not share this medicine with others. What if I miss a dose? If you miss a dose, take it as soon as you can. If it is almost time for your next dose, take only that dose. Do not take double or extra doses. What may interact with this medicine? Do not take this medicine with any of the following medications:  certain medicines for fungal infections like fluconazole, itraconazole, ketoconazole, posaconazole, voriconazole  cisapride  citalopram  dronedarone  linezolid  MAOIs like Carbex, Eldepryl, Marplan, Nardil, and Parnate  methylene blue (injected into a vein)  pimozide  thioridazine This medicine may also interact with the following medications:  alcohol  amphetamines  aspirin and aspirin-like medicines  carbamazepine  certain medicines for depression, anxiety, or psychotic disturbances  certain medicines for migraine headache like almotriptan, eletriptan, frovatriptan, naratriptan, rizatriptan, sumatriptan, zolmitriptan  certain medicines for sleep  certain medicines that treat or prevent blood clots like warfarin, enoxaparin, dalteparin  cimetidine  diuretics  dofetilide  fentanyl  furazolidone  isoniazid  lithium  metoprolol  NSAIDs, medicines for pain and inflammation, like ibuprofen or naproxen  other medicines that prolong the QT interval (cause an abnormal heart rhythm)  procarbazine  rasagiline  supplements like St. John's wort, kava kava, valerian  tramadol  tryptophan  ziprasidone This list may not describe all possible interactions. Give your health care provider a list of all the medicines, herbs, non-prescription drugs, or dietary supplements you use. Also tell them if you smoke, drink alcohol, or use illegal drugs. Some items may interact with your medicine. What should I watch for  while using this medicine? Tell your doctor if your symptoms do not get better or if they get worse. Visit your doctor or health care professional for regular checks on your progress. Because it may take several weeks to see the full effects of this medicine, it is important to continue your treatment as prescribed by your doctor. Patients and their families should watch out for new or worsening thoughts of suicide or depression. Also watch out for sudden changes in feelings such as feeling anxious, agitated, panicky, irritable, hostile, aggressive, impulsive, severely restless, overly excited and hyperactive, or not being able to sleep. If this happens, especially at the beginning of treatment or after a change in dose, call your health care professional. Dennis Bast may get drowsy or dizzy. Do not drive, use machinery, or do anything that needs mental alertness until you know how this medicine affects you. Do not stand or sit up quickly, especially if you are an older patient. This reduces the risk of dizzy or fainting spells. Alcohol may interfere with the effect of this medicine. Avoid alcoholic drinks. Your mouth may get dry. Chewing sugarless gum or sucking hard candy, and drinking plenty of water may help. Contact your doctor if the problem does not go away or is severe. What side effects may I notice from receiving this medicine? Side effects that you should report to your doctor or health care professional as soon as possible:  allergic reactions like skin rash, itching or hives, swelling of the face, lips, or tongue  anxious  black, tarry stools  changes in vision  confusion  elevated mood, decreased need for sleep, racing thoughts, impulsive behavior  eye pain  fast, irregular heartbeat  feeling faint or lightheaded, falls  feeling agitated, angry, or irritable  hallucination, loss of contact with reality  loss of balance or coordination  loss of memory  painful or prolonged  erections  restlessness, pacing, inability to keep still  seizures  stiff muscles  suicidal thoughts or other mood changes  trouble sleeping  unusual bleeding or bruising  unusually weak or tired  vomiting Side effects that usually do not require medical attention (report to your doctor or health care professional if they continue or are bothersome):  changes in appetite  change in sex drive or performance  headache  increased sweating  indigestion, nausea  tremors This list may not describe all possible side effects. Call your doctor for medical advice about side effects. You may report side effects to FDA at 1-800-FDA-1088. Where should I keep my medicine? Keep out of reach of children. Store at room temperature between 15 and 30 degrees C (59 and 86 degrees F). Throw away any unused medicine after the expiration date. NOTE: This sheet is a summary. It may not cover all possible information. If you have questions about this medicine, talk to your doctor, pharmacist, or health care provider.  2020 Elsevier/Gold Standard (2018-04-01 11:21:44)    Cholelithiasis  Cholelithiasis is a form of gallbladder disease in which gallstones form in the gallbladder. The gallbladder is an organ that stores bile. Bile is made in the liver, and it helps to digest fats. Gallstones begin as small crystals  and slowly grow into stones. They may cause no symptoms until the gallbladder tightens (contracts) and a gallstone is blocking the duct (gallbladder attack), which can cause pain. Cholelithiasis is also referred to as gallstones. There are two main types of gallstones:  Cholesterol stones. These are made of hardened cholesterol and are usually yellow-green in color. They are the most common type of gallstone. Cholesterol is a white, waxy, fat-like substance that is made in the liver.  Pigment stones. These are dark in color and are made of a red-yellow substance that forms when  hemoglobin from red blood cells breaks down (bilirubin). What are the causes? This condition may be caused by an imbalance in the substances that bile is made of. This can happen if the bile:  Has too much bilirubin.  Has too much cholesterol.  Does not have enough bile salts. These salts help the body absorb and digest fats. In some cases, this condition can also be caused by the gallbladder not emptying completely or often enough. What increases the risk? The following factors may make you more likely to develop this condition:  Being female.  Having multiple pregnancies. Health care providers sometimes advise removing diseased gallbladders before future pregnancies.  Eating a diet that is heavy in fried foods, fat, and refined carbohydrates, like white bread and white rice.  Being obese.  Being older than age 40.  Prolonged use of medicines that contain female hormones (estrogen).  Having diabetes mellitus.  Rapidly losing weight.  Having a family history of gallstones.  Being of [place] or [place] descent.  Having an intestinal disease such as Crohn disease.  Having metabolic syndrome.  Having cirrhosis.  Having severe types of anemia such as sickle cell anemia. What are the signs or symptoms? In most cases, there are no symptoms. These are known as silent gallstones. If a gallstone blocks the bile ducts, it can cause a gallbladder attack. The main symptom of a gallbladder attack is sudden pain in the upper right abdomen. The pain usually comes at night or after eating a large meal. The pain can last for one or several hours and can spread to the right shoulder or chest. If the bile duct is blocked for more than a few hours, it can cause infection or inflammation of the gallbladder, liver, or pancreas, which may cause:  Nausea.  Vomiting.  Abdominal pain that lasts for 5 hours or more.  Fever or chills.  Yellowing of the skin or the whites of the eyes  (jaundice).  Dark urine.  Light-colored stools. How is this diagnosed? This condition may be diagnosed based on:  A physical exam.  Your medical history.  An ultrasound of your gallbladder.  CT scan.  MRI.  Blood tests to check for signs of infection or inflammation.  A scan of your gallbladder and bile ducts (biliary system) using nonharmful radioactive material and special cameras that can see the radioactive material (cholescintigram). This test checks to see how your gallbladder contracts and whether bile ducts are blocked.  Inserting a small tube with a camera on the end (endoscope) through your mouth to inspect bile ducts and check for blockages (endoscopic retrograde cholangiopancreatogram). How is this treated? Treatment for gallstones depends on the severity of the condition. Silent gallstones do not need treatment. If the gallstones cause a gallbladder attack or other symptoms, treatment may be required. Options for treatment include:  Surgery to remove the gallbladder (cholecystectomy). This is the most common treatment.  Medicines to  dissolve gallstones. These are most effective at treating small gallstones. You may need to take medicines for up to 6-12 months.  Shock wave treatment (extracorporeal biliary lithotripsy). In this treatment, an ultrasound machine sends shock waves to the gallbladder to break gallstones into smaller pieces. These pieces can then be passed into the intestines or be dissolved by medicine. This is rarely used.  Removing gallstones through endoscopic retrograde cholangiopancreatogram. A small basket can be attached to the endoscope and used to capture and remove gallstones. Follow these instructions at home:  Take over-the-counter and prescription medicines only as told by your health care provider.  Maintain a healthy weight and follow a healthy diet. This includes: ? Reducing fatty foods, such as fried food. ? Reducing refined  carbohydrates, like white bread and white rice. ? Increasing fiber. Aim for foods like almonds, fruit, and beans.  Keep all follow-up visits as told by your health care provider. This is important. Contact a health care provider if:  You think you have had a gallbladder attack.  You have been diagnosed with silent gallstones and you develop abdominal pain or indigestion. Get help right away if:  You have pain from a gallbladder attack that lasts for more than 2 hours.  You have abdominal pain that lasts for more than 5 hours.  You have a fever or chills.  You have persistent nausea and vomiting.  You develop jaundice.  You have dark urine or light-colored stools. Summary  Cholelithiasis (also called gallstones) is a form of gallbladder disease in which gallstones form in the gallbladder.  This condition is caused by an imbalance in the substances that make up bile. This can happen if the bile has too much cholesterol, too much bilirubin, or not enough bile salts.  You are more likely to develop this condition if you are female, pregnant, using medicines with estrogen, obese, older than age 64, or have a family history of gallstones. You may also develop gallstones if you have diabetes, an intestinal disease, cirrhosis, or metabolic syndrome.  Treatment for gallstones depends on the severity of the condition. Silent gallstones do not need treatment.  If gallstones cause a gallbladder attack or other symptoms, treatment may be needed. The most common treatment is surgery to remove the gallbladder. This information is not intended to replace advice given to you by your health care provider. Make sure you discuss any questions you have with your health care provider. Document Revised: 03/23/2017 Document Reviewed: 12/26/2015 Elsevier Patient Education  2020 [employer].

## 2019-06-26 NOTE — Progress Notes (Signed)
Assessment and Plan:  Kimberly Howard was seen today for follow-up.  Diagnoses and all orders for this visit:  Morbid obesity ([place]) Making excellent progress with phentermine, hasn't started topamax but interested after discussion today; start 25 mg once daily in the evening Increase water - 65+ fluid ounces daily, set timer if needed Continue with excellent diet and exercise progress Weigh weekly - goal 0.5-2 lb/week Follow up in 3 months   Essential hypertension Continue medication Monitor blood pressure at home; call if consistently over 130/80 Continue DASH diet.   Reminder to go to the ER if any CP, SOB, nausea, dizziness, severe HA, changes vision/speech, left arm numbness and tingling and jaw pain.  Calculus of gallbladder without cholecystitis without obstruction Monitor; will need lab chole at some point, denies significant sx at this time, declines gen surgery referral   Liver enzyme elevation Monitor; avoid tylenol, alcohol, weight loss advised.   Depression Start new medication as prescribed; lexapro 20 mg daily  Stress management techniques discussed, increase water, good sleep hygiene discussed, increase exercise, and increase veggies.  Follow up 8-12 weeks month, call the office if any new AE's from medications and we will switch them  Further disposition pending results of labs. Discussed med's effects and SE's.   Over 30 minutes of exam, counseling, chart review, and critical decision making was performed.   Future Appointments  Date Time Provider [place]  10/02/2019  4:30 PM Liane Comber, NP [facility] None  03/16/2020  2:00 PM Liane Comber, NP [facility] None    ------------------------------------------------------------------------------------------------------------------   HPI 39 y.o.female presents for 3 month follow up for morbid obesity on phentermine, htn monitoring.   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  Depression - she has some stress r/t pandemic and kids, was on wellbutrin 150 mg though didn't note benefit with this and stopped. Reports does continue to have some stress and always feeling tense. Sleep is irregular but attributes this to infant at home.   She has had mild persistent LFT elevation, had negative hepatitis panel 03/17/2019, abd US showed normal liver/bile duct but with numerous cholelithiasis ~54mm without appearance of cholecystitis on 04/01/2019. Will likely require cholecystectomy at some point. Reports occasional epigastric discomfort but improved recently.   Lab Results  Component Value Date   ALT 34 (H) 03/17/2019   AST 21 03/17/2019   ALKPHOS 81 12/06/2018   BILITOT 0.2 03/17/2019   Her blood pressure has been controlled at home, today their BP is BP: 124/88  She does not workout. She denies chest pain, shortness of breath, dizziness.   she is prescribed phentermine for weight loss. Topamax was sent in by Dr. Melford Aase but she never started. She takes phentermine whole tab 3-4 tabs per week. While on the medication they have lost 7 lbs since last visit. They deny palpitations, anxiety, trouble sleeping, elevated BP.   BMI is Body mass index is 35.21 kg/m. has new Pel[place] for Christmas, gets on daily even if just for a few min over lunch break as working from home. Also doing some  Wt Readings from Last 3 Encounters:  06/26/19 211 lb 9.6 oz (96 kg)  04/28/19 218 lb (98.9 kg)  03/17/19 217 lb (98.4 kg)   Typical breakfast: Mon-Fri, 12 oz iced coffee with splash of  creamer, no sugar, greek yogurt Typical lunch: doing better, more salads, leftovers from the dinner  Snack: apple or banana, occasional bag of chips Typical dinner: Protein - chicken,  occasional steak or chicken, typically baked or air fryer, or husband will grill, with steamed vegetables with garlic salt  Exercise: daily on pelaton, 15 min at minimum, also with walk outdoors if weather allows, some yoga and meditation  Water intake: Has increased from 36 to 45-55 fluid ounces, will do sparkling water, doing more tea   Alcohol 1-2 glasses per month  Sleep: irregular due to infant   Lab Results  Component Value Date   ALT 34 (H) 03/17/2019   AST 21 03/17/2019   ALKPHOS 81 12/06/2018   BILITOT 0.2 03/17/2019    Lab Results  Component Value Date   NA 138 03/17/2019   K 4.1 03/17/2019   CL 104 03/17/2019   CO2 23 03/17/2019   GLUCOSE 90 03/17/2019   BUN 9 03/17/2019   CREATININE 0.76 03/17/2019   CALCIUM 10.1 03/17/2019   GFRAA 117 03/17/2019   GFRNONAA 101 03/17/2019     Past Medical History:  Diagnosis Date  . Anxiety   . Depression   . Fibroid   . History of pre-eclampsia   . 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 Migraines   . Preeclampsia 05/28/2013  . Status post cesarean delivery 12/07/2018     Allergies  Allergen Reactions  . Adhesive [Tape]     Current Outpatient Medications on File Prior to Visit  Medication Sig  . Cholecalciferol (VITAMIN D) 125 MCG (5000 UT) CAPS Take by mouth daily.  . Multiple Vitamins-Minerals (MULTIVITAMIN ADULT PO) Take by mouth daily.  . phentermine (ADIPEX-P) 37.5 MG tablet Take 1/2 to 1 tablet every Morning for Dieting & Weight Loss  . verapamil (CALAN-SR) 240 MG CR tablet Take 1 tablet (240 mg total) by mouth at bedtime.  . topiramate (TOPAMAX) 50 MG tablet Take 1/2 to 1 tablet 2 x /day at Suppertime & Bedtime for Dieting & Weight Loss (Patient not taking: Reported on 06/26/2019)   No current facility-administered medications on file prior to visit.    ROS: all negative except above.   Physical Exam:  BP 124/88   Pulse (!) 105   Temp (!) 97.3 F (36.3 C)   Wt 211 lb 9.6 oz (96  kg)   SpO2 99%   BMI 35.21 kg/m   General Appearance: Well nourished,obese AA female in no apparent distress. Eyes: conjunctiva no swelling or erythema Sinuses: ENT/Mouth: Mask in place; Hearing normal.  Neck: Supple, thyroid normal.  Respiratory: Respiratory effort normal, BS equal bilaterally without rales, rhonchi, wheezing or stridor.  Cardio: RRR with no MRGs. Brisk peripheral pulses without edema.  Abdomen: Soft, + BS.  Non tender. Lymphatics: Non tender without lymphadenopathy.  Musculoskeletal: No obvious deformity, normal gait.  Skin: Warm, dry without rashes, lesions, ecchymosis.  Neuro: Normal muscle tone, no cerebellar symptoms.  Psych: Awake and oriented X 3, normal affect, Insight and Judgment appropriate.     Izora Ribas, NP 5:07 PM [facility]

## 2019-10-02 ENCOUNTER — Ambulatory Visit: Payer: PRIVATE HEALTH INSURANCE | Admitting: Adult Health

## 2019-10-02 ENCOUNTER — Other Ambulatory Visit: Payer: Self-pay

## 2019-10-02 ENCOUNTER — Encounter: Payer: Self-pay | Admitting: Adult Health

## 2019-10-02 VITALS — BP 120/74 | HR 87 | Temp 97.3°F | Wt 197.0 lb

## 2019-10-02 DIAGNOSIS — I10 Essential (primary) hypertension: Secondary | ICD-10-CM | POA: Diagnosis not present

## 2019-10-02 DIAGNOSIS — R748 Abnormal levels of other serum enzymes: Secondary | ICD-10-CM | POA: Diagnosis not present

## 2019-10-02 DIAGNOSIS — E559 Vitamin D deficiency, unspecified: Secondary | ICD-10-CM

## 2019-10-02 DIAGNOSIS — F329 Major depressive disorder, single episode, unspecified: Secondary | ICD-10-CM

## 2019-10-02 DIAGNOSIS — G43809 Other migraine, not intractable, without status migrainosus: Secondary | ICD-10-CM

## 2019-10-02 DIAGNOSIS — L299 Pruritus, unspecified: Secondary | ICD-10-CM

## 2019-10-02 DIAGNOSIS — F32A Depression, unspecified: Secondary | ICD-10-CM

## 2019-10-02 DIAGNOSIS — E669 Obesity, unspecified: Secondary | ICD-10-CM

## 2019-10-02 DIAGNOSIS — K8020 Calculus of gallbladder without cholecystitis without obstruction: Secondary | ICD-10-CM

## 2019-10-02 MED ORDER — COLESTIPOL HCL 1 G PO TABS: 1.0000 g | ORAL_TABLET | Freq: Two times a day (BID) | ORAL | 2 refills | Status: DC

## 2019-10-02 NOTE — Patient Instructions (Addendum)
Laparoscopic Cholecystectomy Laparoscopic cholecystectomy is surgery to remove the gallbladder. The gallbladder is a pear-shaped organ that lies beneath the liver on the right side of the body. The gallbladder stores bile, which is a fluid that helps the body to digest fats. Cholecystectomy is often done for inflammation of the gallbladder (cholecystitis). This condition is usually caused by a buildup of gallstones (cholelithiasis) in the gallbladder. Gallstones can block the flow of bile, which can result in inflammation and pain. In severe cases, emergency surgery may be required. This procedure is done though small incisions in your abdomen (laparoscopic surgery). A thin scope with a camera (laparoscope) is inserted through one incision. Thin surgical instruments are inserted through the other incisions. In some cases, a laparoscopic procedure may be turned into a type of surgery that is done through a larger incision (open surgery). Tell a health care provider about:  Any allergies you have.  All medicines you are taking, including vitamins, herbs, eye drops, creams, and over-the-counter medicines.  Any problems you or family members have had with anesthetic medicines.  Any blood disorders you have.  Any surgeries you have had.  Any medical conditions you have.  Whether you are pregnant or may be pregnant. What are the risks? Generally, this is a safe procedure. However, problems may occur, including:  Infection.  Bleeding.  Allergic reactions to medicines.  Damage to other structures or organs.  A stone remaining in the common bile duct. The common bile duct carries bile from the gallbladder into the small intestine.  A bile leak from the cyst duct that is clipped when your gallbladder is removed. What happens before the procedure? Staying hydrated Follow instructions from your health care provider about hydration, which may include:  Up to 2 hours before the  procedure - you may continue to drink clear liquids, such as water, clear fruit juice, black coffee, and plain tea. Eating and drinking restrictions Follow instructions from your health care provider about eating and drinking, which may include:  8 hours before the procedure - stop eating heavy meals or foods such as meat, fried foods, or fatty foods.  6 hours before the procedure - stop eating light meals or foods, such as toast or cereal.  6 hours before the procedure - stop drinking milk or drinks that contain milk.  2 hours before the procedure - stop drinking clear liquids. Medicines  Ask your health care provider about: ? Changing or stopping your regular medicines. This is especially important if you are taking diabetes medicines or blood thinners. ? Taking medicines such as aspirin and ibuprofen. These medicines can thin your blood. Do not take these medicines before your procedure if your health care provider instructs you not to.  You may be given antibiotic medicine to help prevent infection. General instructions  Let your health care provider know if you develop a cold or an infection before surgery.  Plan to have someone take you home from the hospital or clinic.  Ask your health care provider how your surgical site will be marked or identified. What happens during the procedure?   To reduce your risk of infection: ? Your health care team will wash or sanitize their hands. ? Your skin will be washed with soap. ? Hair may be removed from the surgical area.  An IV tube may be inserted into one of your veins.  You will be given one or more of the following: ? A medicine to help you  relax (sedative). ? A medicine to make you fall asleep (general anesthetic).  A breathing tube will be placed in your mouth.  Your surgeon will make several small cuts (incisions) in your abdomen.  The laparoscope will be inserted through one of the small incisions. The camera on the  laparoscope will send images to a TV screen (monitor) in the operating room. This lets your surgeon see inside your abdomen.  Air-like gas will be pumped into your abdomen. This will expand your abdomen to give the surgeon more room to perform the surgery.  Other tools that are needed for the procedure will be inserted through the other incisions. The gallbladder will be removed through one of the incisions.  Your common bile duct may be examined. If stones are found in the common bile duct, they may be removed.  After your gallbladder has been removed, the incisions will be closed with stitches (sutures), staples, or skin glue.  Your incisions may be covered with a bandage (dressing). The procedure may vary among health care providers and hospitals. What happens after the procedure?  Your blood pressure, heart rate, breathing rate, and blood oxygen level will be monitored until the medicines you were given have worn off.  You will be given medicines as needed to control your pain.  Do not drive for 24 hours if you were given a sedative. This information is not intended to replace advice given to you by your health care provider. Make sure you discuss any questions you have with your health care provider. Document Revised: 03/23/2017 Document Reviewed: 09/27/2015 Elsevier Patient Education  [place].      Laparoscopic Cholecystectomy, Care After This sheet gives you information about how to care for yourself after your procedure. Your health care provider may also give you more specific instructions. If you have problems or questions, contact your health care provider. What can I expect after the procedure? After the procedure, it is common to have:  Pain at your incision sites. You will be given medicines to control this pain.  Mild nausea or vomiting.  Bloating and possible shoulder pain from the air-like gas that was used during the procedure. Follow these  instructions at home: Incision care   Follow instructions from your health care provider about how to take care of your incisions. Make sure you: ? Wash your hands with soap and water before you change your bandage (dressing). If soap and water are not available, use hand sanitizer. ? Change your dressing as told by your health care provider. ? Leave stitches (sutures), skin glue, or adhesive strips in place. These skin closures may need to be in place for 2 weeks or longer. If adhesive strip edges start to loosen and curl up, you may trim the loose edges. Do not remove adhesive strips completely unless your health care provider tells you to do that.  Do not take baths, swim, or use a hot tub until your health care provider approves. Ask your health care provider if you can take showers. You may only be allowed to take sponge baths for bathing.  Check your incision area every day for signs of infection. Check for: ? More redness, swelling, or pain. ? More fluid or blood. ? Warmth. ? Pus or a bad smell. Activity  Do not drive or use heavy machinery while taking prescription pain medicine.  Do not lift anything that is heavier than 10 lb (4.5 kg) until your health care provider approves.  Do not  play contact sports until your health care provider approves.  Do not drive for 24 hours if you were given a medicine to help you relax (sedative).  Rest as needed. Do not return to work or school until your health care provider approves. General instructions  Take over-the-counter and prescription medicines only as told by your health care provider.  To prevent or treat constipation while you are taking prescription pain medicine, your health care provider may recommend that you: ? Drink enough fluid to keep your urine clear or pale yellow. ? Take over-the-counter or prescription medicines. ? Eat foods that are high in fiber, such as fresh fruits and vegetables, whole grains, and  beans. ? Limit foods that are high in fat and processed sugars, such as fried and sweet foods. Contact a health care provider if:  You develop a rash.  You have more redness, swelling, or pain around your incisions.  You have more fluid or blood coming from your incisions.  Your incisions feel warm to the touch.  You have pus or a bad smell coming from your incisions.  You have a fever.  One or more of your incisions breaks open. Get help right away if:  You have trouble breathing.  You have chest pain.  You have increasing pain in your shoulders.  You faint or feel dizzy when you stand.  You have severe pain in your abdomen.  You have nausea or vomiting that lasts for more than one day.  You have leg pain. This information is not intended to replace advice given to you by your health care provider. Make sure you discuss any questions you have with your health care provider. Document Revised: 03/23/2017 Document Reviewed: 09/27/2015 Elsevier Patient Education  [place].

## 2019-10-02 NOTE — Progress Notes (Signed)
Assessment and Plan:  Kimberly Howard was seen today for follow-up.  Diagnoses and all orders for this visit:  Obesity  Making excellent progress with phentermine/topiramate Increase water - 65+ fluid ounces daily, set timer if needed Continue with excellent diet and exercise progress Weigh weekly - goal 0.5-2 lb/week Follow up in 3 months   Essential hypertension Well controlled; Continue medication Monitor blood pressure at home; call if consistently over 130/80 Continue DASH diet.   Reminder to go to the ER if any CP, SOB, nausea, dizziness, severe HA, changes vision/speech, left arm numbness and tingling and jaw pain.  Calculus of gallbladder without cholecystitis without obstruction Monitor; will need lab chole at some point,  Mild intermittent sx, avoiding fatty foods, lifestyle modification  Have discussed elective lap chole; she has 13 month old infant, weight restriction requirements would be difficult  ? Itching may be related, may benefit from low dose bile acid sequestrant - will trial low dose cholstipol 1g BID for 1 month and evaluate benefit with this  Liver enzyme elevation Monitor; avoid tylenol, alcohol, continue with weight loss efforts   Further disposition pending results of labs. Discussed med's effects and SE's.   Over 30 minutes of exam, counseling, chart review, and critical decision making was performed.   Future Appointments  Date Time Provider [place]  03/16/2020  2:00 PM Liane Comber, NP [facility] None    ------------------------------------------------------------------------------------------------------------------  HPI 39 y.o.female presents for 3 month follow up for morbid obesity on phentermine, htn monitoring.   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   Depression - she has some stress r/t pandemic and kids, was on wellbutrin 150 mg though didn't note benefit with this and stopped, has been on trial of 20 mg lexapro but states hasn't noted benefit at week 12. At this time she would prefer to come off of medications.   She has had mild persistent LFT elevation, had negative hepatitis panel 03/17/2019, abd US showed normal liver/bile duct but with numerous cholelithiasis ~60mm without appearance of cholecystitis on 04/01/2019. Will likely require cholecystectomy at some point. Reports occasional epigastric discomfort, also wonders if mild persistent itching of bilateral forearms may be related to this. Has seen derm with numerous attempts at topicals without notable improvement. No distinct rash but notes mild texture change to skin.   Lab Results  Component Value Date   ALT 34 (H) 03/17/2019   AST 21 03/17/2019   ALKPHOS 81 12/06/2018   BILITOT 0.2 03/17/2019   Her blood pressure has been controlled at home, today their BP is BP: 120/74  She does not workout. She denies chest pain, shortness of breath, dizziness.   she is prescribed phentermine and topiramate for weight loss. She takes phentermine whole tab 2-3 tabs per week. While on the medication they have lost 14 lbs since last visit. They deny palpitations, anxiety, trouble sleeping, elevated BP.   BMI is Body mass index is 32.78 kg/m. has new Pelaton for Christmas, gets on daily even if just for a few min over lunch break as working from home.  Wt Readings from Last 3 Encounters:  10/02/19 197 lb (89.4 kg)  06/26/19 211 lb 9.6 oz (96 kg)  04/28/19 218 lb (98.9 kg)   Typical breakfast:  Mon-Fri, 12 oz iced coffee with splash of creamer, no sugar, greek yogurt Typical lunch: doing better, more salads, leftovers from the dinner  Snack: apple or banana, occasional bag of chips Typical  dinner: Protein - chicken, occasional steak or chicken, typically baked or air fryer, or husband will grill, with steamed vegetables with garlic salt  Exercise: daily on pelaton, 15 min at minimum, also with walk outdoors if weather allows, some yoga and meditation  Water intake: Has increased from 36 to 45-55 fluid ounces, will do sparkling water, doing more tea   Alcohol 1-2 glasses per month  Sleep: irregular due to infant but improving     Past Medical History:  Diagnosis Date   Anxiety    Depression    Fibroid    History of pre-eclampsia    Hypertension 2015   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   escitalopram (LEXAPRO) 20 MG tablet Take 1 tablet (20 mg total) by mouth daily.   Multiple Vitamins-Minerals (MULTIVITAMIN ADULT PO) Take by mouth daily.   phentermine (ADIPEX-P) 37.5 MG tablet Take 1/2 to 1 tablet every Morning for Dieting & Weight Loss   topiramate (TOPAMAX) 50 MG tablet Take 1/2 to 1 tablet 2 x /day at Suppertime & Bedtime for Dieting & Weight Loss   verapamil (CALAN-SR) 240 MG CR tablet Take 1 tablet (240 mg total) by mouth at bedtime.   No current facility-administered medications on file prior to visit.    ROS: all negative except above.   Physical Exam:  BP 120/74    Pulse 87    Temp (!) 97.3 F (36.3 C)    Wt 197 lb (89.4 kg)    SpO2 99%    BMI 32.78 kg/m   General Appearance: Well nourished,obese AA female in no apparent distress. Eyes: conjunctiva no swelling or erythema Sinuses: ENT/Mouth: Mask in place; Hearing normal.  Neck: Supple, thyroid normal.  Respiratory: Respiratory effort normal, BS equal bilaterally without rales, rhonchi, wheezing or  stridor.  Cardio: RRR with no MRGs. Brisk peripheral pulses without edema.  Abdomen: Soft obese, + BS.  Non tender. Lymphatics: Non tender without lymphadenopathy.  Musculoskeletal: No obvious deformity, normal gait.  Skin: Warm, dry without rashes, lesions, ecchymosis.  Neuro: Normal muscle tone, no cerebellar symptoms.  Psych: Awake and oriented X 3, normal affect, Insight and Judgment appropriate.     Izora Ribas, NP 5:00 PM [facility]

## 2019-11-13 ENCOUNTER — Other Ambulatory Visit: Payer: Self-pay | Admitting: Adult Health

## 2019-11-13 MED ORDER — BUSPIRONE HCL 5 MG PO TABS: ORAL_TABLET | ORAL | 0 refills | Status: DC

## 2019-12-02 ENCOUNTER — Other Ambulatory Visit: Payer: Self-pay | Admitting: Internal Medicine

## 2019-12-02 ENCOUNTER — Other Ambulatory Visit: Payer: Self-pay | Admitting: Adult Health

## 2019-12-02 DIAGNOSIS — Z6835 Body mass index (BMI) 35.0-35.9, adult: Secondary | ICD-10-CM

## 2019-12-02 MED ORDER — VERAPAMIL HCL ER 240 MG PO TBCR: 240.0000 mg | EXTENDED_RELEASE_TABLET | Freq: Every day | ORAL | 1 refills | Status: DC

## 2019-12-31 ENCOUNTER — Ambulatory Visit: Payer: PRIVATE HEALTH INSURANCE | Admitting: Adult Health

## 2020-01-08 ENCOUNTER — Other Ambulatory Visit: Payer: Self-pay

## 2020-01-08 ENCOUNTER — Encounter: Payer: Self-pay | Admitting: Adult Health

## 2020-01-08 ENCOUNTER — Ambulatory Visit: Payer: PRIVATE HEALTH INSURANCE | Admitting: Adult Health

## 2020-01-08 VITALS — BP 122/80 | HR 87 | Temp 96.8°F | Wt 196.8 lb

## 2020-01-08 DIAGNOSIS — K8020 Calculus of gallbladder without cholecystitis without obstruction: Secondary | ICD-10-CM | POA: Diagnosis not present

## 2020-01-08 DIAGNOSIS — E669 Obesity, unspecified: Secondary | ICD-10-CM | POA: Diagnosis not present

## 2020-01-08 DIAGNOSIS — I10 Essential (primary) hypertension: Secondary | ICD-10-CM

## 2020-01-08 DIAGNOSIS — F32A Depression, unspecified: Secondary | ICD-10-CM

## 2020-01-08 DIAGNOSIS — G43809 Other migraine, not intractable, without status migrainosus: Secondary | ICD-10-CM | POA: Diagnosis not present

## 2020-01-08 DIAGNOSIS — F329 Major depressive disorder, single episode, unspecified: Secondary | ICD-10-CM

## 2020-01-08 MED ORDER — VENLAFAXINE HCL ER 75 MG PO CP24: 75.0000 mg | ORAL_CAPSULE | Freq: Every day | ORAL | 2 refills | Status: DC

## 2020-01-08 NOTE — Progress Notes (Signed)
Assessment and Plan:  Ernestina was seen today for follow-up.  Diagnoses and all orders for this visit:  Obesity  Making slow but steady progress with phentermine/topiramate Hit goal of <200lb, new goal <190 lb Increase water - 65+ fluid ounces daily, set timer if needed Continue with diet and exercise efforts; discussed high fiber/mediterranean diet; discussed exercise goal of 150 min/week  Weigh weekly - goal 0.5-2 lb/week Follow up in 3 months   Essential hypertension Well controlled; Continue medication Monitor blood pressure at home; call if consistently over 130/80 Continue DASH diet.   Reminder to go to the ER if any CP, SOB, nausea, dizziness, severe HA, changes vision/speech, left arm numbness and tingling and jaw pain.  Calculus of gallbladder without cholecystitis without obstruction Monitor; will need lab chole at some point,  Mild intermittent sx, avoiding fatty foods, lifestyle modification  Have discussed elective lap chole; she would like to pursue this in 2022 with new insurance  Liver enzyme elevation  avoid tylenol, alcohol, continue with weight loss efforts  Depression/anxiety Poor response with wellbutrin, lexapro, buspar; will try effexor 75 mg daily Likely would benefit from CBT; continue to discuss Lifestyle discussed: diet/exerise, sleep hygiene, stress management, hydration   Further disposition pending results of labs. Discussed med's effects and SE's.   Over 30 minutes of exam, counseling, chart review, and critical decision making was performed.   Future Appointments  Date Time Provider [place]  03/16/2020  2:00 PM Liane Comber, NP [facility] None    ------------------------------------------------------------------------------------------------------------------  HPI 39 y.o.female presents for 3 month follow up for morbid obesity on phentermine, htn monitoring. 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  Depression - she has some stress r/t pandemic and kids, no benefit with wellbutrin 150 mg, lexapro 20 mg, buspirone 5 mg TID (sedation). She was previously on low dose effexor for migraine benefit, will try.   She has had mild persistent LFT elevation, had negative hepatitis panel 03/17/2019, abd US showed normal liver/bile duct but with numerous cholelithiasis ~21mm without appearance of cholecystitis on 04/01/2019. Will likely require cholecystectomy at some point but trying to postpone until next year with better. Reports occasional epigastric discomfort. She had bil arm persistent itching without rash, derm attempted several topicals without improvement, some question if this was r/t above and today resolved with a few weeks on colestipol.   Her blood pressure has been controlled at home, today their BP is BP: 122/80  She does not workout. She denies chest pain, shortness of breath, dizziness.   she is prescribed phentermine and topiramate for weight loss. She takes phentermine whole tab 2-3 tabs per week. While on the medication they have lost 1 lbs since last visit. Denies palpitations, anxiety, trouble sleeping, elevated BP.   BMI is Body mass index is 32.75 kg/m. has new [place] for Christmas, tries to do some sort of activity daily. Admits diet/exercise not as good as she needs due to young children Wt Readings from Last 3 Encounters:  01/08/20 196 lb 12.8 oz (89.3 kg)  10/02/19 197 lb (89.4 kg)  06/26/19 211 lb 9.6 oz (96 kg)   Typical breakfast: Mon-Fri, 12 oz iced coffee with splash of creamer, no sugar, greek yogurt Typical lunch: doing better, more salads, leftovers from the dinner  Snack: apple or banana, occasional bag of chips Typical dinner: Protein - chicken, occasional steak or chicken, typically baked or  air fryer, or husband will grill, with steamed vegetables with garlic salt   Exercise: daily on pelaton, 15 min at minimum, also with walk outdoors if weather allows, some yoga and meditation  Water intake: Has increased from 36 to 45-55 fluid ounces, will do sparkling water, doing more tea   Alcohol 1-2 glasses per month  Sleep: irregular due to infant but improving    Past Medical History:  Diagnosis Date  . Anxiety   . Depression   . Fibroid   . History of pre-eclampsia   . 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 Migraines   . Preeclampsia 05/28/2013  . Status post cesarean delivery 12/07/2018     Allergies  Allergen Reactions  . Adhesive [Tape]     Current Outpatient Medications on File Prior to Visit  Medication Sig  . Cholecalciferol (VITAMIN D) 125 MCG (5000 UT) CAPS Take by mouth daily.  . Multiple Vitamins-Minerals (MULTIVITAMIN ADULT PO) Take by mouth daily.  . phentermine (ADIPEX-P) 37.5 MG tablet TAKE 1/2 TO 1 TABLET BY MOUTH EVERY MORNING FOR DIETING AND WEIGHT LOSS  . topiramate (TOPAMAX) 50 MG tablet Take 1/2 to 1 tablet 2 x /day at Suppertime & Bedtime for Dieting & Weight Loss  . verapamil (CALAN-SR) 240 MG CR tablet Take 1 tablet (240 mg total) by mouth at bedtime.   No current facility-administered medications on file prior to visit.    ROS: all negative except above.   Physical Exam:  BP 122/80   Pulse 87   Temp (!) 96.8 F (36 C)   Wt 196 lb 12.8 oz (89.3 kg)   SpO2 99%   BMI 32.75 kg/m   General Appearance: Well nourished,obese AA female in no apparent distress. Eyes: conjunctiva no swelling or erythema Sinuses: ENT/Mouth: Mask in place; Hearing normal.  Neck: Supple, thyroid normal.  Respiratory: Respiratory effort normal, BS equal bilaterally without rales, rhonchi, wheezing or stridor.  Cardio: RRR with no MRGs. Brisk peripheral pulses without edema.  Abdomen: Soft obese, + BS.  Non tender. Lymphatics: Non tender without lymphadenopathy.  Musculoskeletal: No obvious deformity, normal gait.   Skin: Warm, dry without rashes, lesions, ecchymosis.  Neuro: Normal muscle tone, no cerebellar symptoms.  Psych: Awake and oriented X 3, normal affect, Insight and Judgment appropriate.     Izora Ribas, NP 4:46 PM [facility]

## 2020-01-08 NOTE — Patient Instructions (Addendum)
Goals    . Blood Pressure < 130/80    . DIET - INCREASE WATER INTAKE     80+ fluid ounces daily     . Exercise 150 min/wk Moderate Activity    . Weight (lb) < 200 lb (90.7 kg)     Keep journal to track habits and progress Aim for 0.5-2 lb / week  Aim to make small sustainable changes         Mediterranean Diet A Mediterranean diet refers to food and lifestyle choices that are based on the traditions of countries located on the [employer]. This way of eating has been shown to help prevent certain conditions and improve outcomes for people who have chronic diseases, like kidney disease and heart disease. What are tips for following this plan? Lifestyle  Cook and eat meals together with your family, when possible.  Drink enough fluid to keep your urine clear or pale yellow.  Be physically active every day. This includes: ? Aerobic exercise like running or swimming. ? Leisure activities like gardening, walking, or housework.  Get 7-8 hours of sleep each night.  If recommended by your health care provider, drink red wine in moderation. This means 1 glass a day for nonpregnant women and 2 glasses a day for men. A glass of wine equals 5 oz (150 mL). Reading food labels   Check the serving size of packaged foods. For foods such as rice and pasta, the serving size refers to the amount of cooked product, not dry.  Check the total fat in packaged foods. Avoid foods that have saturated fat or trans fats.  Check the ingredients list for added sugars, such as corn syrup. Shopping  At the grocery store, buy most of your food from the areas near the walls of the store. This includes: ? Fresh fruits and vegetables (produce). ? Grains, beans, nuts, and seeds. Some of these may be available in unpackaged forms or large amounts (in bulk). ? Fresh seafood. ? Poultry and eggs. ? Low-fat dairy products.  Buy whole ingredients instead of prepackaged foods.  Buy fresh fruits and  vegetables in-season from local farmers markets.  Buy frozen fruits and vegetables in resealable bags.  If you do not have access to quality fresh seafood, buy precooked frozen shrimp or canned fish, such as tuna, salmon, or sardines.  Buy small amounts of raw or cooked vegetables, salads, or olives from the deli or salad bar at your store.  Stock your pantry so you always have certain foods on hand, such as olive oil, canned tuna, canned tomatoes, rice, pasta, and beans. Cooking  Cook foods with extra-virgin olive oil instead of using butter or other vegetable oils.  Have meat as a side dish, and have vegetables or grains as your main dish. This means having meat in small portions or adding small amounts of meat to foods like pasta or stew.  Use beans or vegetables instead of meat in common dishes like chili or lasagna.  Experiment with different cooking methods. Try roasting or broiling vegetables instead of steaming or sauteing them.  Add frozen vegetables to soups, stews, pasta, or rice.  Add nuts or seeds for added healthy fat at each meal. You can add these to yogurt, salads, or vegetable dishes.  Marinate fish or vegetables using olive oil, lemon juice, garlic, and fresh herbs. Meal planning   Plan to eat 1 vegetarian meal one day each week. Try to work up to 2 vegetarian meals, if possible.  Eat seafood 2 or more times a week.  Have healthy snacks readily available, such as: ? Vegetable sticks with hummus. ? [place] yogurt. ? Fruit and nut trail mix.  Eat balanced meals throughout the week. This includes: ? Fruit: 2-3 servings a day ? Vegetables: 4-5 servings a day ? Low-fat dairy: 2 servings a day ? Fish, poultry, or lean meat: 1 serving a day ? Beans and legumes: 2 or more servings a week ? Nuts and seeds: 1-2 servings a day ? Whole grains: 6-8 servings a day ? Extra-virgin olive oil: 3-4 servings a day  Limit red meat and sweets to only a few servings a  month What are my food choices?  Mediterranean diet ? Recommended  Grains: Whole-grain pasta. Brown rice. Bulgar wheat. Polenta. Couscous. Whole-wheat bread. [place].  Vegetables: Artichokes. Beets. Broccoli. Cabbage. Carrots. Eggplant. Green beans. Chard. Kale. Spinach. Onions. Leeks. Peas. Squash. Tomatoes. Peppers. Radishes.  Fruits: Apples. Apricots. Avocado. Berries. Bananas. Cherries. Dates. Figs. Grapes. Lemons. Melon. Oranges. Peaches. Plums. Pomegranate.  Meats and other protein foods: Beans. Almonds. Sunflower seeds. Pine nuts. Peanuts. [place]. Salmon. Scallops. Shrimp. [place]. Tilapia. Clams. Oysters. Eggs.  Dairy: Low-fat milk. Cheese. Greek yogurt.  Beverages: Water. Red wine. Herbal tea.  Fats and oils: Extra virgin olive oil. Avocado oil. Grape seed oil.  Sweets and desserts: [place] yogurt with honey. Baked apples. Poached pears. Trail mix.  Seasoning and other foods: Basil. Cilantro. Coriander. Cumin. Mint. Parsley. Sage. Rosemary. Tarragon. Garlic. Oregano. Thyme. Pepper. Balsalmic vinegar. Tahini. Hummus. Tomato sauce. Olives. Mushrooms. ? Limit these  Grains: Prepackaged pasta or rice dishes. Prepackaged cereal with added sugar.  Vegetables: Deep fried potatoes (french fries).  Fruits: Fruit canned in syrup.  Meats and other protein foods: Beef. Pork. Lamb. Poultry with skin. Hot dogs. [place] Salines.  Dairy: Ice cream. Sour cream. Whole milk.  Beverages: Juice. Sugar-sweetened soft drinks. Beer. Liquor and spirits.  Fats and oils: Butter. Canola oil. Vegetable oil. Beef fat (tallow). Lard.  Sweets and desserts: Cookies. Cakes. Pies. Candy.  Seasoning and other foods: Mayonnaise. Premade sauces and marinades. The items listed may not be a complete list. Talk with your dietitian about what dietary choices are right for you. Summary  The Mediterranean diet includes both food and lifestyle choices.  Eat a variety of fresh fruits and vegetables, beans, nuts,  seeds, and whole grains.  Limit the amount of red meat and sweets that you eat.  Talk with your health care provider about whether it is safe for you to drink red wine in moderation. This means 1 glass a day for nonpregnant women and 2 glasses a day for men. A glass of wine equals 5 oz (150 mL). This information is not intended to replace advice given to you by your health care provider. Make sure you discuss any questions you have with your health care provider. Document Revised: 12/09/2015 Document Reviewed: 12/02/2015 Elsevier Patient Education  [place].     Venlafaxine extended-release capsules What is this medicine? VENLAFAXINE(VEN la fax een) is used to treat depression, anxiety and panic disorder. This medicine may be used for other purposes; ask your health care provider or pharmacist if you have questions. COMMON BRAND NAME(S): Effexor XR What should I tell my health care provider before I take this medicine? They need to know if you have any of these conditions:  bleeding disorders  glaucoma  heart disease  high blood pressure  high cholesterol  kidney disease  liver disease  low levels  of sodium in the blood  mania or bipolar disorder  seizures  suicidal thoughts, plans, or attempt; a previous suicide attempt by you or a family  take medicines that treat or prevent blood clots  thyroid disease  an unusual or allergic reaction to venlafaxine, desvenlafaxine, other medicines, foods, dyes, or preservatives  pregnant or trying to get pregnant  breast-feeding How should I use this medicine? Take this medicine by mouth with a full glass of water. Follow the directions on the prescription label. Do not cut, crush, or chew this medicine. Take it with food. If needed, the capsule may be carefully opened and the entire contents sprinkled on a spoonful of cool applesauce. Swallow the applesauce/pellet mixture right away without chewing and follow with a  glass of water to ensure complete swallowing of the pellets. Try to take your medicine at about the same time each day. Do not take your medicine more often than directed. Do not stop taking this medicine suddenly except upon the advice of your doctor. Stopping this medicine too quickly may cause serious side effects or your condition may worsen. A special MedGuide will be given to you by the pharmacist with each prescription and refill. Be sure to read this information carefully each time. Talk to your pediatrician regarding the use of this medicine in children. Special care may be needed. Overdosage: If you think you have taken too much of this medicine contact a poison control center or emergency room at once. NOTE: This medicine is only for you. Do not share this medicine with others. What if I miss a dose? If you miss a dose, take it as soon as you can. If it is almost time for your next dose, take only that dose. Do not take double or extra doses. What may interact with this medicine? Do not take this medicine with any of the following medications:  certain medicines for fungal infections like fluconazole, itraconazole, ketoconazole, posaconazole, voriconazole  cisapride  desvenlafaxine  dronedarone  duloxetine  levomilnacipran  linezolid  MAOIs like Carbex, Eldepryl, Marplan, Nardil, and Parnate  methylene blue (injected into a vein)  milnacipran  pimozide  thioridazine This medicine may also interact with the following medications:  amphetamines  aspirin and aspirin-like medicines  certain medicines for depression, anxiety, or psychotic disturbances  certain medicines for migraine headaches like almotriptan, eletriptan, frovatriptan, naratriptan, rizatriptan, sumatriptan, zolmitriptan  certain medicines for sleep  certain medicines that treat or prevent blood clots like dalteparin, enoxaparin,  warfarin  cimetidine  clozapine  diuretics  fentanyl  furazolidone  indinavir  isoniazid  lithium  metoprolol  NSAIDS, medicines for pain and inflammation, like ibuprofen or naproxen  other medicines that prolong the QT interval (cause an abnormal heart rhythm) like dofetilide, ziprasidone  procarbazine  rasagiline  supplements like St. John's wort, kava kava, valerian  tramadol  tryptophan This list may not describe all possible interactions. Give your health care provider a list of all the medicines, herbs, non-prescription drugs, or dietary supplements you use. Also tell them if you smoke, drink alcohol, or use illegal drugs. Some items may interact with your medicine. What should I watch for while using this medicine? Tell your doctor if your symptoms do not get better or if they get worse. Visit your doctor or health care professional for regular checks on your progress. Because it may take several weeks to see the full effects of this medicine, it is important to continue your treatment as prescribed by your doctor. Patients  and their families should watch out for new or worsening thoughts of suicide or depression. Also watch out for sudden changes in feelings such as feeling anxious, agitated, panicky, irritable, hostile, aggressive, impulsive, severely restless, overly excited and hyperactive, or not being able to sleep. If this happens, especially at the beginning of treatment or after a change in dose, call your health care professional. This medicine can cause an increase in blood pressure. Check with your doctor for instructions on monitoring your blood pressure while taking this medicine. You may get drowsy or dizzy. Do not drive, use machinery, or do anything that needs mental alertness until you know how this medicine affects you. Do not stand or sit up quickly, especially if you are an older patient. This reduces the risk of dizzy or fainting spells. Alcohol may  interfere with the effect of this medicine. Avoid alcoholic drinks. Your mouth may get dry. Chewing sugarless gum, sucking hard candy and drinking plenty of water will help. Contact your doctor if the problem does not go away or is severe. What side effects may I notice from receiving this medicine? Side effects that you should report to your doctor or health care professional as soon as possible:  allergic reactions like skin rash, itching or hives, swelling of the face, lips, or tongue  anxious  breathing problems  confusion  changes in vision  chest pain  confusion  elevated mood, decreased need for sleep, racing thoughts, impulsive behavior  eye pain  fast, irregular heartbeat  feeling faint or lightheaded, falls  feeling agitated, angry, or irritable  hallucination, loss of contact with reality  high blood pressure  loss of balance or coordination  palpitations  redness, blistering, peeling or loosening of the skin, including inside the mouth  restlessness, pacing, inability to keep still  seizures  stiff muscles  suicidal thoughts or other mood changes  trouble passing urine or change in the amount of urine  trouble sleeping  unusual bleeding or bruising  unusually weak or tired  vomiting Side effects that usually do not require medical attention (report to your doctor or health care professional if they continue or are bothersome):  change in sex drive or performance  change in appetite or weight  constipation  dizziness  dry mouth  headache  increased sweating  nausea  tired This list may not describe all possible side effects. Call your doctor for medical advice about side effects. You may report side effects to FDA at 1-800-FDA-1088. Where should I keep my medicine? Keep out of the reach of children. Store at a controlled temperature between 20 and 25 degrees C (68 degrees and 77 degrees F), in a dry place. Throw away any unused  medicine after the expiration date. NOTE: This sheet is a summary. It may not cover all possible information. If you have questions about this medicine, talk to your doctor, pharmacist, or health care provider.  2020 Elsevier/Gold Standard (2018-04-02 12:06:43)

## 2020-03-16 ENCOUNTER — Encounter: Payer: PRIVATE HEALTH INSURANCE | Admitting: Adult Health

## 2020-04-29 ENCOUNTER — Encounter: Payer: Self-pay | Admitting: Adult Health

## 2020-04-29 ENCOUNTER — Ambulatory Visit: Payer: PRIVATE HEALTH INSURANCE | Admitting: Adult Health

## 2020-04-29 ENCOUNTER — Other Ambulatory Visit: Payer: Self-pay

## 2020-04-29 VITALS — BP 110/76 | HR 83 | Temp 97.2°F | Ht 65.0 in | Wt 200.0 lb

## 2020-04-29 DIAGNOSIS — G43809 Other migraine, not intractable, without status migrainosus: Secondary | ICD-10-CM

## 2020-04-29 DIAGNOSIS — D649 Anemia, unspecified: Secondary | ICD-10-CM

## 2020-04-29 DIAGNOSIS — Z13220 Encounter for screening for lipoid disorders: Secondary | ICD-10-CM

## 2020-04-29 DIAGNOSIS — Z131 Encounter for screening for diabetes mellitus: Secondary | ICD-10-CM

## 2020-04-29 DIAGNOSIS — Z1389 Encounter for screening for other disorder: Secondary | ICD-10-CM | POA: Diagnosis not present

## 2020-04-29 DIAGNOSIS — Z0000 Encounter for general adult medical examination without abnormal findings: Secondary | ICD-10-CM | POA: Diagnosis not present

## 2020-04-29 DIAGNOSIS — I10 Essential (primary) hypertension: Secondary | ICD-10-CM

## 2020-04-29 DIAGNOSIS — Z6835 Body mass index (BMI) 35.0-35.9, adult: Secondary | ICD-10-CM

## 2020-04-29 DIAGNOSIS — E669 Obesity, unspecified: Secondary | ICD-10-CM

## 2020-04-29 DIAGNOSIS — Z833 Family history of diabetes mellitus: Secondary | ICD-10-CM

## 2020-04-29 DIAGNOSIS — K8020 Calculus of gallbladder without cholecystitis without obstruction: Secondary | ICD-10-CM

## 2020-04-29 DIAGNOSIS — Z130 Encounter for screening for diseases of the blood and blood-forming organs and certain disorders involving the immune mechanism: Secondary | ICD-10-CM

## 2020-04-29 DIAGNOSIS — M26609 Unspecified temporomandibular joint disorder, unspecified side: Secondary | ICD-10-CM

## 2020-04-29 DIAGNOSIS — E559 Vitamin D deficiency, unspecified: Secondary | ICD-10-CM | POA: Diagnosis not present

## 2020-04-29 DIAGNOSIS — Z79899 Other long term (current) drug therapy: Secondary | ICD-10-CM

## 2020-04-29 DIAGNOSIS — F32A Depression, unspecified: Secondary | ICD-10-CM

## 2020-04-29 MED ORDER — TOPIRAMATE 50 MG PO TABS: ORAL_TABLET | ORAL | 1 refills | Status: DC

## 2020-04-29 NOTE — Progress Notes (Signed)
Complete Physical  Assessment and Plan:  Merial was seen today for annual exam.  Diagnoses and all orders for this visit:  Encounter for routine adult health examination with abnormal findings   Essential hypertension Continue medication: verapmil for headaches/BP, consider adding hctz if BPs persistently above goal  Monitor blood pressure at home; call if consistently over 130/80 Continue DASH diet.   Reminder to go to the ER if any CP, SOB, nausea, dizziness, severe HA, changes vision/speech, left arm numbness and tingling and jaw pain. -     CBC with Diff -     COMPLETE METABOLIC PANEL WITH GFR -     Magnesium -     TSH -     Urinalysis, Routine w reflex microscopic -     EKG 12-Lead  Other migraine without status migrainosus, not intractable Recently improved; continue verapmil; headaches resolve with OTC NSAIDs  TMJ (temporomandibular joint syndrome) Improved with lifestyle; follows dentist; flexeril PRN is helpful if needed  Obesity Long discussion about weight loss, diet, and exercise Discussed goal weight and initial weight loss goal (<200lb) Patient will work on portions, increase exercise Patient on phentermine/topamax with benefit and no SE, taking drug breaks; continue close follow up.        -     phentermine (ADIPEX-P) 37.5 MG tablet; Take 1/2 to 1 tablet every morning as needed for dieting & weight loss. Take regular drug breaks. -     Hemoglobin A1c ([facility])  Depression, unspecified depression type She reports is doing fairly; would like to taper off of wellbutrin due to lack of perceived benefit; suggested 1 tab x 2 weeks then stop If having problems with mood may benefit more from SSRI due to anxious tendencies Lifestyle discussed: diet/exerise, sleep hygiene, stress management, hydration  Vitamin D deficiency -     Vitamin D (25 hydroxy)  Cholelithiasis/ Liver enzyme elevation Low fat diet encouraged; deferring due to insurance -     COMPLETE  METABOLIC PANEL WITH GFR  Screening for diabetes mellitus -     Hemoglobin A1c ([facility])  Screening cholesterol level -     Lipid Profile  Screening for thyroid disorder -     TSH  Discussed med's effects and SE's. Screening labs and tests as requested with regular follow-up as recommended. Over 40 minutes of exam, counseling, chart review, and complex, high level critical decision making was performed this visit.   Future Appointments  Date Time Provider Department Center  05/02/2021  3:00 PM Judd Gaudier, NP [facility] None    HPI  40 y.o. female  presents for a complete physical and follow up for has Essential hypertension; Migraine; TMJ (temporomandibular joint syndrome); Obesity (BMI 30.0-34.9); Vitamin D deficiency; Liver enzyme elevation; Cholelithiases; and Depression on their problem list.   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   Lots of stress with work, stress with special needs kids, some marital stress -no benefit with wellbutrin 150 mg, lexapro 20 mg, buspirone 5 mg TID (sedation), Effexor. Doesn't want to try any further, just a difficult life situation, discussed therapy but has been declining.   Follows with GYN Pryor Ochoa, MD for GYN last in 01/2020.   Migraine - remote; recently with frontal headaches only, resolve with aleve quickly. She does have TMJ, has done  flexeril PRN in the past which has been helpful  She has had mild persistent LFT elevation, had negative hepatitis panel 03/17/2019, abd US showed normal liver/bile duct but with numerous cholelithiasis ~43mm without appearance of cholecystitis on 04/01/2019. Will likely require cholecystectomy at some point but trying to postpone until better  insurance and life is more stable. Reports occasional epigastric discomfort. She had bil arm persistent itching without rash, derm attempted several topicals without improvement, some question if this was r/t above and resolved with a few weeks on colestipol.    she is prescribed phentermine and topiramate for weight loss. She takes phentermine whole tab 2-3 tabs per week. While on the medication they have lost 1 lbs since last visit. Denies palpitations, anxiety, trouble sleeping, elevated BP.   BMI is Body mass index is 33.28 kg/m., she has been working on diet and exercise. [place] bike when has time, admits has been baking.  Wt Readings from Last 3 Encounters:  04/29/20 200 lb (90.7 kg)  01/08/20 196 lb 12.8 oz (89.3 kg)  10/02/19 197 lb (89.4 kg)   She has not been checking BP at home, does have cuff, today their BP is BP: 110/76 She does workout. She denies chest pain, shortness of breath, dizziness. Reports occasional fluttering, 5 sec at rest, intermittent.     She is not on cholesterol medication. Her cholesterol is at goal. The cholesterol last visit was:   Lab Results  Component Value Date   CHOL 178 03/17/2019   HDL 64 03/17/2019   LDLCALC 87 03/17/2019   TRIG 169 (H) 03/17/2019   CHOLHDL 2.8 03/17/2019   She has been working on diet and exercise for glucose management. Last A1C in the office was:  Lab Results  Component Value Date   HGBA1C 5.4 03/17/2019   Last GFR: Lab Results  Component Value Date   GFRAA 117 03/17/2019   Patient is currently on Vitamin D, taking 5000 IU daily:    Lab Results  Component Value Date   VD25OH 14 (L) 03/17/2019        Current Medications:  Current Outpatient Medications on File Prior to Visit  Medication Sig Dispense Refill  . Cholecalciferol (VITAMIN D) 125 MCG (5000 UT) CAPS Take by mouth daily.    . Multiple Vitamins-Minerals (MULTIVITAMIN ADULT PO) Take by mouth daily.    . phentermine (ADIPEX-P) 37.5 MG tablet TAKE 1/2 TO  1 TABLET BY MOUTH EVERY MORNING FOR DIETING AND WEIGHT LOSS 30 tablet 2  . verapamil (CALAN-SR) 240 MG CR tablet Take 1 tablet (240 mg total) by mouth at bedtime. 90 tablet 1  . topiramate (TOPAMAX) 50 MG tablet Take 1/2 to 1 tablet 2 x /day at Suppertime & Bedtime for Dieting & Weight Loss (Patient not taking: Reported on 04/29/2020) 180 tablet 1   No current facility-administered medications on file prior to visit.   Allergies:  Allergies  Allergen Reactions  . Adhesive [Tape]    Medical History:  She has Essential hypertension; Migraine; TMJ (temporomandibular joint syndrome); Obesity (BMI 30.0-34.9); Vitamin D deficiency; Liver enzyme elevation; Cholelithiases; and Depression on their problem list. Health Maintenance:   There is no immunization history for the selected administration types on file for this patient.  Tetanus: 2020 Flu vaccine: 2020, will [facility] Covid 19: 3/3, 2021, pfizer   LMP: Patient's last menstrual period was 04/21/2020. Pap: GYN following, DUE  MGM: due age 79  DEXA: n/a  Colonoscopy: due age 72 EGD: n/a  Last  Dental Exam: q29m, last 2021 Last Eye Exam: wears glasses, last 2021  Patient Care Team: Unk Pinto, MD as PCP - General (Internal Medicine) Paula Compton, MD as Consulting Physician (Obstetrics and Gynecology)  Surgical History:  She has a past surgical history that includes Nasal septum surgery; deviated septum (2004); and Cesarean section (N/A, 12/06/2018). Family History:  Herfamily history includes Asthma in her sister and sister; CVA in her maternal grandfather; Cancer in her paternal grandmother; Diabetes in her maternal grandmother and mother; Heart murmur in her son; Hyperlipidemia in her father; Hypertension in her brother, maternal grandmother, and mother; Mental illness in her maternal grandmother; Other in her son. Social History:  She reports that she has never smoked. She has never used smokeless tobacco. She reports  current alcohol use of about 1.0 standard drink of alcohol per week. She reports that she does not use drugs.  Review of Systems: Review of Systems  Constitutional: Negative for malaise/fatigue and weight loss.  HENT: Negative for hearing loss and tinnitus.   Eyes: Negative for blurred vision and double vision.  Respiratory: Negative for cough, shortness of breath and wheezing.   Cardiovascular: Negative for chest pain, palpitations, orthopnea, claudication and leg swelling.  Gastrointestinal: Negative for abdominal pain, blood in stool, constipation, diarrhea, heartburn, melena, nausea and vomiting.  Genitourinary: Negative.   Musculoskeletal: Negative for joint pain and myalgias.  Skin: Negative for rash.  Neurological: Positive for headaches (occasional migraines). Negative for dizziness, tingling, sensory change and weakness.  Endo/Heme/Allergies: Negative for polydipsia.  Psychiatric/Behavioral: Negative for depression, substance abuse and suicidal ideas. The patient is not nervous/anxious and does not have insomnia (intermittent when stressed).   All other systems reviewed and are negative.   Physical Exam: Estimated body mass index is 33.28 kg/m as calculated from the following:   Height as of this encounter: 5\' 5"  (1.651 m).   Weight as of this encounter: 200 lb (90.7 kg). BP 110/76   Pulse 83   Temp (!) 97.2 F (36.2 C)   Ht 5\' 5"  (1.651 m)   Wt 200 lb (90.7 kg)   LMP 04/21/2020   SpO2 99%   BMI 33.28 kg/m  General Appearance: Well nourished, in no apparent distress.  Eyes: PERRLA, EOMs, conjunctiva no swelling or erythema Sinuses: No Frontal/maxillary tenderness  ENT/Mouth: Ext aud canals clear, normal light reflex with TMs without erythema, bulging. Good dentition. No erythema, swelling, or exudate on post pharynx. Tonsils not swollen or erythematous. Hearing normal.  Neck: Supple, thyroid normal. No bruits  Respiratory: Respiratory effort normal, BS equal  bilaterally without rales, rhonchi, wheezing or stridor.  Cardio: RRR without murmurs, rubs or gallops. Brisk peripheral pulses without edema.  Chest: symmetric, with normal excursions and percussion.  Breasts: Defer to GYN Abdomen: Soft, nontender, no guarding, rebound, hernias, masses, or organomegaly.  Lymphatics: Non tender without lymphadenopathy.  Genitourinary: Defer to GYN  Musculoskeletal: Full ROM all peripheral extremities,5/5 strength, and normal gait.  Skin: Warm, dry without rashes, lesions, ecchymosis. Neuro: Cranial nerves intact, reflexes equal bilaterally. Normal muscle tone, no cerebellar symptoms. Sensation intact.  Psych: Awake and oriented X 3, normal affect, Insight and Judgment appropriate.   EKG: WNL no ST changes in 2020 reviewed; defer.   Gorden Harms Jessey Huyett 4:10 PM [facility]

## 2020-04-29 NOTE — Patient Instructions (Addendum)
Kimberly Howard , Thank you for taking time to come for your Annual Wellness Visit. I appreciate your ongoing commitment to your health goals. Please review the following plan we discussed and let me know if I can assist you in the future.   These are the goals we discussed: Goals    . Blood Pressure < 130/80    . DIET - INCREASE WATER INTAKE     80+ fluid ounces daily     . Exercise 150 min/wk Moderate Activity    . Weight (lb) < 190 lb (86.2 kg)     Keep journal to track habits and progress Aim for 0.5-2 lb / week  Aim to make small sustainable changes       This is a list of the screening recommended for you and due dates:  Health Maintenance  Topic Date Due  . COVID-19 Vaccine (1) Never done  . Pap Smear  03/11/2018  . Flu Shot  11/23/2019  . Tetanus Vaccine  09/29/2028  .  Hepatitis C: One time screening is recommended by Center for Disease Control  (CDC) for  adults born from [birth year] through 1963.   Completed  . HIV Screening  Completed     Know what a healthy weight is for you (roughly BMI <25) and aim to maintain this  Aim for 7+ servings of fruits and vegetables daily  65-80+ fluid ounces of water or unsweet tea for healthy kidneys  Limit to max 1 drink of alcohol per day; avoid smoking/tobacco  Limit animal fats in diet for cholesterol and heart health - choose grass fed whenever available  Avoid highly processed foods, and foods high in saturated/trans fats  Aim for low stress - take time to unwind and care for your mental health  Aim for 150 min of moderate intensity exercise weekly for heart health, and weights twice weekly for bone health  Aim for 7-9 hours of sleep daily      High-Fiber Diet Fiber, also called dietary fiber, is a type of carbohydrate that is found in fruits, vegetables, whole grains, and beans. A high-fiber diet can have many health benefits. Your health care provider may recommend a high-fiber diet to help:  Prevent constipation. Fiber  can make your bowel movements more regular.  Lower your cholesterol.  Relieve the following conditions: ? Swelling of veins in the anus (hemorrhoids). ? Swelling and irritation (inflammation) of specific areas of the digestive tract (uncomplicated diverticulosis). ? A problem of the large intestine (colon) that sometimes causes pain and diarrhea (irritable bowel syndrome, IBS).  Prevent overeating as part of a weight-loss plan.  Prevent heart disease, type 2 diabetes, and certain cancers. What is my plan? The recommended daily fiber intake in grams (g) includes:  38 g for men age 22 or younger.  30 g for men over age 79.  25 g for women age 80 or younger.  38 g for women over age 49. You can get the recommended daily intake of dietary fiber by:  Eating a variety of fruits, vegetables, grains, and beans.  Taking a fiber supplement, if it is not possible to get enough fiber through your diet. What do I need to know about a high-fiber diet?  It is better to get fiber through food sources rather than from fiber supplements. There is not a lot of research about how effective supplements are.  Always check the fiber content on the nutrition facts label of any prepackaged food. Look for foods that  contain 5 g of fiber or more per serving.  Talk with a diet and nutrition specialist (dietitian) if you have questions about specific foods that are recommended or not recommended for your medical condition, especially if those foods are not listed below.  Gradually increase how much fiber you consume. If you increase your intake of dietary fiber too quickly, you may have bloating, cramping, or gas.  Drink plenty of water. Water helps you to digest fiber. What are tips for following this plan?  Eat a wide variety of high-fiber foods.  Make sure that half of the grains that you eat each day are whole grains.  Eat breads and cereals that are made with whole-grain flour instead of refined  flour or white flour.  Eat brown rice, bulgur wheat, or millet instead of white rice.  Start the day with a breakfast that is high in fiber, such as a cereal that contains 5 g of fiber or more per serving.  Use beans in place of meat in soups, salads, and pasta dishes.  Eat high-fiber snacks, such as berries, raw vegetables, nuts, and popcorn.  Choose whole fruits and vegetables instead of processed forms like juice or sauce. What foods can I eat?  Fruits Berries. Pears. Apples. Oranges. Avocado. Prunes and raisins. Dried figs. Vegetables Sweet potatoes. Spinach. Kale. Artichokes. Cabbage. Broccoli. Cauliflower. Green peas. Carrots. Squash. Grains Whole-grain breads. Multigrain cereal. Oats and oatmeal. Brown rice. Barley. Bulgur wheat. Millet. Quinoa. Bran muffins. Popcorn. Rye wafer crackers. Meats and other proteins Navy, kidney, and pinto beans. Soybeans. Split peas. Lentils. Nuts and seeds. Dairy Fiber-fortified yogurt. Beverages Fiber-fortified soy milk. Fiber-fortified orange juice. Other foods Fiber bars. The items listed above may not be a complete list of recommended foods and beverages. Contact a dietitian for more options. What foods are not recommended? Fruits Fruit juice. Cooked, strained fruit. Vegetables Fried potatoes. Canned vegetables. Well-cooked vegetables. Grains White bread. Pasta made with refined flour. White rice. Meats and other proteins Fatty cuts of meat. Fried chicken or fried fish. Dairy Milk. Yogurt. Cream cheese. Sour cream. Fats and oils Butters. Beverages Soft drinks. Other foods Cakes and pastries. The items listed above may not be a complete list of foods and beverages to avoid. Contact a dietitian for more information. Summary  Fiber is a type of carbohydrate. It is found in fruits, vegetables, whole grains, and beans.  There are many health benefits of eating a high-fiber diet, such as preventing constipation, lowering blood  cholesterol, helping with weight loss, and reducing your risk of heart disease, diabetes, and certain cancers.  Gradually increase your intake of fiber. Increasing too fast can result in cramping, bloating, and gas. Drink plenty of water while you increase your fiber.  The best sources of fiber include whole fruits and vegetables, whole grains, nuts, seeds, and beans. This information is not intended to replace advice given to you by your health care provider. Make sure you discuss any questions you have with your health care provider. Document Revised: 02/12/2017 Document Reviewed: 02/12/2017 Elsevier Patient Education  2020 [employer].

## 2020-04-30 LAB — URINALYSIS, ROUTINE W REFLEX MICROSCOPIC
Bacteria, UA: NONE SEEN /HPF
Bilirubin Urine: NEGATIVE
Glucose, UA: NEGATIVE
Hgb urine dipstick: NEGATIVE
Hyaline Cast: NONE SEEN /LPF
Ketones, ur: NEGATIVE
Nitrite: NEGATIVE
Protein, ur: NEGATIVE
RBC / HPF: NONE SEEN /HPF (ref 0–2)
Specific Gravity, Urine: 1.012 (ref 1.001–1.03)
Squamous Epithelial / HPF: NONE SEEN /HPF (ref <=5)
pH: 6.5 (ref 5.0–8.0)

## 2020-04-30 LAB — HEMOGLOBIN A1C
Hgb A1c MFr Bld: 5.4 % of total Hgb (ref <5.7)
Mean Plasma Glucose: 108 mg/dL
eAG (mmol/L): 6 mmol/L

## 2020-04-30 LAB — CBC WITH DIFFERENTIAL/PLATELET
Absolute Monocytes: 656 cells/uL (ref 200–950)
Basophils Absolute: 50 cells/uL (ref 0–200)
Basophils Relative: 0.6 %
Eosinophils Absolute: 116 cells/uL (ref 15–500)
Eosinophils Relative: 1.4 %
HCT: 38.9 % (ref 35.0–45.0)
Hemoglobin: 12.9 g/dL (ref 11.7–15.5)
Lymphs Abs: 3503 cells/uL (ref 850–3900)
MCH: 30.4 pg (ref 27.0–33.0)
MCHC: 33.2 g/dL (ref 32.0–36.0)
MCV: 91.5 fL (ref 80.0–100.0)
MPV: 11.8 fL (ref 7.5–12.5)
Monocytes Relative: 7.9 %
Neutro Abs: 3976 cells/uL (ref 1500–7800)
Neutrophils Relative %: 47.9 %
Platelets: 266 10*3/uL (ref 140–400)
RBC: 4.25 10*6/uL (ref 3.80–5.10)
RDW: 12.1 % (ref 11.0–15.0)
Total Lymphocyte: 42.2 %
WBC: 8.3 10*3/uL (ref 3.8–10.8)

## 2020-04-30 LAB — VITAMIN D 25 HYDROXY (VIT D DEFICIENCY, FRACTURES): Vit D, 25-Hydroxy: 17 ng/mL — ABNORMAL LOW (ref 30–100)

## 2020-04-30 LAB — COMPLETE METABOLIC PANEL WITH GFR
AG Ratio: 1.5 (calc) (ref 1.0–2.5)
ALT: 20 U/L (ref 6–29)
AST: 14 U/L (ref 10–30)
Albumin: 4.7 g/dL (ref 3.6–5.1)
Alkaline phosphatase (APISO): 54 U/L (ref 31–125)
BUN: 11 mg/dL (ref 7–25)
CO2: 25 mmol/L (ref 20–32)
Calcium: 9.6 mg/dL (ref 8.6–10.2)
Chloride: 108 mmol/L (ref 98–110)
Creat: 0.81 mg/dL (ref 0.50–1.10)
GFR, Est African American: 108 mL/min/{1.73_m2} (ref >=60)
GFR, Est Non African American: 93 mL/min/{1.73_m2} (ref >=60)
Globulin: 3.1 g/dL (calc) (ref 1.9–3.7)
Glucose, Bld: 91 mg/dL (ref 65–99)
Potassium: 3.9 mmol/L (ref 3.5–5.3)
Sodium: 140 mmol/L (ref 135–146)
Total Bilirubin: 0.3 mg/dL (ref 0.2–1.2)
Total Protein: 7.8 g/dL (ref 6.1–8.1)

## 2020-04-30 LAB — TSH: TSH: 1.39 mIU/L

## 2020-04-30 LAB — INSULIN, RANDOM: Insulin: 6.6 u[IU]/mL

## 2020-04-30 LAB — VITAMIN B12: Vitamin B-12: 487 pg/mL (ref 200–1100)

## 2020-04-30 LAB — MAGNESIUM: Magnesium: 2 mg/dL (ref 1.5–2.5)

## 2020-05-19 ENCOUNTER — Encounter: Payer: Self-pay | Admitting: Internal Medicine

## 2020-06-24 NOTE — Progress Notes (Signed)
Virtual Visit via Telephone Note  I connected with Kimberly Howard on 06/25/20 at 10:30 AM EST by telephone and verified that I am speaking with the correct person using two identifiers.  Location: Patient: home Provider: [place] office    I discussed the limitations, risks, security and privacy concerns of performing an evaluation and management service by telephone and the availability of in person appointments. I also discussed with the patient that there may be a patient responsible charge related to this service. The patient expressed understanding and agreed to proceed.   History of Present Illness:  LMP 06/11/2020 (Exact Date)   40 y.o. female with hx of depression, concern for possible ADD contributing.   Lots of stress with work, stress with special needs kids, some marital stress -no benefit with wellbutrin 150 mg, lexapro 20 mg, buspirone 5 mg TID (sedation), Effexor.   She reports son was dx with severe ADHD/OCD, realized as she has been reading that she likely had problems as a teen, ongoing depression/mood issues that haven't responded well to first line mood agents, has noted ongoing difficulty with focus, maintaining routines, and completing necessary tasks, frequently struggles to start or complete complex tasks. She did do fairly well in school, struggled with papers and completing work, but did well enough on tests, particularly multiple choice.   Recommended ADD/ADHD testing, looked locally but unable to get in until after April.     Allergies:  Allergies  Allergen Reactions  . Adhesive [Tape]    Medical History:  has Essential hypertension; Migraine; TMJ (temporomandibular joint syndrome); Obesity (BMI 30.0-34.9); Vitamin D deficiency; Liver enzyme elevation; Cholelithiases; Depression; and Attention deficit disorder on their problem list. Surgical History:  She  has a past surgical history that includes Nasal septum surgery; deviated septum (2004); and Cesarean  section (N/A, 12/06/2018). Family History:  Herfamily history includes Asthma in her sister and sister; CVA in her maternal grandfather; Cancer in her paternal grandmother; Diabetes in her maternal grandmother and mother; Heart murmur in her son; Hyperlipidemia in her father; Hypertension in her brother, maternal grandmother, and mother; Mental illness in her maternal grandmother; Other in her son. Social History:   reports that she has never smoked. She has never used smokeless tobacco. She reports current alcohol use of about 1.0 standard drink of alcohol per week. She reports that she does not use drugs.     Observations/Objective:  LMP 06/11/2020 (Exact Date)   General : Well sounding patient in no apparent distress HEENT: no hoarseness, no cough for duration of visit Lungs: speaks in complete sentences, no audible wheezing, no apparent distress Neurological: alert, oriented x 3 Psychiatric: pleasant, judgement appropriate   Assessment and Plan:   Kimberly Howard was seen today for focus and mood.  Diagnoses and all orders for this visit:  Attention deficit disorder, unspecified hyperactivity presence Significant + on ASRS, will try low dose adderall  PDMP reviewed; stop phentermine;  The patient was counseled on the addictive nature of the medication and was encouraged to take drug holidays when not needed.  Follow up in 1 month or sooner as needed -     amphetamine-dextroamphetamine (ADDERALL) 10 MG tablet; Take 1/2-1 tab twice daily as needed for focus. Avoid taking daily to prevent tolerance.   Follow Up Instructions:    I discussed the assessment and treatment plan with the patient. The patient was provided an opportunity to ask questions and all were answered. The patient agreed with the plan and demonstrated an understanding of  the instructions.   The patient was advised to call back or seek an in-person evaluation if the symptoms worsen or if the condition fails to improve as  anticipated.  I provided 20 minutes of non-face-to-face time during this encounter.   Izora Ribas, NP  Future Appointments  Date Time Provider [place]  10/28/2020  4:00 PM Liane Comber, NP [facility] None  05/02/2021  3:00 PM Liane Comber, NP [facility] None

## 2020-06-25 ENCOUNTER — Ambulatory Visit: Payer: PRIVATE HEALTH INSURANCE | Admitting: Adult Health

## 2020-06-25 ENCOUNTER — Other Ambulatory Visit: Payer: Self-pay

## 2020-06-25 ENCOUNTER — Encounter: Payer: Self-pay | Admitting: Adult Health

## 2020-06-25 DIAGNOSIS — F988 Other specified behavioral and emotional disorders with onset usually occurring in childhood and adolescence: Secondary | ICD-10-CM

## 2020-06-25 HISTORY — DX: Other specified behavioral and emotional disorders with onset usually occurring in childhood and adolescence: F98.8

## 2020-06-25 MED ORDER — AMPHETAMINE-DEXTROAMPHETAMINE 10 MG PO TABS: ORAL_TABLET | ORAL | 0 refills | Status: DC

## 2020-06-28 ENCOUNTER — Encounter: Payer: Self-pay | Admitting: Internal Medicine

## 2020-10-28 ENCOUNTER — Other Ambulatory Visit: Payer: Self-pay

## 2020-10-28 ENCOUNTER — Encounter: Payer: Self-pay | Admitting: Adult Health

## 2020-10-28 ENCOUNTER — Ambulatory Visit: Payer: PRIVATE HEALTH INSURANCE | Admitting: Adult Health

## 2020-10-28 VITALS — BP 118/84 | HR 86 | Temp 97.5°F | Wt 217.0 lb

## 2020-10-28 DIAGNOSIS — R748 Abnormal levels of other serum enzymes: Secondary | ICD-10-CM

## 2020-10-28 DIAGNOSIS — F32A Depression, unspecified: Secondary | ICD-10-CM | POA: Diagnosis not present

## 2020-10-28 DIAGNOSIS — I10 Essential (primary) hypertension: Secondary | ICD-10-CM

## 2020-10-28 DIAGNOSIS — K8020 Calculus of gallbladder without cholecystitis without obstruction: Secondary | ICD-10-CM | POA: Diagnosis not present

## 2020-10-28 DIAGNOSIS — G43809 Other migraine, not intractable, without status migrainosus: Secondary | ICD-10-CM

## 2020-10-28 DIAGNOSIS — E669 Obesity, unspecified: Secondary | ICD-10-CM

## 2020-10-28 DIAGNOSIS — E559 Vitamin D deficiency, unspecified: Secondary | ICD-10-CM

## 2020-10-28 MED ORDER — PHENTERMINE HCL 37.5 MG PO TABS: ORAL_TABLET | ORAL | 2 refills | Status: DC

## 2020-10-28 MED ORDER — VERAPAMIL HCL ER 240 MG PO TBCR: 240.0000 mg | EXTENDED_RELEASE_TABLET | Freq: Every day | ORAL | 1 refills | Status: DC

## 2020-10-28 NOTE — Progress Notes (Signed)
Assessment and Plan:  Kimberly Howard was seen today for follow-up.  Diagnoses and all orders for this visit:  Obesity  Weight up off of phentermine; will restart Initial weight loss goal <200 lb Continue with diet and exercise efforts; try to find exercise that she enjoys or pair with social activity discussed high fiber/minimally processed whole foods Keep log Weigh weekly - goal 0.5-2 lb/week Follow up in 3 months   Essential hypertension Well controlled; Continue medication Monitor blood pressure at home; call if consistently over 130/80 Continue DASH diet.   Reminder to go to the ER if any CP, SOB, nausea, dizziness, severe HA, changes vision/speech, left arm numbness and tingling and jaw pain.  Calculus of gallbladder without cholecystitis without obstruction Mild intermittent sx, avoiding fatty foods, lifestyle modification  Have discussed elective lap chole; she has been deferring due to insurance and young child but requests referral to discuss with surgeon today.   Depression/ADD Poor response with wellbutrin, lexapro, buspar, effexor Benefit with adderall for focus, but prefers to switch back to phentermine for weight loss benefit Many stressors due to marriage Likely would benefit from CBT/marriage counseling, continue to discuss Lifestyle discussed: diet/exerise, sleep hygiene, stress management, hydration   No labs this visit.   Discussed med's effects and SE's.   Over 30 minutes of exam, counseling, chart review, and critical decision making was performed.   Future Appointments  Date Time Provider [place]  05/02/2021  3:00 PM Liane Comber, NP [facility] None    ------------------------------------------------------------------------------------------------------------------  HPI 40 y.o.female presents for 6 month follow up for obesity, htn monitoring, depression/ADD. 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   Depression - she has some stress r/t pandemic and kids, marriage struggles, no benefit with wellbutrin 150 mg, lexapro 20 mg, buspirone 5 mg TID (sedation), effexor. "I think this is just me." There was question of possible ADD per in office screening and trial of adderall 10 mg, takes 1/2-1 tab occasionally with benefit, but weight gain since off of phentermine, would like to restart.   She has had mild persistent LFT elevation, had negative hepatitis panel 03/17/2019, abd US showed normal liver/bile duct but with numerous cholelithiasis ~26mm without appearance of cholecystitis on 04/01/2019. Have discussed likely need cholecystectomy at some point but have been postponing due to young children and weight restrictions would be difficult/also financial burden with insurance. Reports occasional epigastric discomfort/nausea. Ready for referral to discuss.   Her blood pressure has been controlled at home, today their BP is BP: 118/84  She does workout. She denies chest pain, shortness of breath, dizziness.  BMI is Body mass index is 36.11 kg/m.  Admits diet/exercise not as good as she needs due to young children. Off of phentermine due to adderall, continues with topiramate for migraines and weight loss. Up 17 lb in last 6 months. 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   Cholecalciferol (VITAMIN D) 125 MCG (5000 UT) CAPS Take by mouth daily.   Multiple Vitamins-Minerals (MULTIVITAMIN ADULT PO) Take by mouth daily.   topiramate (TOPAMAX) 50 MG tablet Take 1/2 to 1 tablet 2 x /day at Suppertime & Bedtime for Dieting & Weight Loss   verapamil (CALAN-SR) 240 MG CR tablet Take 1 tablet (240 mg total) by mouth at bedtime.   Zinc 50 MG CAPS Take by mouth daily.   amphetamine-dextroamphetamine (ADDERALL) 10 MG tablet Take 1/2-1 tab twice daily as needed for focus. Avoid taking daily to prevent tolerance. (Patient not taking: Reported on 10/28/2020)   No current facility-administered medications on file prior to visit.    ROS: all negative except above.   Physical Exam:  BP 118/84   Pulse 86   Temp (!) 97.5 F (36.4 C)   Wt 217 lb (98.4 kg)   SpO2 97%   BMI 36.11 kg/m   General Appearance: Well nourished,obese AA female in no apparent distress. Eyes: conjunctiva no swelling or erythema ENT/Mouth: Mask in place; Hearing normal.  Neck: Supple, thyroid normal.  Respiratory: Respiratory effort normal, BS equal bilaterally without rales, rhonchi, wheezing or stridor.  Cardio: RRR with no MRGs. Brisk peripheral pulses without edema.  Abdomen: Soft obese obdomen, + BS.  Non tender. No palpable mass or organomegaly.  Lymphatics: Non tender without lymphadenopathy.  Musculoskeletal: No obvious deformity, normal gait.  Skin: Warm, dry without rashes, lesions, ecchymosis.  Neuro: Normal muscle tone, no cerebellar symptoms.  Psych: Awake and oriented X 3, normal affect, Insight and Judgment appropriate.     Izora Ribas, NP 4:30 PM [facility]

## 2020-11-23 ENCOUNTER — Other Ambulatory Visit: Payer: Self-pay | Admitting: Adult Health

## 2020-11-23 MED ORDER — SERTRALINE HCL 50 MG PO TABS: 50.0000 mg | ORAL_TABLET | Freq: Every day | ORAL | 0 refills | Status: DC

## 2020-11-23 MED ORDER — CITALOPRAM HYDROBROMIDE 20 MG PO TABS: 20.0000 mg | ORAL_TABLET | Freq: Every day | ORAL | 0 refills | Status: DC

## 2021-02-03 IMAGING — US US AXILLARY RIGHT
1 series · 6 of 6 positions shown · non-contrast
Comparison: None.

CLINICAL DATA: Patient describes new focal pain within the RIGHT
axilla. Patient describes a palpable lump/thickening within this
area of the RIGHT axilla for many years.

EXAM:
ULTRASOUND OF THE RIGHT AXILLA

[Series 1: us axillary right · 0.07mm/px · 6 of 6 slices shown]
[im 1/6]
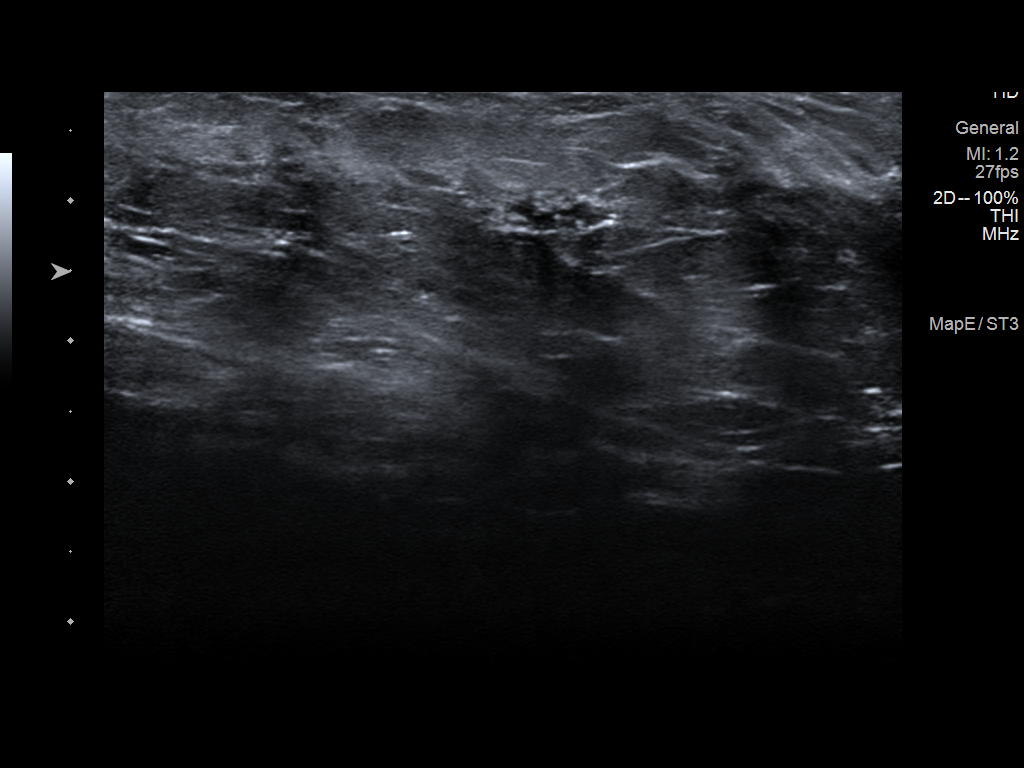
[im 2/6]
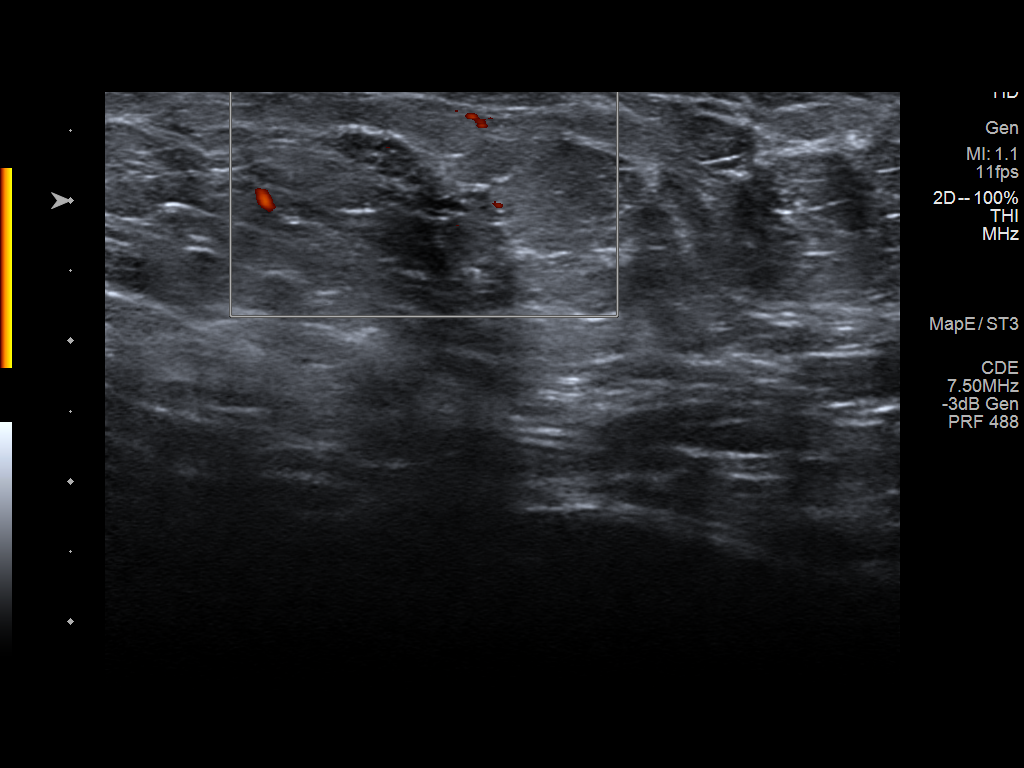
[im 3/6]
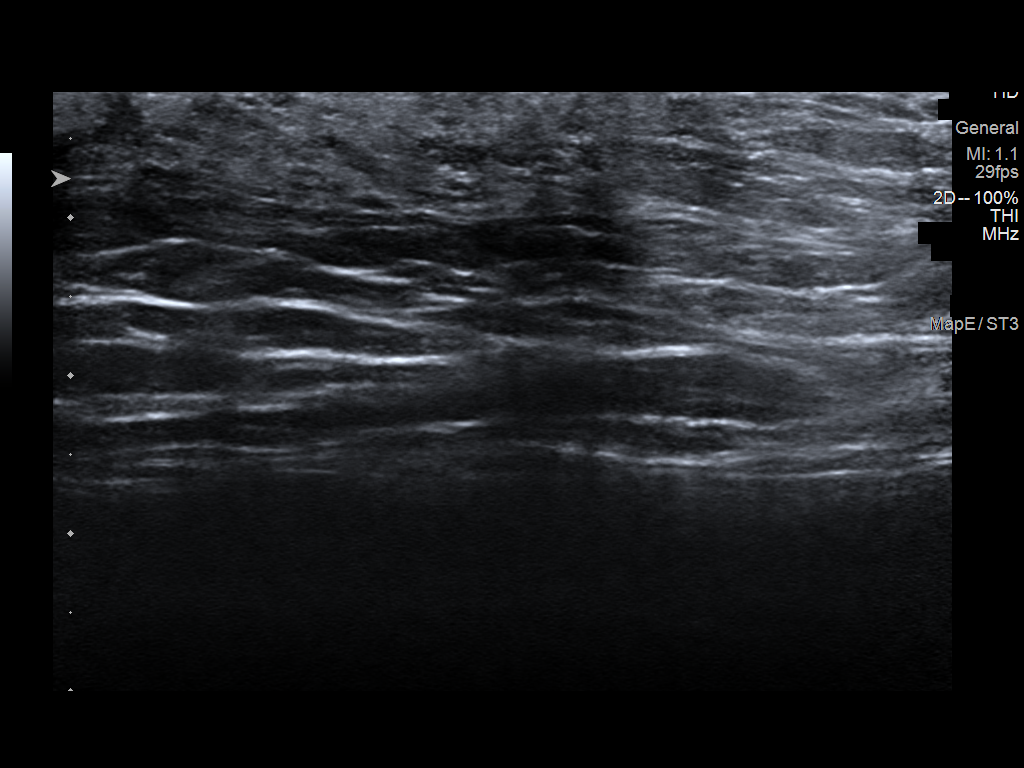
[im 4/6]
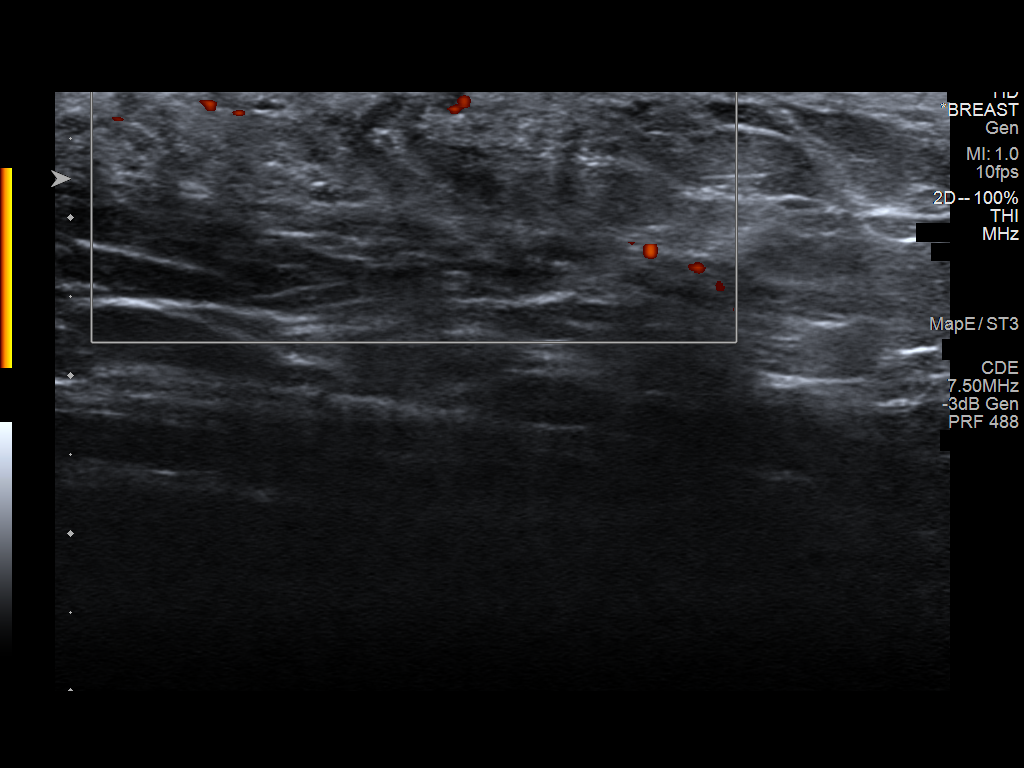
[im 5/6]
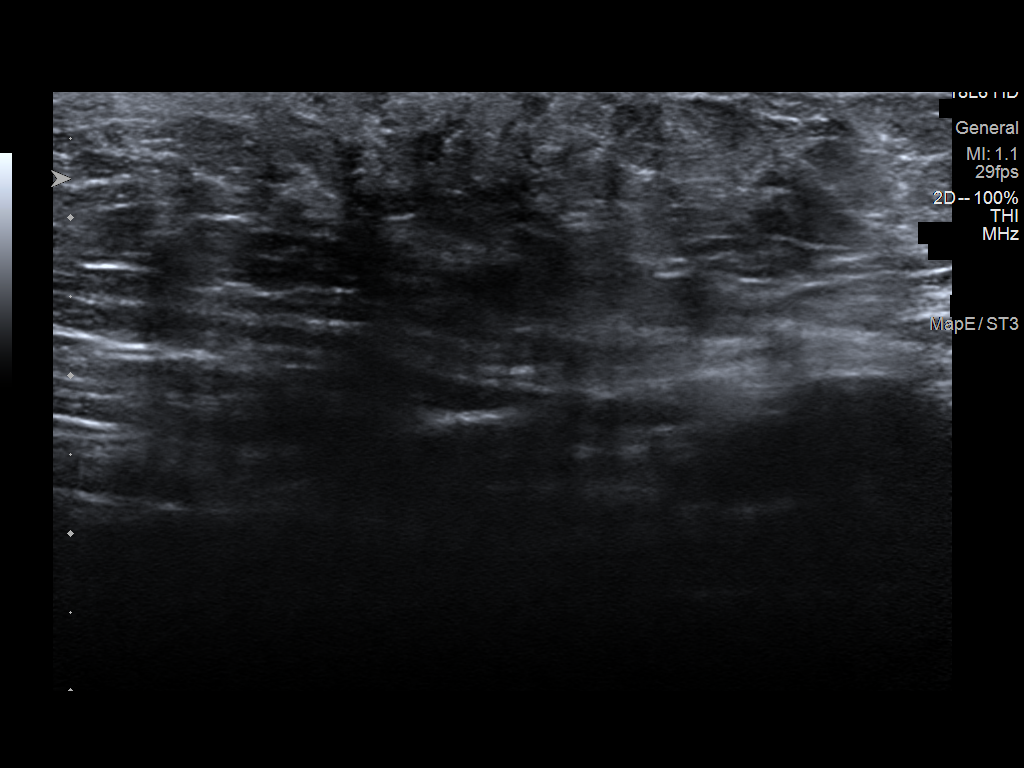
[im 6/6]
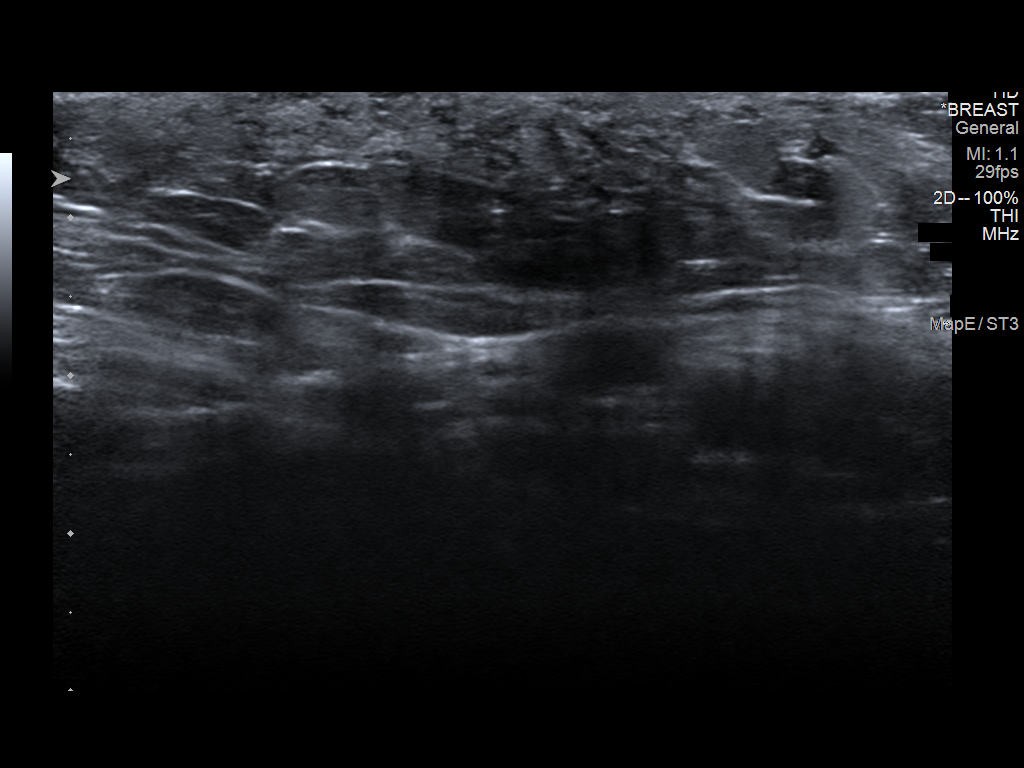

[6 of 6 positions shown; findings below may reference images not displayed]

FINDINGS: On physical exam, there is vague thickening within the RIGHT axilla
without evidence of circumscribed mass.

Targeted ultrasound is performed, evaluating the RIGHT axilla as
directed by the patient, showing only normal soft tissues. There are
normal fibroglandular tissues and fat lobules consistent with normal
accessory breast tissue. No solid or cystic mass is identified. No
enlarged lymph nodes.
IMPRESSION: No evidence of malignancy or acute findings within the RIGHT axilla.

RECOMMENDATION:
1. Screening mammogram at age 40 unless there are persistent or
intervening clinical concerns. (Code:[id number]-J-V8Q)
2. The patient was instructed to return sooner if the area that she
feels becomes larger and/or firmer to palpation, or if a new
palpable abnormality is identified in either breast.

I have discussed the findings and recommendations with the patient.
Results were also provided in writing at the conclusion of the
visit. If applicable, a reminder letter will be sent to the patient
regarding the next appointment.

BI-RADS CATEGORY  1: Negative.

## 2021-02-07 ENCOUNTER — Ambulatory Visit: Payer: No Typology Code available for payment source | Admitting: Adult Health

## 2021-02-16 ENCOUNTER — Other Ambulatory Visit: Payer: Self-pay | Admitting: Adult Health

## 2021-02-16 DIAGNOSIS — Z6835 Body mass index (BMI) 35.0-35.9, adult: Secondary | ICD-10-CM

## 2021-02-19 ENCOUNTER — Other Ambulatory Visit: Payer: Self-pay | Admitting: Adult Health

## 2021-02-24 NOTE — Progress Notes (Signed)
Assessment and Plan:  Kimberly Howard was seen today for follow-up.  Diagnoses and all orders for this visit:  Obesity  Doing better with phentermine/topamax Initial weight loss goal <200 lb Continue with diet and exercise efforts; try to find exercise that she enjoys or pair with social activity discussed high fiber/minimally processed whole foods Keep log Weigh weekly - goal 0.5-2 lb/week Follow up in 3 months   Essential hypertension Well controlled; Continue medication Monitor blood pressure at home; call if consistently over 130/80 Continue DASH diet.   Reminder to go to the ER if any CP, SOB, nausea, dizziness, severe HA, changes vision/speech, left arm numbness and tingling and jaw pain.  Calculus of gallbladder without cholecystitis without obstruction Mild intermittent sx, avoiding fatty foods, lifestyle modification  Have discussed elective lap chole; has upcoming planned  Depression/ADD Has noted benefit with low dose sertraline; taper to 75 mg x 1-2 weeks then 100 mg daily if tolerating Many stressors due to marriage/job Likely would benefit from CBT/marriage counseling, continue to discuss Lifestyle discussed: diet/exerise, sleep hygiene, stress management, hydration  LUQ abd pain/tenerness/constipation Recheck upper abd labs;  Add miralax daily If severe pain plan CT scan vs GI referral  Keep food log, slowly increase fiber in diet,  Increase fluid intake   Orders Placed This Encounter  Procedures   CBC with Differential/Platelet   COMPLETE METABOLIC PANEL WITH GFR   Urinalysis w microscopic + reflex cultur   Lipase   VITAMIN D 25 Hydroxy (Vit-D Deficiency, Fractures)     Discussed med's effects and SE's.   Over 30 minutes of exam, counseling, chart review, and critical decision making was performed.   Future Appointments  Date Time Provider [place]  05/10/2021  9:00 AM Liane Comber, NP [facility] None     ------------------------------------------------------------------------------------------------------------------  HPI 40 y.o.female presents for 6 month follow up for obesity, htn monitoring, depression/ADD. 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   "I think this is just me." There was question of possible ADD per in office screening and trial of adderall 10 mg, takes 1/2-1 tab occasionally with benefit, but weight gain since off of phentermine, preferred to switch back.   She has had mild persistent LFT elevation, had negative hepatitis panel 03/17/2019, abd US showed normal liver/bile duct but with numerous cholelithiasis ~80mm without appearance of cholecystitis on 04/01/2019. Have discussed likely need cholecystectomy at some point but have been postponing due to young children and weight restrictions was postponing but earlier this year was agreeable and was evaluated by gen surgery Dr. Greer Pickerel, planning lap chole   She reports has been having intermittent dull LUQ abd pain intermittent episodes, increasing frequency. Does also note more constipation recently which is typical.   Her blood pressure has been controlled at home, today their BP is BP: 110/76  She does workout. She denies chest pain, shortness of breath, dizziness.  BMI is Body mass index is 34.11 kg/m.  Admits diet/exercise not as good as she needs due to young children. Doing better, down 12 lb since restarting phentermine/topamax.   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   Cholecalciferol (VITAMIN D) 125 MCG (5000 UT) CAPS Take by mouth daily.   Multiple Vitamins-Minerals (MULTIVITAMIN ADULT PO) Take by mouth daily.   phentermine (ADIPEX-P) 37.5 MG tablet Take 1/2 to 1 tablet once daily in the  morning as needed for dieting/weight loss.   sertraline (ZOLOFT) 50 MG tablet TAKE ONE TABLET BY MOUTH DAILY   topiramate (TOPAMAX) 50 MG tablet TAKE ONE HALF TO ONE TABLET BY MOUTH TWICE DAILY AT SUPPER AND AT BEDTIME FOR DIETING AND WEIGHT LOSS   verapamil (CALAN-SR) 240 MG CR tablet Take 1 tablet (240 mg total) by mouth at bedtime.   Zinc 50 MG CAPS Take by mouth daily.   No current facility-administered medications on file prior to visit.    ROS: all negative except above.   Physical Exam:  BP 110/76   Pulse 81   Temp (!) 97.5 F (36.4 C)   Wt 205 lb (93 kg)   SpO2 99%   BMI 34.11 kg/m   General Appearance: Well nourished,obese AA female in no apparent distress. Eyes: conjunctiva no swelling or erythema ENT/Mouth: Mask in place; Hearing normal.  Neck: Supple, thyroid normal.  Respiratory: Respiratory effort normal, BS equal bilaterally without rales, rhonchi, wheezing or stridor.  Cardio: RRR with no MRGs. Brisk peripheral pulses without edema.  Abdomen: Soft obese obdomen, + BS.  Mild vague tenderness throughout. Neg murphy's. No palpable mass  or organomegaly.  Lymphatics: Non tender without lymphadenopathy.  Musculoskeletal: No obvious deformity, normal gait.  Skin: Warm, dry without rashes, lesions, ecchymosis.  Neuro: Normal muscle tone, no cerebellar symptoms.  Psych: Awake and oriented X 3, normal affect, Insight and Judgment appropriate.     Izora Ribas, NP 8:58 AM [facility]

## 2021-03-01 ENCOUNTER — Other Ambulatory Visit: Payer: Self-pay

## 2021-03-01 ENCOUNTER — Encounter: Payer: Self-pay | Admitting: Adult Health

## 2021-03-01 ENCOUNTER — Ambulatory Visit: Payer: No Typology Code available for payment source | Admitting: Adult Health

## 2021-03-01 VITALS — BP 110/76 | HR 81 | Temp 97.5°F | Wt 205.0 lb

## 2021-03-01 DIAGNOSIS — R10812 Left upper quadrant abdominal tenderness: Secondary | ICD-10-CM

## 2021-03-01 DIAGNOSIS — G43809 Other migraine, not intractable, without status migrainosus: Secondary | ICD-10-CM

## 2021-03-01 DIAGNOSIS — E559 Vitamin D deficiency, unspecified: Secondary | ICD-10-CM

## 2021-03-01 DIAGNOSIS — F988 Other specified behavioral and emotional disorders with onset usually occurring in childhood and adolescence: Secondary | ICD-10-CM

## 2021-03-01 DIAGNOSIS — E669 Obesity, unspecified: Secondary | ICD-10-CM

## 2021-03-01 DIAGNOSIS — Z23 Encounter for immunization: Secondary | ICD-10-CM | POA: Diagnosis not present

## 2021-03-01 DIAGNOSIS — I10 Essential (primary) hypertension: Secondary | ICD-10-CM

## 2021-03-01 DIAGNOSIS — K8020 Calculus of gallbladder without cholecystitis without obstruction: Secondary | ICD-10-CM

## 2021-03-01 MED ORDER — SERTRALINE HCL 100 MG PO TABS: 100.0000 mg | ORAL_TABLET | Freq: Every day | ORAL | 3 refills | Status: DC

## 2021-03-01 NOTE — Patient Instructions (Addendum)
Take miralax daily for good regular bowel movements - then   Add a soluble fiber supplement -  Add "Align" probiotic - 1-2 months   Slowly increase fiber intake in diet by 2-3 grams/week to goal of 30 g daily       Consider keeping a food diary- common causes of diarrhea are dairy, certain carbs...   FODMAP stands for fermentable oligo-, di-, mono-saccharides and polyols (1). These are the scientific terms used to classify groups of carbs that are notorious for triggering digestive symptoms like bloating, gas and stomach pain.   FODMAPs are found in a wide range of foods in varying amounts. Some foods contain just one type, while others contain several.  The main dietary sources of the four groups of FODMAPs include:  Oligosaccharides: Wheat, rye, legumes and various fruits and vegetables, such as garlic and onions.  Disaccharides: Milk, yogurt and soft cheese. Lactose is the main carb.  Monosaccharides: Various fruit including figs and mangoes, and sweeteners such as honey and agave nectar. Fructose is the main carb.  Polyols: Certain fruits and vegetables including blackberries and lychee, as well as some low-calorie sweeteners like those in sugar-free gum.   Keep a food diary. This will help you identify foods that cause symptoms. Write down: What you eat and when. What symptoms you have. When symptoms occur in relation to your meals. Avoid foods that cause symptoms. Talk with your dietitian about other ways to get the same nutrients that are in these foods. Eat your meals slowly, in a relaxed setting. Aim to eat 5-6 small meals per day. Do not skip meals. Drink enough fluids to keep your urine clear or pale yellow. If dairy products cause your symptoms to flare up, try eating less of them. You might be able to handle yogurt better than other dairy products because it contains bacteria that help with digestion.

## 2021-03-02 LAB — URINALYSIS W MICROSCOPIC + REFLEX CULTURE
Bacteria, UA: NONE SEEN /HPF
Bilirubin Urine: NEGATIVE
Glucose, UA: NEGATIVE
Hgb urine dipstick: NEGATIVE
Hyaline Cast: NONE SEEN /LPF
Ketones, ur: NEGATIVE
Leukocyte Esterase: NEGATIVE
Nitrites, Initial: NEGATIVE
Protein, ur: NEGATIVE
RBC / HPF: NONE SEEN /HPF (ref 0–2)
Specific Gravity, Urine: 1.021 (ref 1.001–1.035)
WBC, UA: NONE SEEN /HPF (ref 0–5)
pH: 7.5 (ref 5.0–8.0)

## 2021-03-02 LAB — CBC WITH DIFFERENTIAL/PLATELET
Absolute Monocytes: 573 cells/uL (ref 200–950)
Basophils Absolute: 50 cells/uL (ref 0–200)
Basophils Relative: 0.6 %
Eosinophils Absolute: 83 cells/uL (ref 15–500)
Eosinophils Relative: 1 %
HCT: 41.2 % (ref 35.0–45.0)
Hemoglobin: 13.2 g/dL (ref 11.7–15.5)
Lymphs Abs: 2639 cells/uL (ref 850–3900)
MCH: 29.7 pg (ref 27.0–33.0)
MCHC: 32 g/dL (ref 32.0–36.0)
MCV: 92.6 fL (ref 80.0–100.0)
MPV: 10.9 fL (ref 7.5–12.5)
Monocytes Relative: 6.9 %
Neutro Abs: 4955 cells/uL (ref 1500–7800)
Neutrophils Relative %: 59.7 %
Platelets: 290 10*3/uL (ref 140–400)
RBC: 4.45 10*6/uL (ref 3.80–5.10)
RDW: 12.1 % (ref 11.0–15.0)
Total Lymphocyte: 31.8 %
WBC: 8.3 10*3/uL (ref 3.8–10.8)

## 2021-03-02 LAB — COMPLETE METABOLIC PANEL WITH GFR
AG Ratio: 1.5 (calc) (ref 1.0–2.5)
ALT: 13 U/L (ref 6–29)
AST: 15 U/L (ref 10–30)
Albumin: 4.4 g/dL (ref 3.6–5.1)
Alkaline phosphatase (APISO): 46 U/L (ref 31–125)
BUN/Creatinine Ratio: 9 (calc) (ref 6–22)
BUN: 6 mg/dL — ABNORMAL LOW (ref 7–25)
CO2: 21 mmol/L (ref 20–32)
Calcium: 9.6 mg/dL (ref 8.6–10.2)
Chloride: 110 mmol/L (ref 98–110)
Creat: 0.68 mg/dL (ref 0.50–0.97)
Globulin: 3 g/dL (calc) (ref 1.9–3.7)
Glucose, Bld: 99 mg/dL (ref 65–99)
Potassium: 4.3 mmol/L (ref 3.5–5.3)
Sodium: 139 mmol/L (ref 135–146)
Total Bilirubin: 0.4 mg/dL (ref 0.2–1.2)
Total Protein: 7.4 g/dL (ref 6.1–8.1)
eGFR: 114 mL/min/{1.73_m2} (ref >=60)

## 2021-03-02 LAB — VITAMIN D 25 HYDROXY (VIT D DEFICIENCY, FRACTURES): Vit D, 25-Hydroxy: 26 ng/mL — ABNORMAL LOW (ref 30–100)

## 2021-03-02 LAB — NO CULTURE INDICATED

## 2021-03-02 LAB — LIPASE: Lipase: 18 U/L (ref 7–60)

## 2021-05-02 ENCOUNTER — Encounter: Payer: PRIVATE HEALTH INSURANCE | Admitting: Adult Health

## 2021-05-06 ENCOUNTER — Encounter: Payer: Self-pay | Admitting: Adult Health

## 2021-05-06 DIAGNOSIS — E781 Pure hyperglyceridemia: Secondary | ICD-10-CM | POA: Insufficient documentation

## 2021-05-06 NOTE — Progress Notes (Signed)
Complete Physical  Assessment and Plan:  Kimberly Howard was seen today for annual exam.  Diagnoses and all orders for this visit:  Encounter for Annual Physical Exam with abnormal findings Due annually  Health Maintenance reviewed Healthy lifestyle reviewed and goals set  Essential hypertension Continue medication: verapmil for headaches/BP Monitor blood pressure at home; call if consistently over 130/80 Continue DASH diet.   Reminder to go to the ER if any CP, SOB, nausea, dizziness, severe HA, changes vision/speech, left arm numbness and tingling and jaw pain. -     CBC with Diff -     COMPLETE METABOLIC PANEL WITH GFR -     Magnesium -     TSH -     Urinalysis, Routine w reflex microscopic  Other migraine without status migrainosus, not intractable Recently improved; continue verapmil; headaches resolve with OTC NSAIDs  TMJ (temporomandibular joint syndrome) Improved with lifestyle; follows dentist; flexeril PRN is helpful if needed, refilled. Will refer to PT for dry needling if needed.   Obesity Long discussion about weight loss, diet, and exercise Discussed goal weight and initial weight loss goal (<200lb) Patient will work on portions, increase exercise Patient on phentermine/topamax with benefit and no SE, taking drug breaks; continue close follow up.        -     phentermine (ADIPEX-P) 37.5 MG tablet; Take 1/2 to 1 tablet every morning as needed for dieting & weight loss. Take regular drug breaks. -     Hemoglobin A1c ([facility]) - defer per discussion with patient  Depression, unspecified depression type Continue sertraline; encouraged CBT, chronic anxiety, home stress Lifestyle discussed: diet/exerise, sleep hygiene, stress management, hydration  Vitamin D deficiency -     Vitamin D (25 hydroxy)  Cholelithiasis Low fat diet encouraged; deferring due to insurance and scheduling, has seen gen surgery.  -     COMPLETE METABOLIC PANEL WITH GFR  Screening for diabetes  mellitus -     Hemoglobin A1c ([facility]) - defer per shared med decision making  Screening cholesterol level -     Lipid Profile  Screening for thyroid disorder -     TSH  Abnormal stool calibur - ribbon like x 3 months with vague GI sx, does have remote family hx of early colon cancer, AA with increased risk, will refer to GI to discuss further recommendations.    Orders Placed This Encounter  Procedures   CBC with Differential/Platelet   COMPLETE METABOLIC PANEL WITH GFR   Magnesium   Lipid panel   TSH   VITAMIN D 25 Hydroxy (Vit-D Deficiency, Fractures)   Microalbumin / creatinine urine ratio   Urinalysis, Routine w reflex microscopic   Ambulatory referral to Gastroenterology      Discussed med's effects and SE's. Screening labs and tests as requested with regular follow-up as recommended. Over 40 minutes of exam, counseling, chart review, and complex, high level critical decision making was performed this visit.   Future Appointments  Date Time Provider [place]  05/10/2022  9:00 AM Liane Comber, NP [facility] None    HPI  41 y.o. female  presents for a complete physical and follow up for has Essential hypertension; Migraine; TMJ (temporomandibular joint syndrome); Obesity (BMI 30.0-34.9); Vitamin D deficiency; Cholelithiases; Depression; and Hypertriglyceridemia on their problem list.   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 Follows with GYN Kimberly Purl, Kimberly Howard for GYN last in fall 2022.   Lots of stress with work, stress with special needs kids, some marital stress -no benefit with wellbutrin 150 mg, lexapro 20 mg, buspirone 5 mg TID (sedation), Effexor, now on sertraline 100 mg and dose feel this helps some with day to day, but still with  some persistent anxiety. Difficult life situation, have discussed therapy, has looked into this but has a lot going on with kids, discussed further today, does plan to pursue once able.   Migraine - remote; recently with frontal headaches only, resolve with aleve quickly. She does have TMJ, has done flexeril PRN in the past which has been helpful, would like to repeat.   She was recently referred to Gen surgery Dr. Redmond Pulling for cholelithiasis, discussing lab chole but still pending due to scheduling issues. She also reports has been tracking stools etc more closely, was having loose stools and gas, has improved with reducing red meat intake, but notes intermittent ribbon like stools alternates with bristol type 5. Note early colon cancer hx in maternal cousin.    She is prescribed phentermine and topiramate for weight loss. She takes phentermine whole tab 2-3 tabs per week. While on the medication they have lost 0 lbs since last visit. Denies palpitations, anxiety, trouble sleeping, elevated BP.   BMI is Body mass index is 35.19 kg/m., she has been working on diet and exercise. [place] bike when has time, admits has been baking.  Wt Readings from Last 3 Encounters:  05/10/21 205 lb (93 kg)  03/01/21 205 lb (93 kg)  10/28/20 217 lb (98.4 kg)   She has not been checking BP at home, does have cuff, today their BP is BP: 116/78 She does workout. She denies chest pain, shortness of breath, dizziness. Reports occasional fluttering, 5 sec at rest, intermittent, no changes for several years.     She is not on cholesterol medication. Her cholesterol is at goal. The cholesterol last visit was:   Lab Results  Component Value Date   CHOL 178 03/17/2019   HDL 64 03/17/2019   LDLCALC 87 03/17/2019   TRIG 169 (H) 03/17/2019   CHOLHDL 2.8 03/17/2019   She has been working on diet and exercise for glucose management. Last A1C in the office was:  Lab Results  Component Value Date   HGBA1C 5.4 04/29/2020    Last GFR: Lab Results  Component Value Date   GFRAA 108 04/29/2020   Patient is currently on Vitamin D, taking 10000 IU daily:    Lab Results  Component Value Date   VD25OH 26 (L) 03/01/2021     Lab Results  Component Value Date   [id number] 024 04/29/2020      Current Medications:  Current Outpatient Medications on File Prior to Visit  Medication Sig Dispense Refill   Cholecalciferol (VITAMIN D) 125 MCG (5000 UT) CAPS Take by mouth daily.     phentermine (ADIPEX-P) 37.5 MG tablet Take 1/2 to 1 tablet once daily in the  morning as needed for dieting/weight loss. 30 tablet 2   sertraline (ZOLOFT) 100 MG tablet Take 1 tablet (100 mg total) by mouth daily. 90 tablet 3   topiramate (TOPAMAX) 50 MG tablet TAKE ONE HALF TO ONE TABLET BY MOUTH TWICE DAILY AT SUPPER AND AT BEDTIME FOR DIETING AND WEIGHT LOSS 180 tablet 1   verapamil (CALAN-SR) 240 MG CR tablet Take 1 tablet (240 mg total) by mouth at  bedtime. 90 tablet 1   B Complex-Biotin-FA (SUPER B-COMPLEX) TABS Take by mouth daily. (Patient not taking: Reported on 05/10/2021)     Multiple Vitamins-Minerals (MULTIVITAMIN ADULT PO) Take by mouth daily. (Patient not taking: Reported on 05/10/2021)     No current facility-administered medications on file prior to visit.   Allergies:  Allergies  Allergen Reactions   Adhesive [Tape]    Medical History:  She has Essential hypertension; Migraine; TMJ (temporomandibular joint syndrome); Obesity (BMI 30.0-34.9); Vitamin D deficiency; Cholelithiases; Depression; and Hypertriglyceridemia on their problem list.  Health Maintenance:   Immunization History  Administered Date(s) Administered   Influenza,inj,Quad PF,6+ Mos 03/01/2021   PFIZER(Purple Top)SARS-COV-2 Vaccination 07/10/2019, 08/04/2019, 03/07/2020   Tdap 04/07/2013, 09/30/2018   Health Maintenance  Topic Date Due   Pneumococcal Vaccine 67-4 Years old (1 - PCV) Never done   COVID-19 Vaccine (4 - Booster for Pfizer series)  05/02/2020   PAP SMEAR-Modifier  08/20/2022   TETANUS/TDAP  09/29/2028   INFLUENZA VACCINE  Completed   Hepatitis C Screening  Completed   HIV Screening  Completed   HPV VACCINES  Aged Out   Last Dental Exam: q88m, last 2022 Last Eye Exam: wears glasses, last 2022  Patient Care Team: Unk Pinto, Kimberly Howard as PCP - General (Internal Medicine) Paula Compton, Kimberly Howard as Consulting Physician (Obstetrics and Gynecology)  Surgical History:  She has a past surgical history that includes Nasal septum surgery; deviated septum (2004); and Cesarean section (N/A, 12/06/2018). Family History:  Herfamily history includes Asthma in her sister and sister; CVA in her maternal grandfather; Cancer in her paternal grandmother; Colon cancer (age of onset: 84) in her cousin; Diabetes in her maternal grandmother and mother; Heart murmur in her son; Hyperlipidemia in her father; Hypertension in her brother, maternal grandmother, and mother; Mental illness in her maternal grandmother; Other in her son. Social History:  She reports that she has never smoked. She has never used smokeless tobacco. She reports that she does not currently use alcohol. She reports that she does not use drugs.  Review of Systems: Review of Systems  Constitutional:  Negative for malaise/fatigue and weight loss.  HENT:  Negative for hearing loss and tinnitus.   Eyes:  Negative for blurred vision and double vision.  Respiratory:  Negative for cough, shortness of breath and wheezing.   Cardiovascular:  Negative for chest pain, palpitations, orthopnea, claudication and leg swelling.  Gastrointestinal:  Negative for abdominal pain, blood in stool, constipation, diarrhea, heartburn, melena, nausea and vomiting.  Genitourinary: Negative.   Musculoskeletal:  Negative for joint pain and myalgias.  Skin:  Negative for rash.  Neurological:  Positive for headaches (occasional migraines). Negative for dizziness, tingling, sensory change and  weakness.  Endo/Heme/Allergies:  Negative for polydipsia.  Psychiatric/Behavioral:  Negative for depression, substance abuse and suicidal ideas. The patient is not nervous/anxious and does not have insomnia (intermittent when stressed).   All other systems reviewed and are negative.  Physical Exam: Estimated body mass index is 35.19 kg/m as calculated from the following:   Height as of this encounter: 5\' 4"  (1.626 m).   Weight as of this encounter: 205 lb (93 kg). BP 116/78    Pulse 99    Temp 97.9 F (36.6 C)    Resp 16    Ht 5\' 4"  (1.626 m)    Wt 205 lb (93 kg)    SpO2 98%    BMI 35.19 kg/m  General Appearance: Well nourished, well dressed young AA adult female in  no apparent distress.  Eyes: PERRLA, EOMs, conjunctiva no swelling or erythema Sinuses: No Frontal/maxillary tenderness  ENT/Mouth: Ext aud canals clear, normal light reflex with TMs without erythema, bulging. Good dentition. No erythema, swelling, or exudate on post pharynx. Tonsils not swollen or erythematous. Hearing normal.  Neck: Supple, thyroid normal. No bruits  Respiratory: Respiratory effort normal, BS equal bilaterally without rales, rhonchi, wheezing or stridor.  Cardio: RRR without murmurs, rubs or gallops. Brisk peripheral pulses without edema.  Chest: symmetric, with normal excursions and percussion.  Breasts: Defer to GYN Abdomen: Soft, vague tenderness with deep palpation LLQ and RLQ without palpable mass/organs, no guarding, rebound, hernias, or organomegaly.  Lymphatics: Non tender without lymphadenopathy.  Genitourinary: Defer to GYN  Musculoskeletal: Full ROM all peripheral extremities,5/5 strength, and normal gait.  Skin: Warm, dry without rashes, lesions, ecchymosis. Neuro: Cranial nerves intact, reflexes equal bilaterally. Normal muscle tone, no cerebellar symptoms. Sensation intact.  Psych: Awake and oriented X 3, mildly depressed affect, Insight and Judgment appropriate.   EKG: WNL no ST changes in  2020 reviewed; defer.   Izora Ribas 9:28 AM [facility]

## 2021-05-10 ENCOUNTER — Ambulatory Visit: Payer: No Typology Code available for payment source | Admitting: Adult Health

## 2021-05-10 ENCOUNTER — Encounter: Payer: Self-pay | Admitting: Adult Health

## 2021-05-10 ENCOUNTER — Other Ambulatory Visit: Payer: Self-pay

## 2021-05-10 VITALS — BP 116/78 | HR 99 | Temp 97.9°F | Resp 16 | Ht 64.0 in | Wt 205.0 lb

## 2021-05-10 DIAGNOSIS — Z131 Encounter for screening for diabetes mellitus: Secondary | ICD-10-CM

## 2021-05-10 DIAGNOSIS — Z1329 Encounter for screening for other suspected endocrine disorder: Secondary | ICD-10-CM

## 2021-05-10 DIAGNOSIS — Z79899 Other long term (current) drug therapy: Secondary | ICD-10-CM | POA: Diagnosis not present

## 2021-05-10 DIAGNOSIS — E559 Vitamin D deficiency, unspecified: Secondary | ICD-10-CM

## 2021-05-10 DIAGNOSIS — I10 Essential (primary) hypertension: Secondary | ICD-10-CM

## 2021-05-10 DIAGNOSIS — R195 Other fecal abnormalities: Secondary | ICD-10-CM

## 2021-05-10 DIAGNOSIS — Z0000 Encounter for general adult medical examination without abnormal findings: Secondary | ICD-10-CM | POA: Diagnosis not present

## 2021-05-10 DIAGNOSIS — F32A Depression, unspecified: Secondary | ICD-10-CM

## 2021-05-10 DIAGNOSIS — Z13220 Encounter for screening for lipoid disorders: Secondary | ICD-10-CM

## 2021-05-10 DIAGNOSIS — Z1389 Encounter for screening for other disorder: Secondary | ICD-10-CM | POA: Diagnosis not present

## 2021-05-10 DIAGNOSIS — Z0001 Encounter for general adult medical examination with abnormal findings: Secondary | ICD-10-CM

## 2021-05-10 DIAGNOSIS — G43809 Other migraine, not intractable, without status migrainosus: Secondary | ICD-10-CM

## 2021-05-10 DIAGNOSIS — E781 Pure hyperglyceridemia: Secondary | ICD-10-CM

## 2021-05-10 DIAGNOSIS — E669 Obesity, unspecified: Secondary | ICD-10-CM

## 2021-05-10 MED ORDER — CYCLOBENZAPRINE HCL 5 MG PO TABS: 5.0000 mg | ORAL_TABLET | Freq: Three times a day (TID) | ORAL | 0 refills | Status: DC | PRN

## 2021-05-10 NOTE — Patient Instructions (Signed)
°  Ms. Strub , Thank you for taking time to come for your Annual Wellness Visit. I appreciate your ongoing commitment to your health goals. Please review the following plan we discussed and let me know if I can assist you in the future.   These are the goals we discussed:  Goals      Blood Pressure < 130/80     DIET - INCREASE WATER INTAKE     80+ fluid ounces daily      Exercise 150 min/wk Moderate Activity     Weight (lb) < 200 lb (90.7 kg)     Keep journal to track habits and progress Aim for 0.5-2 lb / week  Aim to make small sustainable changes        This is a list of the screening recommended for you and due dates:  Health Maintenance  Topic Date Due   COVID-19 Vaccine (4 - Booster for Pfizer series) 05/02/2020   Pneumococcal Vaccination (1 - PCV) 05/10/2041*   Pap Smear  08/20/2022   Tetanus Vaccine  09/29/2028   Flu Shot  Completed   Hepatitis C Screening: USPSTF Recommendation to screen - Ages 18-79 yo.  Completed   HIV Screening  Completed   HPV Vaccine  Aged Out  *Topic was postponed. The date shown is not the original due date.   Continue to monitor stools closely - let me/GI know if any concerning changes (such as blood, getting full quickly, more nausea, pain or vomiting.   Encourage you to reach out for Cognitive behavioral therapy (counseling) - can check with your insurance to see who is covered.       Know what a healthy weight is for you (roughly BMI <25) and aim to maintain this  Aim for 7+ servings of fruits and vegetables daily  65-80+ fluid ounces of water or unsweet tea for healthy kidneys  Limit to max 1 drink of alcohol per day; avoid smoking/tobacco  Limit animal fats in diet for cholesterol and heart health - choose grass fed whenever available  Avoid highly processed foods, and foods high in saturated/trans fats  Aim for low stress - take time to unwind and care for your mental health  Aim for 150 min of moderate intensity exercise  weekly for heart health, and weights twice weekly for bone health  Aim for 7-9 hours of sleep daily          A great goal to work towards is aiming to get in a serving daily of some of the most nutritionally dense foods - G- BOMBS daily

## 2021-05-11 LAB — CBC WITH DIFFERENTIAL/PLATELET
Absolute Monocytes: 567 cells/uL (ref 200–950)
Basophils Absolute: 49 cells/uL (ref 0–200)
Basophils Relative: 0.6 %
Eosinophils Absolute: 113 cells/uL (ref 15–500)
Eosinophils Relative: 1.4 %
HCT: 39.9 % (ref 35.0–45.0)
Hemoglobin: 13.1 g/dL (ref 11.7–15.5)
Lymphs Abs: 2762 cells/uL (ref 850–3900)
MCH: 30.8 pg (ref 27.0–33.0)
MCHC: 32.8 g/dL (ref 32.0–36.0)
MCV: 93.7 fL (ref 80.0–100.0)
MPV: 11.3 fL (ref 7.5–12.5)
Monocytes Relative: 7 %
Neutro Abs: 4609 cells/uL (ref 1500–7800)
Neutrophils Relative %: 56.9 %
Platelets: 297 10*3/uL (ref 140–400)
RBC: 4.26 10*6/uL (ref 3.80–5.10)
RDW: 12.2 % (ref 11.0–15.0)
Total Lymphocyte: 34.1 %
WBC: 8.1 10*3/uL (ref 3.8–10.8)

## 2021-05-11 LAB — COMPLETE METABOLIC PANEL WITH GFR
AG Ratio: 1.5 (calc) (ref 1.0–2.5)
ALT: 15 U/L (ref 6–29)
AST: 12 U/L (ref 10–30)
Albumin: 4.4 g/dL (ref 3.6–5.1)
Alkaline phosphatase (APISO): 43 U/L (ref 31–125)
BUN: 9 mg/dL (ref 7–25)
CO2: 26 mmol/L (ref 20–32)
Calcium: 9.8 mg/dL (ref 8.6–10.2)
Chloride: 108 mmol/L (ref 98–110)
Creat: 0.83 mg/dL (ref 0.50–0.97)
Globulin: 3 g/dL (calc) (ref 1.9–3.7)
Glucose, Bld: 86 mg/dL (ref 65–99)
Potassium: 4.2 mmol/L (ref 3.5–5.3)
Sodium: 140 mmol/L (ref 135–146)
Total Bilirubin: 0.3 mg/dL (ref 0.2–1.2)
Total Protein: 7.4 g/dL (ref 6.1–8.1)
eGFR: 92 mL/min/{1.73_m2} (ref >=60)

## 2021-05-11 LAB — LIPID PANEL
Cholesterol: 160 mg/dL (ref <200)
HDL: 56 mg/dL (ref >=50)
LDL Cholesterol (Calc): 86 mg/dL (calc)
Non-HDL Cholesterol (Calc): 104 mg/dL (calc) (ref <130)
Total CHOL/HDL Ratio: 2.9 (calc) (ref <5.0)
Triglycerides: 86 mg/dL (ref <150)

## 2021-05-11 LAB — VITAMIN D 25 HYDROXY (VIT D DEFICIENCY, FRACTURES): Vit D, 25-Hydroxy: 32 ng/mL (ref 30–100)

## 2021-05-11 LAB — URINALYSIS, ROUTINE W REFLEX MICROSCOPIC
Bilirubin Urine: NEGATIVE
Glucose, UA: NEGATIVE
Hgb urine dipstick: NEGATIVE
Ketones, ur: NEGATIVE
Leukocytes,Ua: NEGATIVE
Nitrite: NEGATIVE
Protein, ur: NEGATIVE
Specific Gravity, Urine: 1.015 (ref 1.001–1.035)
pH: 6.5 (ref 5.0–8.0)

## 2021-05-11 LAB — MAGNESIUM: Magnesium: 1.9 mg/dL (ref 1.5–2.5)

## 2021-05-11 LAB — MICROALBUMIN / CREATININE URINE RATIO
Creatinine, Urine: 128 mg/dL (ref 20–275)
Microalb Creat Ratio: 2 mcg/mg creat (ref <30)
Microalb, Ur: 0.3 mg/dL

## 2021-05-11 LAB — TSH: TSH: 1.06 mIU/L

## 2021-06-14 IMAGING — US US MFM OB FOLLOW UP
1 series · 14 of 28 positions shown · non-contrast
Comparison: none

[Series 1: us mfm ob follow up · 42 acquisitions, 14 frames shown]
[im 2/42]
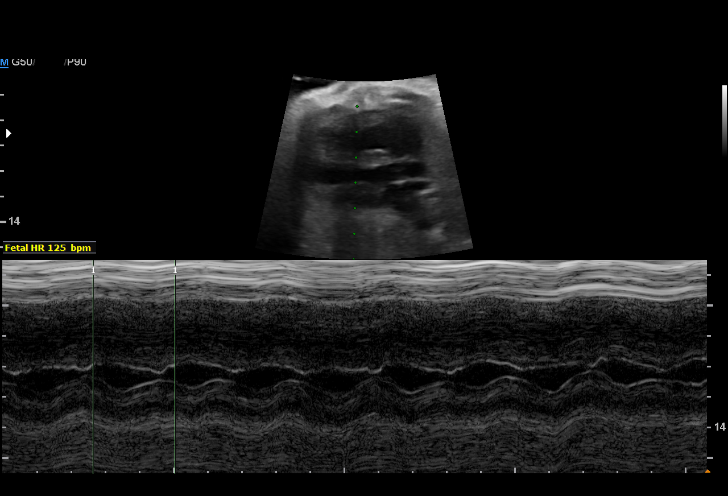
[im 5/42]
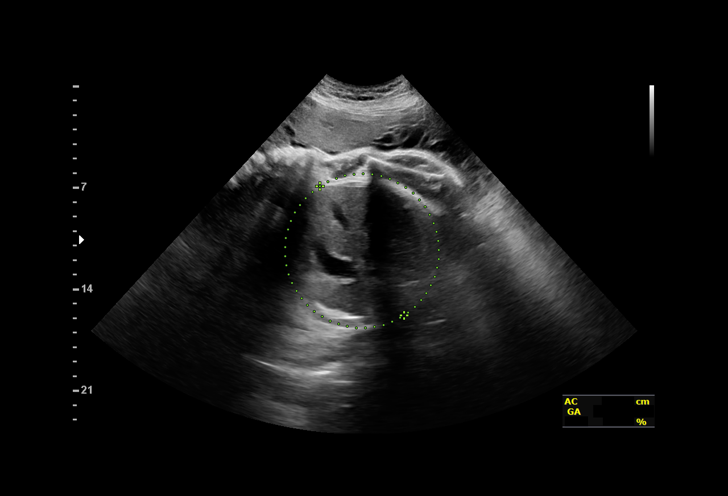
[im 8/42]
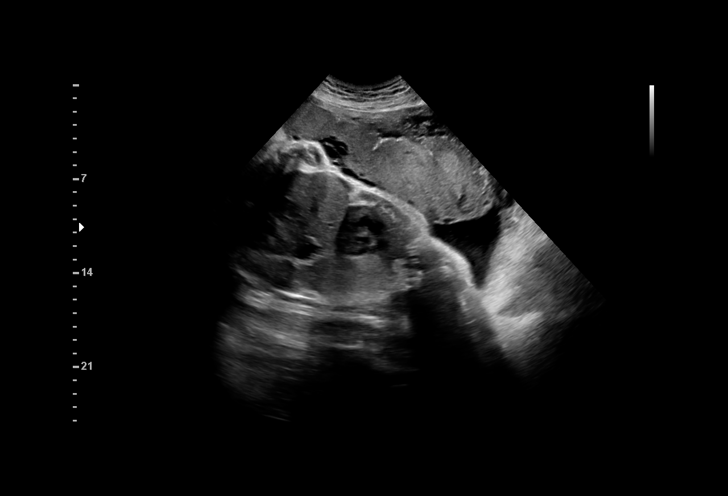
[im 11/42]
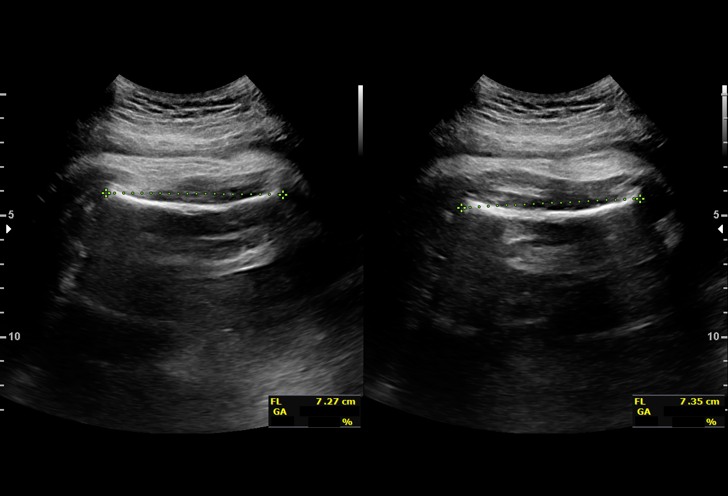
[im 14/42]
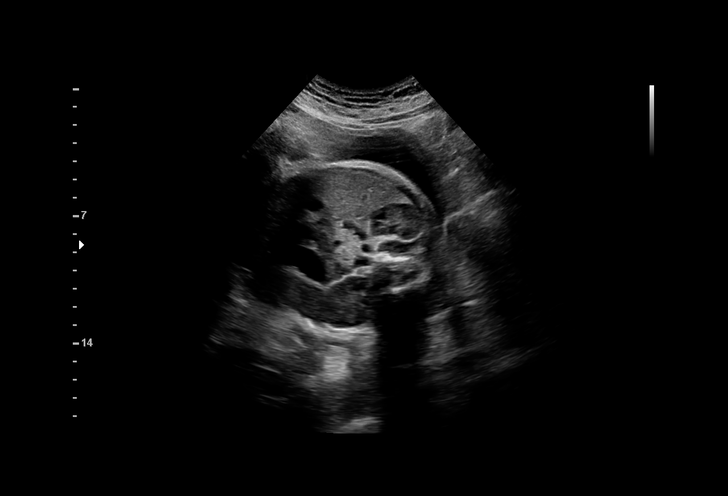
[im 17/42]
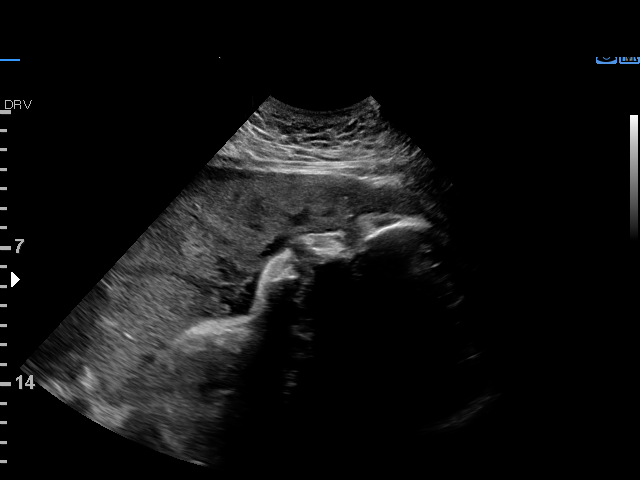
[im 20/42]
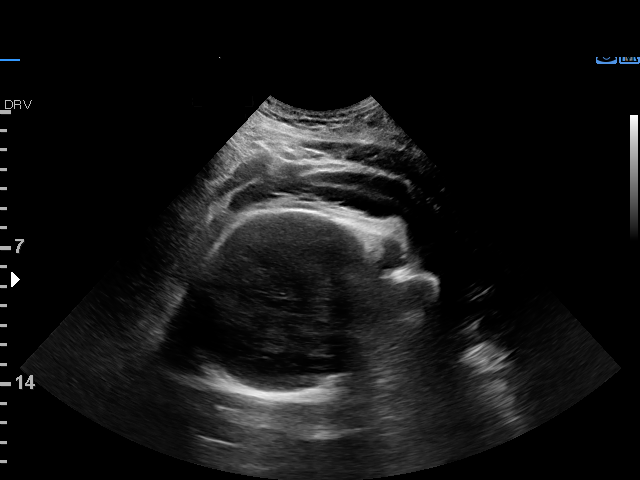
[im 23/42]
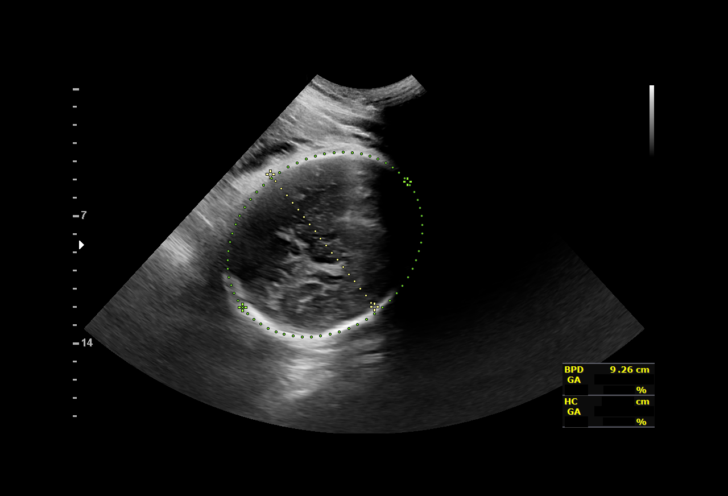
[im 26/42]
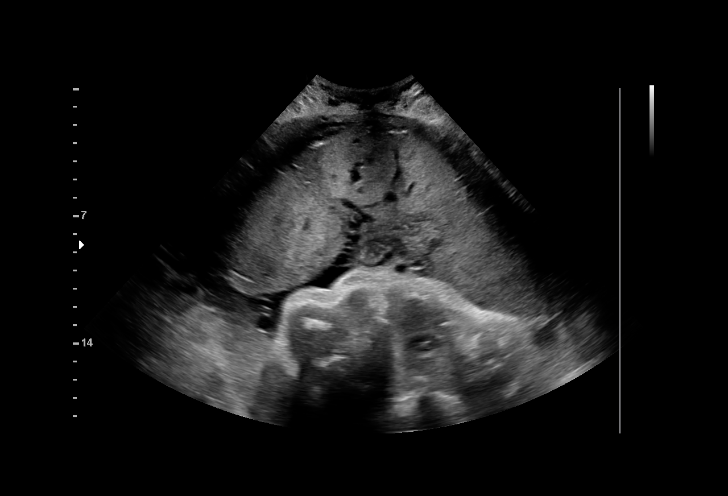
[im 29/42]
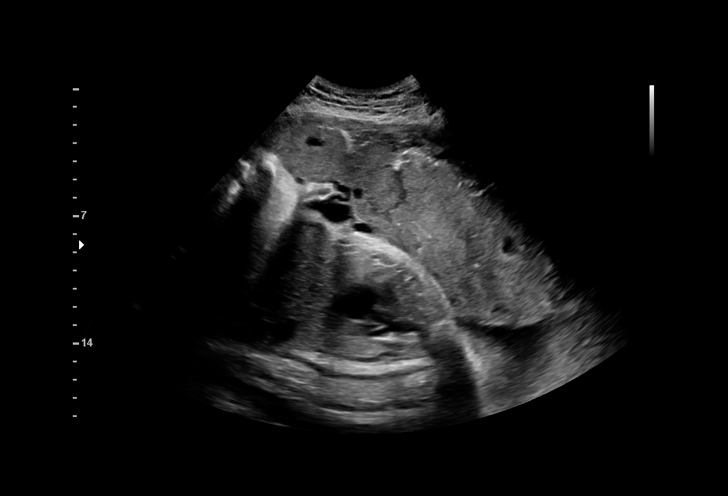
[im 32/42]
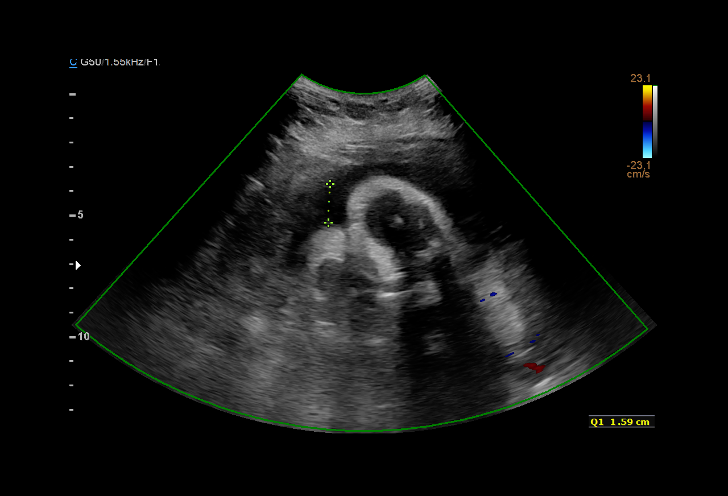
[im 35/42]
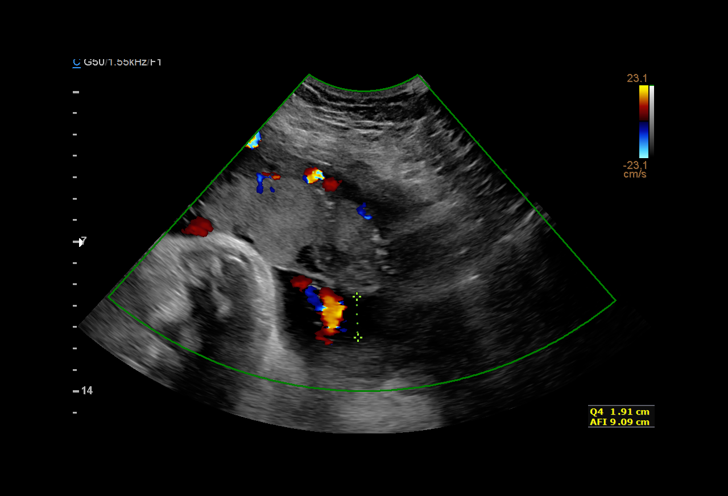
[im 38/42]
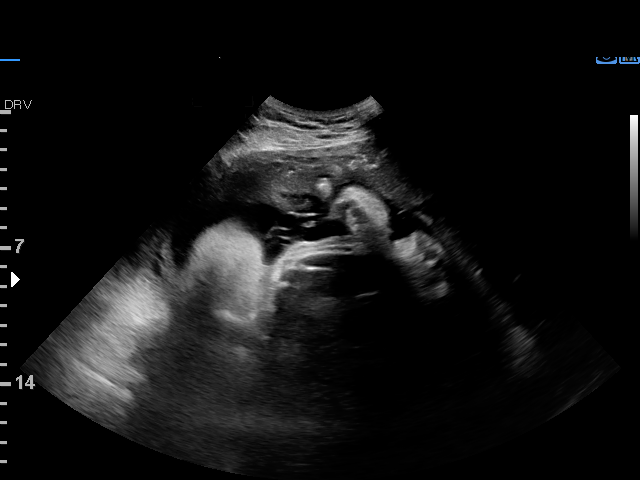
[im 42/42]
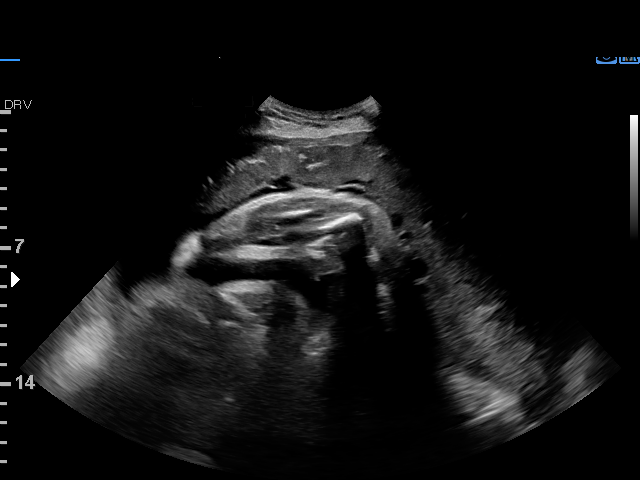

[14 of 28 positions shown; findings below may reference images not displayed]

#101

                                                       KENANGA
     STRESS                                            KENANGA
 ----------------------------------------------------------------------

 ----------------------------------------------------------------------
Indications

  Hypertension - Chronic/Pre-existing
  (Nifedipine)
  37 weeks gestation of pregnancy
  Fetal abnormality - other known or
  suspected (TIGER Malformation)
  Medical complication of pregnancy (heart
  murmur)
  Poor obstetric history: Previous
  preeclampsia / eclampsia/gestational HTN
  Genetic carrier (SMA, possible silent carrier)
  Obesity complicating pregnancy, third
  trimester (BMI 36.5)
  Advanced maternal age multigravida 35+,
  third trimester
  Encounter for other antenatal screening
  follow-up
  Uterine fibroids affecting pregnancy in third  O34.13,
  trimester, antepartum
 ----------------------------------------------------------------------
Vital Signs

 BMI:
Fetal Evaluation

 Num Of Fetuses:         1
 Fetal Heart Rate(bpm):  125
 Cardiac Activity:       Observed
 Presentation:           Cephalic
 Placenta:               Anterior
 P. Cord Insertion:      Previously Visualized

 Amniotic Fluid
 AFI FV:      Within normal limits

 AFI Sum(cm)     %Tile       Largest Pocket(cm)
 9.09            17

 RUQ(cm)       RLQ(cm)       LUQ(cm)        LLQ(cm)

Biophysical Evaluation

 Amniotic F.V:   Within normal limits       F. Tone:        Observed
 F. Movement:    Observed                   Score:          [DATE]
 F. Breathing:   Observed
Biometry

 BPD:      92.5  mm     G. Age:  37w 4d         80  %    CI:        77.42   %    70 - 86
                                                         FL/HC:      21.9   %    20.8 -
 HC:      332.8  mm     G. Age:  38w 0d         48  %    HC/AC:      0.99        0.92 -
 AC:      337.1  mm     G. Age:  37w 4d         79  %    FL/BPD:     78.9   %    71 - 87
 FL:         73  mm     G. Age:  37w 3d         59  %    FL/AC:      21.7   %    20 - 24

 Est. FW:    3780  gm      7 lb 3 oz     72  %
OB History

 Gravidity:    3         Term:   1        Prem:   0        SAB:   1
 TOP:          0       Ectopic:  0        Living: 1
Gestational Age

 LMP:           37w 0d        Date:  03/20/18                 EDD:   12/25/18
 U/S Today:     37w 5d                                        EDD:   12/20/18
 Best:          37w 0d     Det. By:  LMP  (03/20/18)          EDD:   12/25/18
Anatomy

 Cranium:               Appears normal         Aortic Arch:            Previously seen
 Cavum:                 Previously seen        Ductal Arch:            Previously seen
 Ventricles:            Previously seen        Diaphragm:              Appears normal
 Choroid Plexus:        Previously seen        Stomach:                Appears normal, left
                                                                       sided
 Cerebellum:            Variant, see           Abdomen:                Previously seen
                        comments
 Posterior Fossa:       Variant, see           Abdominal Wall:         Previously seen
                        comments
 Nuchal Fold:           Not applicable (>20    Cord Vessels:           Previously seen
                        wks GA)
 Face:                  Orbits and profile     Kidneys:                Appear normal
                        previously seen
 Lips:                  Previously seen        Bladder:                Appears normal
 Thoracic:              Appears normal         Spine:                  Previously seen
 Heart:                 Previously seen        Upper Extremities:      Previously seen
 RVOT:                  Previously seen        Lower Extremities:      Previously seen
 LVOT:                  Previously seen

 Other:  Male gender previously seen. Heels, 5th digit, Nasal bone, and Open
         hands previously visualized.
Cervix Uterus Adnexa

 Cervix
 Not visualized (advanced GA >32wks)
Impression

 Normal interval growth.
 Known Dandy Walker Variant
 BPP [DATE]
Recommendations

 Continue weekly BPP
 Consider delivery between 38-39 weeks.

## 2021-07-26 ENCOUNTER — Encounter: Payer: Self-pay | Admitting: Physician Assistant

## 2021-08-03 ENCOUNTER — Other Ambulatory Visit: Payer: Self-pay | Admitting: Adult Health

## 2021-08-03 DIAGNOSIS — E669 Obesity, unspecified: Secondary | ICD-10-CM

## 2021-08-03 DIAGNOSIS — I10 Essential (primary) hypertension: Secondary | ICD-10-CM

## 2021-08-12 NOTE — Progress Notes (Signed)
? ? ? ?08/15/2021 ?Kimberly Howard ?[id number] ?[birth year] ? ?Referring provider: Unk Pinto, MD ?Primary GI doctor: Dr. Ardis Howard ? ?ASSESSMENT AND PLAN:  ? ?Diarrhea, unspecified type ?Worse since her 2nd child, ? 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  ?If negative and nocturnal symptoms, will get inflammatory labs and plan for possible CT versus endoscopic evaluation for IBD.  ?Can consider xifaxin if everything negative.  ?-     CBC with Differential/Platelet; Future ?-     Comprehensive metabolic panel; Future ?-     Tissue transglutaminase, IgA; Future ?-     IgA; Future ?-     TSH; Future ?-     C-reactive protein; Future ?-     Sedimentation rate; Future ?-     Calprotectin, Fecal; Future ?-     DG Abd 1 View; Future ? ?Abdominal pain, left upper quadrant ?-     H. pylori antibody, IgG; Future ?Will check inflammatory labs if positive can consider CT versus EGD ?Add on pepcid as trial but no real GERD symptoms.  ? ?Calculus of gallbladder without cholecystitis without obstruction ?Patient has known cholelithiasis without inflammation.  ?AB pain can be from that, has seen gen surgery in the past, will get labs.  ?Discussed patient has 2 % chance risk a year to get symptoms of inflammation, discussed symptoms of cholecystitis and reasons to go to the ER.  ?Advised low fat diet.  ? ? ?Patient Care Team: ?Kimberly Pinto, MD as PCP - General (Internal Medicine) ?Kimberly Compton, MD as Consulting Physician (Obstetrics and Gynecology) ? ?HISTORY OF PRESENT ILLNESS: ?41 y.o. female with a past medical history of hypertension, migraines, depression, vitamin D deficiency, cholelithiasis AB [place] 04/01/2019 14 mm multiple gallstones and others listed below presents for evaluation of abnormal stool caliber.  ? ?Patient was seen by primary care 05/10/2021, Kimberly Comber, NP, describes ribbonlike stool for 3  months with vague GI symptoms.  Remote family history of early colon cancer.  Referred for evaluation. ?Patient is on phentermine for weight loss. ?Patient is complaining of loose stools with gas, improved with reducing red meat intake. ?Colon cancer history maternal cousin ? ?She works at home, has two kids 15 year old and 75 year old.  ?Her kids are special needs and does PT with her son. ?She states her stool has had extra mucus in it.  ?She has been having upper left quadrant pain for several months, she increased her water.  ?Has more loose stool her patient, will have 3 x a day, worse with menses, has some formed stools and can have constipation with hard rabbit pellets, since her second child, Kimberly Howard,  had emergency C section.  ?Will feel she is going to urinate but can have fecal incontinence.  ?She has had increase gas x 6 months.  ?No blood in the stool, no weight loss.  ?Can have nocturnal diarrhea up to 2-3 x a month, has had for years.  ?She does have urinary incontinence.  ? ?Mom with gallstones s/p pancreatitis, sister with gallbladder issues.  ?She has burping issues. Occ GERD, better since pregnancy.  ?Occ nausea, no vomiting with severe AB epigastric and LUQ pain, worse at bed time. Gas x/Tums help, pain will last for 30 mins.  ?AB [place] 04/01/2019 AB [place] elevated LFTs 14 mm multiple gallstones, no acute cholecystitis, saw Dr. Redmond Howard gen surgery consult.  ? ? ?CBC  05/10/2021  ?HGB 13.1 MCV 93.7 without evidence of anemia ?WBC 8.1 Platelets 297 ?04/29/2020  B12 487 ?Kidney function 05/10/2021  ?BUN 9 Cr 0.83  ?GFR 93  ?Potassium 4.2   ?LFTs 05/10/2021  ?AST 12 ALT 15 ?Alkphos 81 TBili 0.3 ?03/01/2021 LIPASE 18 ?05/10/2021 TSH 1.06 ? ?Current Medications:  ? ? ?Current Outpatient Medications (Cardiovascular):  ?  verapamil (CALAN-SR) 240 MG CR tablet, TAKE ONE TABLET BY MOUTH EVERY NIGHT AT BEDTIME ? ? ? ? ?Current Outpatient Medications (Other):  ?  Cholecalciferol (VITAMIN D) 125 MCG (5000 UT) CAPS, Take by  mouth daily. ?  cyclobenzaprine (FLEXERIL) 5 MG tablet, Take 1 tablet (5 mg total) by mouth 3 (three) times daily as needed for muscle spasms. ?  phentermine (ADIPEX-P) 37.5 MG tablet, TAKE 1/2 OF A TABLET TO 1 TABLET EVERY MORNING AS NEEDED FOR DIETING/WEIGHT LOSS ?  sertraline (ZOLOFT) 100 MG tablet, Takes '100mg'$  to '150mg'$  daily ?  topiramate (TOPAMAX) 50 MG tablet, TAKE ONE HALF TO ONE TABLET BY MOUTH TWICE DAILY AT SUPPER AND AT BEDTIME FOR DIETING AND WEIGHT LOSS ? ?Medical History:  ?Past Medical History:  ?Diagnosis Date  ? Anxiety   ? Attention deficit disorder 06/25/2020  ? Cholelithiases 04/25/2019  ? Fibroid   ? Gallstones   ? History of pre-eclampsia   ? Hypertension 2015  ? Preeclapsia with child, continued HTN  ? Migraines   ? Preeclampsia 05/28/2013  ? Status post cesarean delivery 12/07/2018  ? ?Allergies:  ?Allergies  ?Allergen Reactions  ? Adhesive [Tape]   ?  ? ?Surgical History:  ?She  has a past surgical history that includes Nasal septum surgery (03/2003) and Cesarean section (N/A, 12/06/2018). ?Family History:  ?Her family history includes Asthma in her sister and sister; CVA in her maternal grandfather; Cancer in her paternal grandmother; Colon cancer (age of onset: 4) in her cousin; Colon polyps in her mother; Diabetes in her maternal grandmother and mother; Diverticulitis in her mother; Gallstones in her mother; Heart murmur in her son; Hyperlipidemia in her father; Hypertension in her brother, maternal grandmother, and mother; Mental illness in her maternal grandmother; Other in her son; Pancreatitis in her mother. ?Social History:  ? reports that she has never smoked. She has never used smokeless tobacco. She reports that she does not currently use alcohol. She reports that she does not use drugs. ? ?REVIEW OF SYSTEMS  : All other systems reviewed and negative except where noted in the History of Present Illness. ? ? ?PHYSICAL EXAM: ?BP 124/80   Pulse 86   Ht '5\' 4"'$  (1.626 m)   Wt 212 lb  (96.2 kg)   BMI 36.39 kg/m?  ?General:   Pleasant, well developed female in no acute distress ?Head:   Normocephalic and atraumatic. ?Eyes:  sclerae anicteric,conjunctive pink  ?Heart:   regular rate and rhythm ?Pulm:  Clear anteriorly; no wheezing ?Abdomen:   Soft, Obese AB, Active bowel sounds. mild tenderness in the lower abdomen. Without guarding and Without rebound, No organomegaly appreciated. ?Extremities:  Without edema. ?Msk: Symmetrical without gross deformities. Peripheral pulses intact.  ?Neurologic:  Alert and  oriented x4;  No focal deficits.  ?Skin:   Dry and intact without significant lesions or rashes. ?Psychiatric:  Cooperative. Normal mood and affect. ? ? ? ?Vladimir Crofts, PA-C ?3:25 PM ? ? ?

## 2021-08-15 ENCOUNTER — Ambulatory Visit: Payer: No Typology Code available for payment source | Admitting: Physician Assistant

## 2021-08-15 ENCOUNTER — Encounter: Payer: Self-pay | Admitting: Physician Assistant

## 2021-08-15 ENCOUNTER — Ambulatory Visit (INDEPENDENT_AMBULATORY_CARE_PROVIDER_SITE_OTHER)
Admission: RE | Admit: 2021-08-15 | Discharge: 2021-08-15 | Disposition: A | Discharge status: Home / Self Care | Payer: No Typology Code available for payment source | Source: Ambulatory Visit | Attending: Physician Assistant | Admitting: Physician Assistant

## 2021-08-15 ENCOUNTER — Other Ambulatory Visit (INDEPENDENT_AMBULATORY_CARE_PROVIDER_SITE_OTHER): Payer: No Typology Code available for payment source

## 2021-08-15 VITALS — BP 124/80 | HR 86 | Ht 64.0 in | Wt 212.0 lb

## 2021-08-15 DIAGNOSIS — K8020 Calculus of gallbladder without cholecystitis without obstruction: Secondary | ICD-10-CM

## 2021-08-15 DIAGNOSIS — R1012 Left upper quadrant pain: Secondary | ICD-10-CM | POA: Diagnosis not present

## 2021-08-15 DIAGNOSIS — R197 Diarrhea, unspecified: Secondary | ICD-10-CM

## 2021-08-15 LAB — CBC WITH DIFFERENTIAL/PLATELET
Basophils Absolute: 0.1 10*3/uL (ref 0.0–0.1)
Basophils Relative: 0.5 % (ref 0.0–3.0)
Eosinophils Absolute: 0.1 10*3/uL (ref 0.0–0.7)
Eosinophils Relative: 1.3 % (ref 0.0–5.0)
HCT: 39.1 % (ref 36.0–46.0)
Hemoglobin: 12.9 g/dL (ref 12.0–15.0)
Lymphocytes Relative: 31.2 % (ref 12.0–46.0)
Lymphs Abs: 3.5 10*3/uL (ref 0.7–4.0)
MCHC: 33.1 g/dL (ref 30.0–36.0)
MCV: 90.3 fl (ref 78.0–100.0)
Monocytes Absolute: 0.8 10*3/uL (ref 0.1–1.0)
Monocytes Relative: 7.2 % (ref 3.0–12.0)
Neutro Abs: 6.6 10*3/uL (ref 1.4–7.7)
Neutrophils Relative %: 59.8 % (ref 43.0–77.0)
Platelets: 254 10*3/uL (ref 150.0–400.0)
RBC: 4.33 Mil/uL (ref 3.87–5.11)
RDW: 13.2 % (ref 11.5–15.5)
WBC: 11.1 10*3/uL — ABNORMAL HIGH (ref 4.0–10.5)

## 2021-08-15 LAB — COMPREHENSIVE METABOLIC PANEL
ALT: 17 U/L (ref 0–35)
AST: 16 U/L (ref 0–37)
Albumin: 4.6 g/dL (ref 3.5–5.2)
Alkaline Phosphatase: 48 U/L (ref 39–117)
BUN: 11 mg/dL (ref 6–23)
CO2: 25 mEq/L (ref 19–32)
Calcium: 9.6 mg/dL (ref 8.4–10.5)
Chloride: 106 mEq/L (ref 96–112)
Creatinine, Ser: 0.7 mg/dL (ref 0.40–1.20)
GFR: 109.51 mL/min (ref >60.00)
Glucose, Bld: 96 mg/dL (ref 70–99)
Potassium: 4.4 mEq/L (ref 3.5–5.1)
Sodium: 137 mEq/L (ref 135–145)
Total Bilirubin: 0.3 mg/dL (ref 0.2–1.2)
Total Protein: 8.1 g/dL (ref 6.0–8.3)

## 2021-08-15 LAB — SEDIMENTATION RATE: Sed Rate: 25 mm/hr — ABNORMAL HIGH (ref 0–20)

## 2021-08-15 LAB — C-REACTIVE PROTEIN: CRP: <1 mg/dL (ref 0.5–20.0)

## 2021-08-15 LAB — TSH: TSH: 1.21 u[IU]/mL (ref 0.35–5.50)

## 2021-08-15 NOTE — Patient Instructions (Addendum)
Recommend starting on a fiber supplement, can try metamucil first but if this causes gas/bloating switch to benefiber ?Take with fiber with with a full 8 oz glass of water once a day. This can take 1 month to start helping, so try for at least one month.  ?Recommend increasing water and activity.  ?Get a squatty potty to use at home or try a stool, goal is to get your knees above your hips during a bowel movement. ? ?- Drink at least 64-80 ounces of water/liquid per day. ?- Establish a time to try to move your bowels every day.  For many people, this is after a cup of coffee or after a meal such as breakfast. ?- Sit all of the way back on the toilet keeping your back fairly straight and while sitting up, try to rest the tops of your forearms on your upper thighs.   ?- Raising your feet with a step stool/squatty potty can be helpful to improve the angle that allows your stool to pass through the rectum. ?- Relax the rectum feeling it bulge toward the toilet water.  If you feel your rectum raising toward your body, you are contracting rather than relaxing. ?- Breathe in and slowly exhale. "Belly breath" by expanding your belly towards your belly button. Keep belly expanded as you gently direct pressure down and back to the anus.  A low pitched GRRR sound can assist with increasing intra-abdominal pressure.  ?- Repeat 3-4 times. If unsuccessful, contract the pelvic floor to restore normal tone and get off the toilet.  Avoid excessive straining. ?- To reduce excessive wiping by teaching your anus to normally contract, place hands on outer aspect of knees and resist knee movement outward.  Hold 5-10 second then place hands just inside of knees and resist inward movement of knees.  Hold 5 seconds.  Repeat a few times each way. ? ?Go to the ER if unable to pass gas, severe AB pain, unable to hold down food, any shortness of breath of chest pain.  ? ?CAN REFER TO PELVIC FLOOR PHYSICAL ? ?Can add on pepcid at night ?Avoid  spicy and acidic foods ?Avoid fatty foods ?Limit your intake of coffee, tea, alcohol, and carbonated drinks ?Work to maintain a healthy weight ?Keep the head of the bed elevated at least 3 inches with blocks or a wedge pillow if you are having any nighttime symptoms ?Stay upright for 2 hours after eating ? ? ?Go to the ER if you have any yellowing of the skin/eyes, dark urine, severe AB pain that will not go away, fever, chills, nausea or vomiting.  ? ? ?Gastroesophageal Reflux Disease, Adult ?Gastroesophageal reflux (GER) happens when acid from the stomach flows up into the tube that connects the mouth and the stomach (esophagus). Normally, food travels down the esophagus and stays in the stomach to be digested. However, when a person has GER, food and stomach acid sometimes move back up into the esophagus. If this becomes a more serious problem, the person may be diagnosed with a disease called gastroesophageal reflux disease (GERD). GERD occurs when the reflux: ?Happens often. ?Causes frequent or severe symptoms. ?Causes problems such as damage to the esophagus. ?When stomach acid comes in contact with the esophagus, the acid may cause inflammation in the esophagus. Over time, GERD may create small holes (ulcers) in the lining of the esophagus. ?What are the causes? ?This condition is caused by a problem with the muscle between the esophagus and the stomach (  lower esophageal sphincter, or LES). Normally, the LES muscle closes after food passes through the esophagus to the stomach. When the LES is weakened or abnormal, it does not close properly, and that allows food and stomach acid to go back up into the esophagus. ?The LES can be weakened by certain dietary substances, medicines, and medical conditions, including: ?Tobacco use. ?Pregnancy. ?Having a hiatal hernia. ?Alcohol use. ?Certain foods and beverages, such as coffee, chocolate, onions, and peppermint. ?What increases the risk? ?You are more likely to  develop this condition if you: ?Have an increased body weight. ?Have a connective tissue disorder. ?Take NSAIDs, such as ibuprofen. ?What are the signs or symptoms? ?Symptoms of this condition include: ?Heartburn. ?Difficult or painful swallowing and the feeling of having a lump in the throat. ?A bitter taste in the mouth. ?Bad breath and having a large amount of saliva. ?Having an upset or bloated stomach and belching. ?Chest pain. Different conditions can cause chest pain. Make sure you see your health care provider if you experience chest pain. ?Shortness of breath or wheezing. ?Ongoing (chronic) cough or a nighttime cough. ?Wearing away of tooth enamel. ?Weight loss. ?How is this diagnosed? ?This condition may be diagnosed based on a medical history and a physical exam. To determine if you have mild or severe GERD, your health care provider may also monitor how you respond to treatment. You may also have tests, including: ?A test to examine your stomach and esophagus with a small camera (endoscopy). ?A test that measures the acidity level in your esophagus. ?A test that measures how much pressure is on your esophagus. ?A barium swallow or modified barium swallow test to show the shape, size, and functioning of your esophagus. ?How is this treated? ?Treatment for this condition may vary depending on how severe your symptoms are. Your health care provider may recommend: ?Changes to your diet. ?Medicine. ?Surgery. ?The goal of treatment is to help relieve your symptoms and to prevent complications. ?Follow these instructions at home: ?Eating and drinking ? ?Follow a diet as recommended by your health care provider. This may involve avoiding foods and drinks such as: ?Coffee and tea, with or without caffeine. ?Drinks that contain alcohol. ?Energy drinks and sports drinks. ?Carbonated drinks or sodas. ?Chocolate and cocoa. ?Peppermint and mint flavorings. ?Garlic and onions. ?Horseradish. ?Spicy and acidic foods,  including peppers, chili powder, curry powder, vinegar, hot sauces, and barbecue sauce. ?Citrus fruit juices and citrus fruits, such as oranges, lemons, and limes. ?Tomato-based foods, such as red sauce, chili, salsa, and pizza with red sauce. ?Fried and fatty foods, such as donuts, french fries, potato chips, and high-fat dressings. ?High-fat meats, such as hot dogs and fatty cuts of red and white meats, such as rib eye steak, sausage, ham, and bacon. ?High-fat dairy items, such as whole milk, butter, and cream cheese. ?Eat small, frequent meals instead of large meals. ?Avoid drinking large amounts of liquid with your meals. ?Avoid eating meals during the 2-3 hours before bedtime. ?Avoid lying down right after you eat. ?Do not exercise right after you eat. ?Lifestyle ? ?Do not use any products that contain nicotine or tobacco. These products include cigarettes, chewing tobacco, and vaping devices, such as e-cigarettes. If you need help quitting, ask your health care provider. ?Try to reduce your stress by using methods such as yoga or meditation. If you need help reducing stress, ask your health care provider. ?If you are overweight, reduce your weight to an amount that is healthy for you.  Ask your health care provider for guidance about a safe weight loss goal. ?General instructions ?Pay attention to any changes in your symptoms. ?Take over-the-counter and prescription medicines only as told by your health care provider. Do not take aspirin, ibuprofen, or other NSAIDs unless your health care provider told you to take these medicines. ?Wear loose-fitting clothing. Do not wear anything tight around your waist that causes pressure on your abdomen. ?Raise (elevate) the head of your bed about 6 inches (15 cm). You can use a wedge to do this. ?Avoid bending over if this makes your symptoms worse. ?Keep all follow-up visits. This is important. ?Contact a health care provider if: ?You have: ?New symptoms. ?Unexplained  weight loss. ?Difficulty swallowing or it hurts to swallow. ?Wheezing or a persistent cough. ?A hoarse voice. ?Your symptoms do not improve with treatment. ?Get help right away if: ?You have sudden pain in your a

## 2021-08-16 LAB — IGA: Immunoglobulin A: 169 mg/dL (ref 47–310)

## 2021-08-16 LAB — TISSUE TRANSGLUTAMINASE, IGA: (tTG) Ab, IgA: <1 U/mL

## 2021-08-18 ENCOUNTER — Telehealth: Payer: Self-pay

## 2021-08-18 ENCOUNTER — Other Ambulatory Visit: Payer: Self-pay

## 2021-08-18 DIAGNOSIS — R1012 Left upper quadrant pain: Secondary | ICD-10-CM

## 2021-08-18 DIAGNOSIS — R197 Diarrhea, unspecified: Secondary | ICD-10-CM

## 2021-08-18 MED ORDER — PEG 3350-KCL-NA BICARB-NACL 420 G PO SOLR: 4000.0000 mL | Freq: Once | ORAL | 0 refills | Status: AC

## 2021-08-18 NOTE — Telephone Encounter (Signed)
Spoke with patient & scheduled colon with Dr. Ardis Hughs in the [facility] on 09/16/21 at 4:00 pm. Instructions have been sent to patient's mychart & home address. Golytely has been sent to pharmacy. Amb ref placed.  ?

## 2021-09-08 ENCOUNTER — Encounter: Payer: Self-pay | Admitting: Gastroenterology

## 2021-09-16 ENCOUNTER — Ambulatory Visit (AMBULATORY_SURGERY_CENTER): Payer: No Typology Code available for payment source | Admitting: Gastroenterology

## 2021-09-16 ENCOUNTER — Encounter: Payer: Self-pay | Admitting: Gastroenterology

## 2021-09-16 VITALS — BP 119/75 | HR 74 | Temp 96.4°F | Resp 12 | Ht 64.0 in | Wt 212.0 lb

## 2021-09-16 DIAGNOSIS — R1032 Left lower quadrant pain: Secondary | ICD-10-CM

## 2021-09-16 DIAGNOSIS — K529 Noninfective gastroenteritis and colitis, unspecified: Secondary | ICD-10-CM

## 2021-09-16 DIAGNOSIS — K5900 Constipation, unspecified: Secondary | ICD-10-CM

## 2021-09-16 DIAGNOSIS — R197 Diarrhea, unspecified: Secondary | ICD-10-CM

## 2021-09-16 MED ORDER — SODIUM CHLORIDE 0.9 % IV SOLN: 500.0000 mL | INTRAVENOUS | Status: DC

## 2021-09-16 NOTE — Op Note (Signed)
[facility] Patient Name: Kimberly Howard Procedure Date: 09/16/2021 3:05 PM MRN: [id number] Endoscopist: Milus Banister , MD Age: 41 Referring MD:  Date of Birth: [birth year] Gender: Female Account #: [id number] Procedure:                Colonoscopy Indications:              Abdominal pain in the left lower quadrant, Chronic                            diarrhea alternating with constipation Medicines:                Monitored Anesthesia Care Procedure:                Pre-Anesthesia Assessment:                           - Prior to the procedure, a History and Physical                            was performed, and patient medications and                            allergies were reviewed. The patient's tolerance of                            previous anesthesia was also reviewed. The risks                            and benefits of the procedure and the sedation                            options and risks were discussed with the patient.                            All questions were answered, and informed consent                            was obtained. Prior Anticoagulants: The patient has                            taken no previous anticoagulant or antiplatelet                            agents. ASA Grade Assessment: II - A patient with                            mild systemic disease. After reviewing the risks                            and benefits, the patient was deemed in                            satisfactory condition to undergo the procedure.  After obtaining informed consent, the colonoscope                            was passed under direct vision. Throughout the                            procedure, the patient's blood pressure, pulse, and                            oxygen saturations were monitored continuously. The                            Olympus CF-HQ190L [id number]) Colonoscope was                            introduced through the  anus and advanced to the the                            terminal ileum. The colonoscopy was performed                            without difficulty. The patient tolerated the                            procedure well. The quality of the bowel                            preparation was good. The terminal ileum, ileocecal                            valve, appendiceal orifice, and rectum were                            photographed. Scope In: 3:35:16 PM Scope Out: 3:48:09 PM Scope Withdrawal Time: 0 hours 9 minutes 5 seconds  Total Procedure Duration: 0 hours 12 minutes 53 seconds  Findings:                 The terminal ileum appeared normal.                           The entire examined colon appeared normal on direct                            and retroflexion views.                           Biopsies for histology were taken with a cold                            forceps from the entire colon for evaluation of                            microscopic colitis. Complications:            No immediate complications. Estimated blood  loss:                            None. Estimated Blood Loss:     Estimated blood loss: none. Impression:               - The examined portion of the ileum was normal.                           - The entire examined colon is normal on direct and                            retroflexion views.                           - Biopsies were taken with a cold forceps from the                            entire colon for evaluation of microscopic colitis. Recommendation:           - Patient has a contact number available for                            emergencies. The signs and symptoms of potential                            delayed complications were discussed with the                            patient. Return to normal activities tomorrow.                            Written discharge instructions were provided to the                            patient.                            - Resume previous diet.                           - Continue present medications.                           - Await pathology results. Milus Banister, MD 09/16/2021 3:50:44 PM This report has been signed electronically.

## 2021-09-16 NOTE — Progress Notes (Unsigned)
Called to room to assist during endoscopic procedure.  Patient ID and intended procedure confirmed with present staff. Received instructions for my participation in the procedure from the performing physician.  

## 2021-09-16 NOTE — Patient Instructions (Signed)
YOU HAD AN ENDOSCOPIC PROCEDURE TODAY AT THE [facility]:   Refer to the procedure report that was given to you for any specific questions about what was found during the examination.  If the procedure report does not answer your questions, please call your gastroenterologist to clarify.  If you requested that your care partner not be given the details of your procedure findings, then the procedure report has been included in a sealed envelope for you to review at your convenience later.  YOU SHOULD EXPECT: Some feelings of bloating in the abdomen. Passage of more gas than usual.  Walking can help get rid of the air that was put into your GI tract during the procedure and reduce the bloating. If you had a lower endoscopy (such as a colonoscopy or flexible sigmoidoscopy) you may notice spotting of blood in your stool or on the toilet paper. If you underwent a bowel prep for your procedure, you may not have a normal bowel movement for a few days.  Please Note:  You might notice some irritation and congestion in your nose or some drainage.  This is from the oxygen used during your procedure.  There is no need for concern and it should clear up in a day or so.  SYMPTOMS TO REPORT IMMEDIATELY:   Following lower endoscopy (colonoscopy or flexible sigmoidoscopy):  Excessive amounts of blood in the stool  Significant tenderness or worsening of abdominal pains  Swelling of the abdomen that is new, acute  Fever of 100F or higher   Following upper endoscopy (EGD)  Vomiting of blood or coffee ground material  New chest pain or pain under the shoulder blades  Painful or persistently difficult swallowing  New shortness of breath  Fever of 100F or higher  Black, tarry-looking stools  For urgent or emergent issues, a gastroenterologist can be reached at any hour by calling [phone]. Do not use MyChart messaging for urgent concerns.    DIET:  We do recommend a small meal at first, but  then you may proceed to your regular diet.  Drink plenty of fluids but you should avoid alcoholic beverages for 24 hours.  ACTIVITY:  You should plan to take it easy for the rest of today and you should NOT DRIVE or use heavy machinery until tomorrow (because of the sedation medicines used during the test).    FOLLOW UP: Our staff will call the number listed on your records 48-72 hours following your procedure to check on you and address any questions or concerns that you may have regarding the information given to you following your procedure. If we do not reach you, we will leave a message.  We will attempt to reach you two times.  During this call, we will ask if you have developed any symptoms of COVID 19. If you develop any symptoms (ie: fever, flu-like symptoms, shortness of breath, cough etc.) before then, please call [phone].  If you test positive for Covid 19 in the 2 weeks post procedure, please call and report this information to us.    If any biopsies were taken you will be contacted by phone or by letter within the next 1-3 weeks.  Please call us at [phone] if you have not heard about the biopsies in 3 weeks.    SIGNATURES/CONFIDENTIALITY: You and/or your care partner have signed paperwork which will be entered into your electronic medical record.  These signatures attest to the fact that that the information above on   your After Visit Summary has been reviewed and is understood.  Full responsibility of the confidentiality of this discharge information lies with you and/or your care-partner. 

## 2021-09-16 NOTE — Progress Notes (Unsigned)
HPI: This is a woman with chronic loose stools   ROS: complete GI ROS as described in HPI, all other review negative.  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     cyclobenzaprine (FLEXERIL) 5 MG tablet Take 1 tablet (5 mg total) by mouth 3 (three) times daily as needed for muscle spasms. 60 tablet 0   phentermine (ADIPEX-P) 37.5 MG tablet TAKE 1/2 OF A TABLET TO 1 TABLET EVERY MORNING AS NEEDED FOR DIETING/WEIGHT LOSS 30 tablet 0   sertraline (ZOLOFT) 100 MG tablet Takes '100mg'$  to '150mg'$  daily     topiramate (TOPAMAX) 50 MG tablet TAKE ONE HALF TO ONE TABLET BY MOUTH TWICE DAILY AT SUPPER AND AT BEDTIME FOR DIETING AND WEIGHT LOSS 180 tablet 1   verapamil (CALAN-SR) 240 MG CR tablet TAKE ONE TABLET BY MOUTH EVERY NIGHT AT BEDTIME 90 tablet 1   Current Facility-Administered Medications  Medication Dose Route Frequency Provider Last Rate Last Admin   0.9 %  sodium chloride infusion  500 mL Intravenous Continuous Milus Banister, MD        Allergies as of 09/16/2021 - Review Complete 09/16/2021  Allergen Reaction Noted   Adhesive [tape]  12/03/2018    Family History  Problem Relation Age of Onset   Hypertension Mother    Diabetes Mother    Colon polyps Mother     Diverticulitis Mother    Pancreatitis Mother    Gallstones Mother    Hyperlipidemia Father    Asthma Sister    Asthma Sister    Hypertension Brother    Diabetes Maternal Grandmother    Hypertension Maternal Grandmother    Mental illness Maternal Grandmother        bipolar?/dementia   CVA Maternal Grandfather    Cancer Paternal Grandmother        ? lung, smoker, metastatic   Heart murmur Son    Other Son    Colon cancer Cousin 45       ? second cousin on maternal side    Social History   Socioeconomic History   Marital status: Married    Spouse name: Not on file   Number of children: 2   Years of education: Not on file   Highest education level: Not on file  Occupational History   Occupation: [employer]  Tobacco Use   Smoking status: Never   Smokeless tobacco: Never  Vaping Use   Vaping Use: Never used  Substance and Sexual Activity   Alcohol use: Not Currently    Alcohol/week: 0.0 - 1.0 standard drinks    Comment: occas, 1 glass/month   Drug use: No   Sexual activity: Not Currently    Partners: Male  Other Topics Concern  Not on file  Social History Narrative   Not on file   Social Determinants of Health   Financial Resource Strain: Not on file  Food Insecurity: Not on file  Transportation Needs: Not on file  Physical Activity: Not on file  Stress: Not on file  Social Connections: Not on file  Intimate Partner Violence: Not on file     Physical Exam: BP 119/67   Pulse 83   Temp (!) 96.4 F (35.8 C)   Ht '5\' 4"'$  (1.626 m)   Wt 212 lb (96.2 kg)   LMP 09/11/2021 Comment: "Not sexually active"  SpO2 97%   BMI 36.39 kg/m  Constitutional: generally well-appearing Psychiatric: alert and oriented x3 Lungs: CTA bilaterally Heart: no MCR  Assessment and plan: 41 y.o. female with chronic loose stools   Colonoscopy today  Care is appropriate for the ambulatory setting.  Owens Loffler, MD [facility] 09/16/2021, 3:22 PM

## 2021-09-16 NOTE — Progress Notes (Unsigned)
PT taken to PACU. Monitors in place. VSS. Report given to RN. 

## 2021-09-20 ENCOUNTER — Telehealth: Payer: Self-pay | Admitting: *Deleted

## 2021-09-20 NOTE — Telephone Encounter (Signed)
  Follow up Call-     09/16/2021    3:16 PM  Call back number  Post procedure Call Back phone  # [phone]  Permission to leave phone message Yes     Patient questions:  Do you have a fever, pain , or abdominal swelling? No. Pain Score  0 *  Have you tolerated food without any problems? Yes.    Have you been able to return to your normal activities? Yes.    Do you have any questions about your discharge instructions: Diet   No. Medications  No. Follow up visit  No.  Do you have questions or concerns about your Care? No.  Actions: * If pain score is 4 or above: No action needed, pain <4.

## 2021-09-20 NOTE — Progress Notes (Unsigned)
09/21/2021 Kimberly Howard [id number] [birth year]  Referring provider: Unk Pinto, MD Primary GI doctor: Dr. Ardis Hughs  ASSESSMENT AND PLAN:   Diarrhea, unspecified type 09/16/2021 colonoscopy showed entirely normal colon, normal terminal ileum.  Good bowel prep.   mild leukocytosis with slightly elevated sed rate, normal CRP Surgical pathology pending. If negative will do trial of xifaxin and will refer to pelvic floor PT 3 month follow up  Abdominal pain, left upper quadrant Pending colon pathology Most likely IBS- will add on IBGARD Given information about FODMAP  Anal fissure Likely from colonoscopy We discussed anal fissures and long healing process Sitz baths, high-fiber diet, add on benefiber.  Long discussion about preventing constipation discussed in detail for prevention.  Diltiazem Gel ointment 2%, apply three times daily every day and then for 1 month after your pain is gone. Up to first knuckle. Given samples of recticare Follow up should symptoms persist for secondary evaluation and possible surgical referral for repair.  Calculus of gallbladder without cholecystitis without obstruction AB [place] 04/01/2019 AB [place] elevated LFTs 14 mm multiple gallstones, no acute cholecystitis, saw Dr. Redmond Pulling gen surgery consult.    History of Present Illness:  41 y.o. female  with a past medical history of hypertension, migraines, depression, vitamin D deficiency, cholelithiasis AB [place] 04/01/2019 14 mm multiple gallstones and others listed below, returns to clinic today for evaluation of diarrhea.  08/15/2021 office visit for diarrhea and abnormal stools for 3 months. Patient had mild leukocytosis with slightly elevated sed rate, normal CRP.  Scheduled for colonoscopy to evaluate for nocturnal stools, diarrhea with Dr. Ardis Hughs. 09/16/2021 colonoscopy showed entirely normal colon, normal terminal ileum.  Good bowel prep.  Surgical pathology pending. With normal colonoscopy likely  symptoms are due to pelvic dyssynergy.   Patient states after colonoscopy was wiping a lot and states she thinks she has hemorrhoid.Has some itching, warm water better, no blood.  She has a lot of mucus in her stools.  Bowels are slightly back from normal, states yesterday had small soft BM's 5-6 days time a day.   Has been off the miralax and fiber combination prior to the colonoscopy, will suggest this.  If pathology is negative, suggest pelvic floor PT and trial of xifaxin.   AB [place] 04/01/2019 AB [place] elevated LFTs 14 mm multiple gallstones, no acute cholecystitis, saw Dr. Redmond Pulling gen surgery consult.   Current Medications:    Current Outpatient Medications (Cardiovascular):    verapamil (CALAN-SR) 240 MG CR tablet, TAKE ONE TABLET BY MOUTH EVERY NIGHT AT BEDTIME     Current Outpatient Medications (Other):    Cholecalciferol (VITAMIN D) 125 MCG (5000 UT) CAPS, Take by mouth daily.   cyclobenzaprine (FLEXERIL) 5 MG tablet, Take 1 tablet (5 mg total) by mouth 3 (three) times daily as needed for muscle spasms.   phentermine (ADIPEX-P) 37.5 MG tablet, TAKE 1/2 OF A TABLET TO 1 TABLET EVERY MORNING AS NEEDED FOR DIETING/WEIGHT LOSS   sertraline (ZOLOFT) 100 MG tablet, Takes '100mg'$  to '150mg'$  daily   topiramate (TOPAMAX) 50 MG tablet, TAKE ONE HALF TO ONE TABLET BY MOUTH TWICE DAILY AT SUPPER AND AT BEDTIME FOR DIETING AND WEIGHT LOSS (Patient taking differently: Take 100 mg by mouth daily.)  Surgical History:  She  has a past surgical history that includes Nasal septum surgery (03/2003) and Cesarean section (N/A, 12/06/2018). Family History:  Her family history includes Asthma in her sister and sister; CVA in her maternal grandfather; Cancer in her paternal grandmother; Colon  cancer (age of onset: 92) in her cousin; Colon polyps in her father; Diabetes in her maternal grandmother and mother; Diverticulitis in her mother; Gallstones in her mother; Heart murmur in her son; Hyperlipidemia in her  father; Hypertension in her brother, maternal grandmother, and mother; Mental illness in her maternal grandmother; Other in her son; Pancreatitis in her mother. Social History:   reports that she has never smoked. She has never used smokeless tobacco. She reports current alcohol use. She reports that she does not use drugs.  Current Medications, Allergies, Past Medical History, Past Surgical History, Family History and Social History were reviewed in [employer] record.  Physical Exam: BP 108/76   Pulse 88   Ht '5\' 4"'$  (1.626 m)   Wt 215 lb (97.5 kg)   LMP 09/11/2021 Comment: "Not sexually active"  SpO2 98%   BMI 36.90 kg/m  General:   Pleasant, well developed female in no acute distress Abdomen:  Soft, Obese AB, Active bowel sounds. mild tenderness in the RLQ and in the LUQ. Without guarding and Without rebound, No organomegaly appreciated. Rectal: anterior fissure noted near anal skin tag no internal rectal exam Neurologic:  Alert and  oriented x4;  No focal deficits.  Psych:  Cooperative. Normal mood and affect.   Vladimir Crofts, PA-C 09/21/21

## 2021-09-21 ENCOUNTER — Ambulatory Visit (INDEPENDENT_AMBULATORY_CARE_PROVIDER_SITE_OTHER): Payer: No Typology Code available for payment source | Admitting: Physician Assistant

## 2021-09-21 ENCOUNTER — Encounter: Payer: Self-pay | Admitting: Physician Assistant

## 2021-09-21 VITALS — BP 108/76 | HR 88 | Ht 64.0 in | Wt 215.0 lb

## 2021-09-21 DIAGNOSIS — R1012 Left upper quadrant pain: Secondary | ICD-10-CM

## 2021-09-21 DIAGNOSIS — R197 Diarrhea, unspecified: Secondary | ICD-10-CM | POA: Diagnosis not present

## 2021-09-21 DIAGNOSIS — K8020 Calculus of gallbladder without cholecystitis without obstruction: Secondary | ICD-10-CM

## 2021-09-21 MED ORDER — NON FORMULARY: 1 refills | Status: DC

## 2021-09-21 NOTE — Patient Instructions (Addendum)
If you are age 39 or younger, your body mass index should be between 19-25. Your Body mass index is 36.9 kg/m. If this is out of the aformentioned range listed, please consider follow up with your Primary Care Provider.  ________________________________________________________  The [facility] providers would like to encourage you to use [facility] to communicate with providers for non-urgent requests or questions.  Due to long hold times on the telephone, sending your provider a message by [facility] may be a faster and more efficient way to get a response.  Please allow 48 business hours for a response.  Please remember that this is for non-urgent requests.  _______________________________________________________  We have given you some Recticare samples, that you may use with your Diltiazem gel.  Call the office in the next month to schedule a 3 month follow up.  Thank you for entrusting me with your care and choosing [facility]  Vicie Mutters, PA-C  Miralax is an osmotic laxative.  It only brings more water into the stool.  This is safe to take daily.  Can take up to 17 gram of miralax twice a day.  Mix with juice or coffee.  Start 1 capful at night for 3-4 days and reassess your response in 3-4 days.  You can increase and decrease the dose based on your response.  Remember, it can take up to 3-4 days to take effect OR for the effects to wear off.   I often pair this with benefiber in the morning to help assure the stool is not too loose.   Please try low FODMAP diet Try trial off milk/lactose products.  Add fiber like benefiber or citracel once a day Increase activity Can do trial of IBGard for AB pain- Take 1-2 capsules once a day for maintence or twice a day during a flare Please try to decrease stress. if any worsening symptoms like blood in stool, weight loss, please call the office or go to the ER.   If the pathology is negative from your colonoscopy will suggest  xifaxin trial for IBS and referral to pelvic floor PT  Anal Fissure, Adult  Diltiazem/lidocaine 3 x daily for 2 months sent to compound pharmacy  Can use with recticare  Sent this medication to a compound pharmacy:  [facility] [address], [place], [place] [address]  [phone]  Please DO NOT go directly from our office to pick up this medication! Give the pharmacy 1 day to process the prescription. Extra time is required for them to compound your medication.  Follow up should symptoms persist for secondary evaluation and possible surgical referral for repair. An anal fissure is a small tear or crack in the skin around the anus. Bleeding from a fissure usually stops on its own within a few minutes. However, bleeding will often occur again with each bowel movement until the crack heals. CAUSES This condition may be caused by: Passing large, hard stool (feces). Frequent diarrhea. Constipation. Inflammatory bowel disease (Crohn disease or ulcerative colitis). Infections. Anal sex. SYMPTOMS Symptoms of this condition include: Bleeding from the rectum. Small amounts of blood seen on your stool, on toilet paper, or in the toilet after a bowel movement. Painful bowel movements. Itching or irritation around the anus. DIAGNOSIS  A health care provider may diagnose this condition by closely examining the anal area. An anal fissure can usually be seen with careful inspection. In some cases, a rectal exam may be performed, or a short tube (anoscope) may  be used to examine the anal canal. TREATMENT Treatment for this condition may include: Taking steps to avoid constipation. This may include making changes to your diet, such as increasing your intake of fiber or fluid. Taking fiber supplements. These supplements can soften your stool to help make bowel movements easier. Your health care provider may also prescribe a stool softener if your stool is often hard. Taking sitz  baths. This may help to heal the tear. Using medicated creams or ointments. These may be prescribed to lessen discomfort. HOME CARE INSTRUCTIONS Eating and Drinking Avoid foods that may be constipating, such as bananas and dairy products. Drink enough fluid to keep your urine clear or pale yellow. Maintain a diet that is high in fruits, whole grains, and vegetables. General Instructions Keep the anal area as clean and dry as possible. Take sitz baths as told by your health care provider. Do not use soap in the sitz baths. Take over-the-counter and prescription medicines only as told by your health care provider. Use creams or ointments only as told by your health care provider. Keep all follow-up visits as told by your health care provider. This is important. SEEK MEDICAL CARE IF: You have more bleeding. You have a fever. You have diarrhea that is mixed with blood. You continue to have pain. Your problem is getting worse rather than better.   This information is not intended to replace advice given to you by your health care provider. Make sure you discuss any questions you have with your health care provider.   Document Released: 04/10/2005 Document Revised: 12/30/2014 Document Reviewed: 07/06/2014 Elsevier Interactive Patient Education 2016 Elsevier Inc.  Irritable Bowel Syndrome, Adult Irritable bowel syndrome (IBS) is a group of symptoms that affects the organs responsible for digestion (gastrointestinal or GI tract). IBS is not one specific disease. To regulate how the GI tract works, the body sends signals back and forth between the intestines and the brain. If you have IBS, there may be a problem with these signals. As a result, the GI tract does not function normally. The intestines may become more sensitive and overreact to certain things. This may be especially true when you eat certain foods or when you are under stress. There are four types of IBS. These may be determined  based on the consistency of your stool (feces): IBS with diarrhea. IBS with constipation. Mixed IBS. Unsubtyped IBS. It is important to know which type of IBS you have. Certain treatments are more likely to be helpful for certain types of IBS. What are the causes? The exact cause of IBS is not known. What increases the risk? You may have a higher risk for IBS if you: Are female. Are younger than 76. Have a family history of IBS. Have a mental health condition, such as depression, anxiety, or post-traumatic stress disorder. Have had a bacterial infection of your GI tract. What are the signs or symptoms? Symptoms of IBS vary from person to person. The main symptom is abdominal pain or discomfort. Other symptoms usually include one or more of the following: Diarrhea, constipation, or both. Abdominal swelling or bloating. Feeling full after eating a small or regular-sized meal. Frequent gas. Mucus in the stool. A feeling of having more stool left after a bowel movement. Symptoms tend to come and go. They may be triggered by stress, mental health conditions, or certain foods. How is this treated? There is no cure for IBS, but treatment can help relieve symptoms. Treatment depends on the type  of IBS you have, and may include: Changes to your diet, such as: Avoiding foods that cause symptoms. Drinking more water. Following a low-FODMAP (fermentable oligosaccharides, disaccharides, monosaccharides, and polyols) diet for up to 6 weeks, or as told by your health care provider. FODMAPs are sugars that are hard for some people to digest. Eating more fiber. Eating medium-sized meals at the same times every day. Medicines. These may include: Fiber supplements, if you have constipation. Medicine to control diarrhea (antidiarrheal medicines). Medicine to help control muscle tightening (spasms) in your GI tract (antispasmodic medicines). Medicines to help with mental health conditions, such as  antidepressants or tranquilizers. Talk therapy or counseling. Working with a diet and nutrition specialist (dietitian) to help create a food plan that is right for you. Managing your stress. Follow these instructions at home: Eating and drinking Eat a healthy diet. Eat medium-sized meals at about the same time every day. Do not eat large meals. Gradually eat more fiber-rich foods. These include whole grains, fruits, and vegetables. This may be especially helpful if you have IBS with constipation. Eat a diet low in FODMAPs. Drink enough fluid to keep your urine pale yellow. Keep a journal of foods that seem to trigger symptoms. Avoid foods and drinks that: Contain added sugar. Make your symptoms worse. Dairy products, caffeinated drinks, and carbonated drinks can make symptoms worse for some people.   Here some information about pelvic floor dysfunction. This may be contributing to some of your symptoms. We will continue with our evaluation but I do want you to consider adding on fiber supplement with low-dose MiraLAX daily. We could also refer to pelvic floor physical therapy.   Pelvic Floor Dysfunction, Female Pelvic floor dysfunction (PFD) is a condition that results when the group of muscles and connective tissues that support the organs in the pelvis (pelvic floor muscles) do not work well. These muscles and their connections form a sling that supports the colon and bladder. In women, they also support the uterus. PFD causes pelvic floor muscles to be too weak, too tight, or both. In PFD, muscle movements are not coordinated. This may cause bowel or bladder problems. It may also cause pain. What are the causes? This condition may be caused by an injury to the pelvic area or by a weakening of pelvic muscles. This often results from pregnancy and childbirth or other types of strain. In many cases, the exact cause is not known. What increases the risk? The following factors may make you  more likely to develop this condition: Having chronic bladder tissue inflammation (interstitial cystitis). Being an older person. Being overweight. History of radiation treatment for cancer in the pelvic region. Previous pelvic surgery, such as removal of the uterus (hysterectomy). What are the signs or symptoms? Symptoms of this condition vary and may include: Bladder symptoms, such as: Trouble starting urination and emptying the bladder. Frequent urinary tract infections. Leaking urine when coughing, laughing, or exercising (stress incontinence). Having to pass urine urgently or frequently. Pain when passing urine. Bowel symptoms, such as: Constipation. Urgent or frequent bowel movements. Incomplete bowel movements. Painful bowel movements. Leaking stool or gas. Unexplained genital or rectal pain. Genital or rectal muscle spasms. Low back pain. Other symptoms may include: A heavy, full, or aching feeling in the vagina. A bulge that protrudes into the vagina. Pain during or after sex. How is this diagnosed? This condition may be diagnosed based on: Your symptoms and medical history. A physical exam. During the exam, your health care  provider may check your pelvic muscles for tightness, spasm, pain, or weakness. This may include a rectal exam and a pelvic exam. In some cases, you may have diagnostic tests, such as: Electrical muscle function tests. Urine flow testing. X-ray tests of bowel function. Ultrasound of the pelvic organs. How is this treated? Treatment for this condition depends on the symptoms. Treatment options include: Physical therapy. This may include Kegel exercises to help relax or strengthen the pelvic floor muscles. Biofeedback. This type of therapy provides feedback on how tight your pelvic floor muscles are so that you can learn to control them. Internal or external massage therapy. A treatment that involves electrical stimulation of the pelvic floor  muscles to help control pain (transcutaneous electrical nerve stimulation, or TENS). Sound wave therapy (ultrasound) to reduce muscle spasms. Medicines, such as: Muscle relaxants. Bladder control medicines. Surgery to reconstruct or support pelvic floor muscles may be an option if other treatments do not help. Follow these instructions at home: Activity Do your usual activities as told by your health care provider. Ask your health care provider if you should modify any activities. Do pelvic floor strengthening or relaxing exercises at home as told by your physical therapist. Lifestyle Maintain a healthy weight. Eat foods that are high in fiber, such as beans, whole grains, and fresh fruits and vegetables. Limit foods that are high in fat and processed sugars, such as fried or sweet foods. Manage stress with relaxation techniques such as yoga or meditation. General instructions If you have problems with leakage: Use absorbable pads or wear padded underwear. Wash frequently with mild soap. Keep your genital and anal area as clean and dry as possible. Ask your health care provider if you should try a barrier cream to prevent skin irritation. Take warm baths to relieve pelvic muscle tension or spasms. Take over-the-counter and prescription medicines only as told by your health care provider. Keep all follow-up visits. How is this prevented? The cause of PFD is not always known, but there are a few things you can do to reduce the risk of developing this condition, including: Staying at a healthy weight. Getting regular exercise. Managing stress. Contact a health care provider if: Your symptoms are not improving with home care. You have signs or symptoms of PFD that get worse at home. You develop new signs or symptoms. You have signs of a urinary tract infection, such as: Fever. Chills. Increased urinary frequency. A burning feeling when urinating. You have not had a bowel movement in  3 days (constipation). Summary Pelvic floor dysfunction results when the muscles and connective tissues in your pelvic floor do not work well. These muscles and their connections form a sling that supports your colon and bladder. In women, they also support the uterus. PFD may be caused by an injury to the pelvic area or by a weakening of pelvic muscles. PFD causes pelvic floor muscles to be too weak, too tight, or a combination of both. Symptoms may vary from person to person. In most cases, PFD can be treated with physical therapies and medicines. Surgery may be an option if other treatments do not help. This information is not intended to replace advice given to you by your health care provider. Make sure you discuss any questions you have with your health care provider. Document Revised: 08/18/2020 Document Reviewed: 08/18/2020 Elsevier Patient Education  [place].

## 2021-09-21 NOTE — Progress Notes (Signed)
I agree with the above note, plan 

## 2021-11-07 ENCOUNTER — Other Ambulatory Visit: Payer: Self-pay

## 2021-11-07 DIAGNOSIS — Z6835 Body mass index (BMI) 35.0-35.9, adult: Secondary | ICD-10-CM

## 2021-11-07 MED ORDER — TOPIRAMATE 50 MG PO TABS: ORAL_TABLET | ORAL | 1 refills | Status: DC

## 2021-11-16 ENCOUNTER — Ambulatory Visit: Payer: No Typology Code available for payment source | Admitting: Nurse Practitioner

## 2021-12-07 ENCOUNTER — Ambulatory Visit (INDEPENDENT_AMBULATORY_CARE_PROVIDER_SITE_OTHER): Payer: No Typology Code available for payment source | Admitting: Nurse Practitioner

## 2021-12-07 ENCOUNTER — Encounter: Payer: Self-pay | Admitting: Nurse Practitioner

## 2021-12-07 ENCOUNTER — Encounter: Payer: Self-pay | Admitting: Internal Medicine

## 2021-12-07 VITALS — BP 118/80 | HR 82 | Temp 97.7°F | Ht 64.0 in | Wt 219.0 lb

## 2021-12-07 DIAGNOSIS — E781 Pure hyperglyceridemia: Secondary | ICD-10-CM

## 2021-12-07 DIAGNOSIS — E669 Obesity, unspecified: Secondary | ICD-10-CM

## 2021-12-07 DIAGNOSIS — R109 Unspecified abdominal pain: Secondary | ICD-10-CM

## 2021-12-07 DIAGNOSIS — R10A1 Flank pain, right side: Secondary | ICD-10-CM

## 2021-12-07 DIAGNOSIS — F32A Depression, unspecified: Secondary | ICD-10-CM | POA: Diagnosis not present

## 2021-12-07 DIAGNOSIS — Z79899 Other long term (current) drug therapy: Secondary | ICD-10-CM

## 2021-12-07 DIAGNOSIS — K8020 Calculus of gallbladder without cholecystitis without obstruction: Secondary | ICD-10-CM | POA: Diagnosis not present

## 2021-12-07 DIAGNOSIS — I10 Essential (primary) hypertension: Secondary | ICD-10-CM | POA: Diagnosis not present

## 2021-12-07 DIAGNOSIS — E559 Vitamin D deficiency, unspecified: Secondary | ICD-10-CM | POA: Diagnosis not present

## 2021-12-07 DIAGNOSIS — F988 Other specified behavioral and emotional disorders with onset usually occurring in childhood and adolescence: Secondary | ICD-10-CM

## 2021-12-07 DIAGNOSIS — E66811 Obesity, class 1: Secondary | ICD-10-CM

## 2021-12-07 DIAGNOSIS — F900 Attention-deficit hyperactivity disorder, predominantly inattentive type: Secondary | ICD-10-CM

## 2021-12-07 DIAGNOSIS — M25551 Pain in right hip: Secondary | ICD-10-CM

## 2021-12-07 MED ORDER — AMPHETAMINE-DEXTROAMPHET ER 20 MG PO CP24: 20.0000 mg | ORAL_CAPSULE | Freq: Every day | ORAL | 0 refills | Status: DC

## 2021-12-07 NOTE — Patient Instructions (Addendum)
Therapists I recommend : Arbutus Leas PhD [place] [id number] ph [place] [address] [place], [place] [address] [place] [address] [place] [address] [phone]     Xray ordered for right hip and right thoracic region at [place]  M-F 8:30-3:45   Cholelithiasis  Cholelithiasis happens when gallstones form in the gallbladder. The gallbladder stores bile. Bile is a fluid that helps digest fats. Bile can harden and form into gallstones. If they cause a blockage, they can cause pain (gallbladder attack). What are the causes? This condition may be caused by: Some blood diseases, such as sickle cell anemia. Too much of a fat-like substance (cholesterol) in your bile. Not enough bile salts in your bile. These salts help the body absorb and digest fats. The gallbladder not emptying fully or often enough. This is common in pregnant women. What increases the risk? The following factors may make you more likely to develop this condition: Being female. Being pregnant many times. Eating a lot of fried foods, fat, and refined carbs (refined carbohydrates). Being very overweight (obese). Being older than age 68. Using medicines with female hormones in them for a long time. Losing weight fast. Having gallstones in your family. Having some health problems, such as diabetes, Crohn's disease, or liver disease. What are the signs or symptoms? Often, there may be gallstones but no symptoms. These gallstones are called silent gallstones. If a gallstone causes a blockage, you may get sudden pain. The pain: Can be in the upper right part of your belly (abdomen). Normally comes at night or after you eat. Can last an hour or more. Can spread to your right shoulder, back, or chest. Can feel like discomfort, burning, or fullness in the upper part of your belly (indigestion). If the blockage lasts  more than a few hours, you can get an infection or swelling. You may: Feel like you may vomit. Vomit. Feel bloated. Have belly pain for 5 hours or more. Feel tender in your belly, often in the upper right part and under your ribs. Have fever or chills. Have skin or the white parts of your eyes turn yellow (jaundice). Have dark pee (urine) or pale poop (stool). How is this treated? Treatment for this condition depends on how bad you feel. If you have symptoms, you may need: Home care, if symptoms are not very bad. Do not eat for 12-24 hours. Drink only water and clear liquids. Start to eat simple or clear foods after 1 or 2 days. Try broths and crackers. You may need medicines for pain or stomach upset or both. If you have an infection, you will need antibiotics. A hospital stay, if you have very bad pain or a very bad infection. Surgery to remove your gallbladder. You may need this if: Gallstones keep coming back. You have very bad symptoms. Medicines to break up gallstones. Medicines: Are best for small gallstones. May be used for up to 6-12 months. A procedure to find and take out gallstones or to break up gallstones. Follow these instructions at home: Medicines Take over-the-counter and prescription medicines only as told by your doctor. If you were prescribed an antibiotic medicine, take it as told by your doctor. Do not stop taking the antibiotic even if you start to feel better. Ask your doctor if the medicine prescribed to you requires you to avoid driving or using machinery. Eating and drinking Drink enough  fluid to keep your urine pale yellow. Drink water or clear fluids. This is important when you have pain. Eat healthy foods. Choose: Fewer fatty foods, such as fried foods. Fewer refined carbs. Avoid breads and grains that are highly processed, such as white bread and white rice. Choose whole grains, such as whole-wheat bread and brown rice. More fiber. Almonds, fresh  fruit, and beans are healthy sources. General instructions Keep a healthy weight. Keep all follow-up visits as told by your doctor. This is important. Where to find more information [employer] of Diabetes and Digestive and Kidney Diseases: [email or web address] Contact a doctor if: You have sudden pain in the upper right part of your belly. Pain might spread to your right shoulder, back, or chest. You have been diagnosed with gallstones that have no symptoms and you get: Belly pain. Discomfort, burning, or fullness in the upper part of your abdomen. You have dark urine or pale stools. Get help right away if: You have sudden pain in the upper right part of your abdomen, and the pain lasts more than 2 hours. You have pain in your abdomen, and: It lasts more than 5 hours. It keeps getting worse. You have a fever or chills. You keep feeling like you may vomit. You keep vomiting. Your skin or the white parts of your eyes turn yellow. Summary Cholelithiasis happens when gallstones form in the gallbladder. This condition may be caused by a blood disease, too much of a fat-like substance in the bile, or not enough bile salts in bile. Treatment for this condition depends on how bad you feel. If you have symptoms, do not eat or drink. You may need medicines. You may need a hospital stay for very bad pain or a very bad infection. You may need surgery if gallstones keep coming back or if you have very bad symptoms. This information is not intended to replace advice given to you by your health care provider. Make sure you discuss any questions you have with your health care provider. Document Revised: 05/30/2019 Document Reviewed: 03/03/2019 Elsevier Patient Education  [place].

## 2021-12-07 NOTE — Progress Notes (Signed)
Assessment and Plan:  Kimberly Howard was seen today for follow-up.  Diagnoses and all orders for this visit: ADHD Questionnaire was consistent with ADHD Start Adderall XR 20 mg and follow up in 1 month  Obesity  Doing better with phentermine/topamax Initial weight loss goal <200 lb Continue with diet and exercise efforts; try to find exercise that she enjoys or pair with social activity discussed high fiber/minimally processed whole foods Follow up in 3 months   Essential hypertension Well controlled; Continue medication Monitor blood pressure at home; call if consistently over 130/80 Continue DASH diet.   Reminder to go to the ER if any CP, SOB, nausea, dizziness, severe HA, changes vision/speech, left arm numbness and tingling and jaw pain. - CBC, CMP  Calculus of gallbladder without cholecystitis without obstruction Mild intermittent sx, avoiding fatty foods, lifestyle modification  Have discussed elective lap chole; has upcoming planned  Depression/ADD Has noted benefit with sertraline 150 mg I have set goal of having a therapist appointment scheduled when she returns in 1 month Many stressors due to marriage/job Likely would benefit from CBT/marriage counseling, continue to discuss Lifestyle discussed: diet/exerise, sleep hygiene, stress management, hydration  Vitamin D deficiency Continue Vit D supplementation to maintain value in therapeutic level of 60-100  - Vitamin D  Medication Management Continued  Right flank pain Possibly related to gallbladder- f/u with surgeon Will get x ray to rule out musculoskeletal cause Unable to reproduce pain  If develops fever, nausea/vomiting or severe pain she is to go to the ER  Hypertriglyceridemia Continue diet and exercise - lipid panel  Right hip pain - Xray If normal will monitor and if symptoms worsen will refer to ortho Unable to reproduce pain on exam today If develops fever, nausea/vomiting or severe pain she is to go  to the ER  Discussed med's effects and SE's.   Over 30 minutes of exam, counseling, chart review, and critical decision making was performed.   Future Appointments  Date Time Provider [place]  05/10/2022  9:00 AM Darrol Jump, NP [facility] None    ------------------------------------------------------------------------------------------------------------------  HPI 41 y.o.female presents for 6 month follow up for obesity, htn monitoring, depression/ADD. 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 Follows with GYN Kimberly Purl, MD for GYN last in fall 2022.   She has had mild persistent LFT elevation, had negative hepatitis panel 03/17/2019, abd US showed normal liver/bile duct but with numerous cholelithiasis ~76m without appearance of cholecystitis on 04/01/2019. Have discussed likely need cholecystectomy at some point but have been postponing due to young children and weight restrictions was postponing but earlier this year was agreeable and was evaluated by gen surgery Dr. EGreer Pickerel planning lap chole . Still has a lot of epigastric pain with eating.   She has noticed a dull pain in LUQ of abdomen intermittent and lasts a few minutes and then will resolve spontaneously.   Also noticing Right flank back pain when lying flat and right hip pain when laying on the right hip. Describes the pain as pressure and states it is 2/3 and only occurs when laying on it.  Cannot be reproduced.   Lots of stress with work, stress with special needs kids, some marital stress -no benefit with wellbutrin 150 mg, lexapro 20 mg, buspirone 5 mg TID (sedation), Effexor, now on sertraline  150 mg and dose feel this helps some with day to day, but still with some persistent anxiety. She  has 2 special needs children, her grandmother passed in February unexpectedly- mom is having a hard time with it.  Her brother has been diagnosed with multiple myeloma at age 39. He lives close so she is having to help there as well. Past 6 months. She is in the process of getting a therapist.   She has been noticing increased difficulty focusing. She has taken Adderall in the past as an adult. The Adderall  did help somewhat at 20 mg dose. She has been on Topamax off and on for migraines since age 4 and has never had memory side effects.    Her blood pressure has been controlled at home, today their BP is BP: 118/80  BP Readings from Last 3 Encounters:  12/07/21 118/80  09/21/21 108/76  09/16/21 119/75   She does workout. She denies chest pain, shortness of breath, dizziness.   BMI is Body mass index is 37.59 kg/m.  Admits diet/exercise not as good as she needs due to young children. She is under additional stress.  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 PRN   Cholecalciferol (VITAMIN D) 125 MCG (5000 UT) CAPS Take by  mouth daily.   cyclobenzaprine (FLEXERIL) 5 MG tablet Take 1 tablet (5 mg total) by mouth 3 (three) times daily as needed for muscle spasms.   IBUPROFEN PO Take by mouth. PRN   NON FORMULARY Diltiazem 2% compound Use 3 x rectally daily for 2 months to heal anal fissure   sertraline (ZOLOFT) 100 MG tablet Takes 14m to 1534mdaily   topiramate (TOPAMAX) 50 MG tablet TAKE ONE HALF TO ONE TABLET BY MOUTH TWICE DAILY AT SUPPER AND AT BEDTIME FOR DIETING AND WEIGHT LOSS   verapamil (CALAN-SR) 240 MG CR tablet TAKE ONE TABLET BY MOUTH EVERY NIGHT AT BEDTIME   phentermine (ADIPEX-P) 37.5 MG tablet TAKE 1/2 OF A TABLET TO 1 TABLET EVERY MORNING AS NEEDED FOR DIETING/WEIGHT LOSS (Patient not taking: Reported on 12/07/2021)   No current facility-administered medications on file prior to visit.    ROS: all negative except above.   Physical Exam:  BP 118/80   Pulse 82   Temp 97.7 F (36.5 C)   Ht 5' 4"  (1.626 m)   Wt 219 lb (99.3 kg)   LMP 11/26/2021   SpO2 98%   BMI 37.59  kg/m   General Appearance: Well nourished,obese AA female in no apparent distress. Eyes: conjunctiva no swelling or erythema ENT/Mouth: Mask in place; Hearing normal.  Neck: Supple, thyroid normal.  Respiratory: Respiratory effort normal, BS equal bilaterally without rales, rhonchi, wheezing or stridor.  Cardio: RRR with no MRGs. Brisk peripheral pulses without edema.  Abdomen: Soft obese obdomen, + BS.  Mild vague tenderness throughout. Neg murphy's. No palpable mass or organomegaly.  Lymphatics: Non tender without lymphadenopathy.  Musculoskeletal: No obvious deformity, normal gait.  Unable to reproduce back or hip pain Skin: Warm, dry without rashes, lesions, ecchymosis.  Neuro: Normal muscle tone, no cerebellar symptoms.  Psych: Awake and oriented X 3, normal affect, Insight and Judgment appropriate.     Alycia Rossetti, NP 3:31 PM [facility]

## 2021-12-09 LAB — COMPLETE METABOLIC PANEL WITH GFR
AG Ratio: 1.6 (calc) (ref 1.0–2.5)
ALT: 15 U/L (ref 6–29)
AST: 15 U/L (ref 10–30)
Albumin: 4.5 g/dL (ref 3.6–5.1)
Alkaline phosphatase (APISO): 46 U/L (ref 31–125)
BUN: 8 mg/dL (ref 7–25)
CO2: 25 mmol/L (ref 20–32)
Calcium: 9.4 mg/dL (ref 8.6–10.2)
Chloride: 106 mmol/L (ref 98–110)
Creat: 0.73 mg/dL (ref 0.50–0.97)
Globulin: 2.9 g/dL (calc) (ref 1.9–3.7)
Glucose, Bld: 88 mg/dL (ref 65–99)
Potassium: 4.1 mmol/L (ref 3.5–5.3)
Sodium: 139 mmol/L (ref 135–146)
Total Bilirubin: 0.2 mg/dL (ref 0.2–1.2)
Total Protein: 7.4 g/dL (ref 6.1–8.1)
eGFR: 108 mL/min/{1.73_m2} (ref >=60)

## 2021-12-09 LAB — URINE CULTURE
MICRO NUMBER:: 13788405
Result:: NO GROWTH
SPECIMEN QUALITY:: ADEQUATE

## 2021-12-09 LAB — CBC WITH DIFFERENTIAL/PLATELET
Absolute Monocytes: 894 cells/uL (ref 200–950)
Basophils Absolute: 65 cells/uL (ref 0–200)
Basophils Relative: 0.6 %
Eosinophils Absolute: 120 cells/uL (ref 15–500)
Eosinophils Relative: 1.1 %
HCT: 38.1 % (ref 35.0–45.0)
Hemoglobin: 12.8 g/dL (ref 11.7–15.5)
Lymphs Abs: 3270 cells/uL (ref 850–3900)
MCH: 30.4 pg (ref 27.0–33.0)
MCHC: 33.6 g/dL (ref 32.0–36.0)
MCV: 90.5 fL (ref 80.0–100.0)
MPV: 11.3 fL (ref 7.5–12.5)
Monocytes Relative: 8.2 %
Neutro Abs: 6551 cells/uL (ref 1500–7800)
Neutrophils Relative %: 60.1 %
Platelets: 302 10*3/uL (ref 140–400)
RBC: 4.21 10*6/uL (ref 3.80–5.10)
RDW: 12.5 % (ref 11.0–15.0)
Total Lymphocyte: 30 %
WBC: 10.9 10*3/uL — ABNORMAL HIGH (ref 3.8–10.8)

## 2021-12-09 LAB — URINALYSIS, ROUTINE W REFLEX MICROSCOPIC
Bacteria, UA: NONE SEEN /HPF
Bilirubin Urine: NEGATIVE
Glucose, UA: NEGATIVE
Hgb urine dipstick: NEGATIVE
Hyaline Cast: NONE SEEN /LPF
Ketones, ur: NEGATIVE
Nitrite: NEGATIVE
Protein, ur: NEGATIVE
RBC / HPF: NONE SEEN /HPF (ref 0–2)
Specific Gravity, Urine: 1.017 (ref 1.001–1.035)
WBC, UA: NONE SEEN /HPF (ref 0–5)
pH: 6 (ref 5.0–8.0)

## 2021-12-09 LAB — LIPID PANEL
Cholesterol: 189 mg/dL (ref <200)
HDL: 55 mg/dL (ref >=50)
LDL Cholesterol (Calc): 103 mg/dL (calc) — ABNORMAL HIGH
Non-HDL Cholesterol (Calc): 134 mg/dL (calc) — ABNORMAL HIGH (ref <130)
Total CHOL/HDL Ratio: 3.4 (calc) (ref <5.0)
Triglycerides: 191 mg/dL — ABNORMAL HIGH (ref <150)

## 2021-12-09 LAB — VITAMIN D 25 HYDROXY (VIT D DEFICIENCY, FRACTURES): Vit D, 25-Hydroxy: 26 ng/mL — ABNORMAL LOW (ref 30–100)

## 2021-12-09 LAB — MICROSCOPIC MESSAGE

## 2022-01-09 ENCOUNTER — Ambulatory Visit: Payer: No Typology Code available for payment source | Admitting: Nurse Practitioner

## 2022-01-26 ENCOUNTER — Ambulatory Visit: Payer: No Typology Code available for payment source | Admitting: Nurse Practitioner

## 2022-02-24 ENCOUNTER — Ambulatory Visit: Payer: No Typology Code available for payment source | Admitting: Nurse Practitioner

## 2022-03-09 ENCOUNTER — Other Ambulatory Visit: Payer: Self-pay | Admitting: Obstetrics and Gynecology

## 2022-03-09 DIAGNOSIS — D1721 Benign lipomatous neoplasm of skin and subcutaneous tissue of right arm: Secondary | ICD-10-CM

## 2022-03-20 ENCOUNTER — Ambulatory Visit: Payer: No Typology Code available for payment source | Admitting: Nurse Practitioner

## 2022-03-20 ENCOUNTER — Encounter: Payer: Self-pay | Admitting: Nurse Practitioner

## 2022-03-20 VITALS — BP 110/70 | HR 82 | Temp 97.7°F | Ht 64.0 in | Wt 215.8 lb

## 2022-03-20 DIAGNOSIS — E782 Mixed hyperlipidemia: Secondary | ICD-10-CM

## 2022-03-20 DIAGNOSIS — F900 Attention-deficit hyperactivity disorder, predominantly inattentive type: Secondary | ICD-10-CM

## 2022-03-20 DIAGNOSIS — Z79899 Other long term (current) drug therapy: Secondary | ICD-10-CM

## 2022-03-20 DIAGNOSIS — I10 Essential (primary) hypertension: Secondary | ICD-10-CM

## 2022-03-20 DIAGNOSIS — Z6837 Body mass index (BMI) 37.0-37.9, adult: Secondary | ICD-10-CM

## 2022-03-20 DIAGNOSIS — M26609 Unspecified temporomandibular joint disorder, unspecified side: Secondary | ICD-10-CM

## 2022-03-20 DIAGNOSIS — Z23 Encounter for immunization: Secondary | ICD-10-CM | POA: Diagnosis not present

## 2022-03-20 MED ORDER — AMPHETAMINE-DEXTROAMPHET ER 30 MG PO CP24: 30.0000 mg | ORAL_CAPSULE | Freq: Every day | ORAL | 0 refills | Status: DC

## 2022-03-20 MED ORDER — CYCLOBENZAPRINE HCL 5 MG PO TABS: 5.0000 mg | ORAL_TABLET | Freq: Three times a day (TID) | ORAL | 0 refills | Status: DC | PRN

## 2022-03-20 NOTE — Patient Instructions (Signed)
Fair life protein shakes Eat more frequently - try not to go more than 6 hours without protein Aim for 90 grams of protein a day- 30 breakfast/30 lunch 30 dinner Try to keep net carbs less than 50 Net Carbs=Total Carbs-fiber- sugar alcohols Exercise heartrate 120-140(fat burning zone)- walking 20-30 minutes 4 days a week  

## 2022-03-20 NOTE — Progress Notes (Signed)
Assessment and Plan:  Kimberly Howard was seen today for follow-up.  Diagnoses and all orders for this visit:  Essential hypertension Well controlled on Verapamil 240 mg daily - continue medications, DASH diet, exercise and monitor at home. Call if greater than 130/80.  -     CBC with Differential/Platelet  TMJ (temporomandibular joint syndrome) Continue to wear mouth guard and use Flexeril as needed -     cyclobenzaprine (FLEXERIL) 5 MG tablet; Take 1 tablet (5 mg total) by mouth 3 (three) times daily as needed for muscle spasms.  Attention deficit hyperactivity disorder (ADHD), predominantly inattentive type Continue diet , exercise, weight loss and meds -     amphetamine-dextroamphetamine (ADDERALL XR) 30 MG 24 hr capsule; Take 1 capsule (30 mg total) by mouth daily.  Mixed hyperlipidemia Continue diet and exercise -     COMPLETE METABOLIC PANEL WITH GFR -     Lipid panel -     TSH  Medication management -     CBC with Differential/Platelet -     COMPLETE METABOLIC PANEL WITH GFR -     Lipid panel -     TSH  Class 2 severe obesity due to excess calories with serious comorbidity and body mass index (BMI) of 37.0 to 37.9 in adult [facility]) Fair life protein shakes Eat more frequently - try not to go more than 6 hours without protein Aim for 90 grams of protein a day- 30 breakfast/30 lunch 30 dinner Try to keep net carbs less than 50 Net Carbs=Total Carbs-fiber- sugar alcohols Exercise heartrate 120-140(fat burning zone)- walking 20-30 minutes 4 days a week  Flu vaccine need -     Flu Vaccine QUAD 6+ mos PF IM (Fluarix Quad PF)       Further disposition pending results of labs. Discussed med's effects and SE's.   Over 30 minutes of exam, counseling, chart review, and critical decision making was performed.   Future Appointments  Date Time Provider [place]  03/30/2022  3:10 PM [facility] DIAG [place] 2 [facility]  03/30/2022  3:20 PM [facility] [place] 2 [facility]   05/10/2022  9:00 AM [place], Tonya, NP [facility] None    ------------------------------------------------------------------------------------------------------------------   HPI BP 110/70   Pulse 82   Temp 97.7 F (36.5 C)   Ht '5\' 4"'$  (1.626 m)   Wt 215 lb 12.8 oz (97.9 kg)   SpO2 98%   BMI 37.40 kg/m   41 year old female who presents for reevaluation of ADHD, cholesterol, TMJ   BMI is Body mass index is 37.04 kg/m., she has been working on diet and exercise. Wt Readings from Last 3 Encounters:  03/20/22 215 lb 12.8 oz (97.9 kg)  12/07/21 219 lb (99.3 kg)  09/21/21 215 lb (97.5 kg)   She has noticed the Adderall does help but wonders if the dose is not high enough.  She does have more organization of her thoughts but still has difficulty focusing and completing tasks.   She does use Flexeril intermittently for TMJ [ain and it does help.,She also wears a night guard intermittently.  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   B Complex Vitamins (B COMPLEX PO) Take by mouth. PRN   Cholecalciferol (VITAMIN D) 125 MCG (5000 UT) CAPS Take by mouth daily.   IBUPROFEN PO Take by mouth. PRN   sertraline  (ZOLOFT) 100 MG tablet Takes '200mg'$    topiramate (TOPAMAX) 50 MG tablet TAKE ONE HALF TO ONE TABLET BY MOUTH TWICE DAILY AT SUPPER AND AT BEDTIME FOR DIETING AND WEIGHT LOSS   verapamil (CALAN-SR) 240 MG CR tablet TAKE ONE TABLET BY MOUTH EVERY NIGHT AT BEDTIME   cyclobenzaprine (FLEXERIL) 5 MG tablet Take 1 tablet (5 mg total) by mouth 3 (three) times daily as needed for muscle spasms. (Patient not taking: Reported on 03/20/2022)   NON FORMULARY Diltiazem 2% compound Use 3 x rectally daily for 2 months to heal anal fissure (Patient not taking: Reported on 03/20/2022)   No current facility-administered medications on file prior to visit.    Review of Systems  Constitutional:  Negative for chills, fever and weight loss.  HENT:  Negative for congestion and hearing loss.        Jaw pain from TMJ   Eyes:  Negative for blurred vision and double vision.  Respiratory:  Negative for cough and shortness of breath.   Cardiovascular:  Negative for chest pain, palpitations, orthopnea and leg swelling.  Gastrointestinal:  Negative for abdominal pain, constipation, diarrhea, heartburn, nausea and vomiting.  Musculoskeletal:  Negative for falls, joint pain and myalgias.  Skin:  Negative for rash.  Neurological:  Negative for dizziness, tingling, tremors, loss of consciousness and headaches.  Psychiatric/Behavioral:  Negative for depression, memory loss and suicidal ideas.        ADD     Physical Exam:  BP 110/70   Pulse 82   Temp 97.7 F (36.5 C)   Ht '5\' 4"'$  (1.626 m)   Wt 215 lb 12.8 oz (97.9 kg)   SpO2 98%   BMI 37.04 kg/m   General Appearance: Very pleasant obese female, in no apparent distress. Eyes: PERRLA, EOMs, conjunctiva no swelling or erythema Sinuses: No Frontal/maxillary tenderness ENT/Mouth: Ext aud canals clear, TMs without erythema, bulging. No erythema, swelling, or exudate on post pharynx.  Tonsils not swollen or erythematous. Hearing normal.  Neck: Supple, thyroid normal.   Respiratory: Respiratory effort normal, BS equal bilaterally without rales, rhonchi, wheezing or stridor.  Cardio: RRR with no MRGs. Brisk peripheral pulses without edema.  Abdomen: Soft, + BS.  Non tender, no guarding, rebound, hernias, masses. Lymphatics: Non tender without lymphadenopathy.  Musculoskeletal: Full ROM, 5/5 strength, normal gait.  Skin: Warm, dry without rashes, lesions, ecchymosis.  Neuro: Cranial nerves intact. Normal muscle tone, no cerebellar symptoms. Sensation intact.  Psych: Awake and oriented X 3, normal affect, Insight and Judgment appropriate.     Alycia Rossetti, NP 8:54 AM [facility]

## 2022-03-21 LAB — COMPLETE METABOLIC PANEL WITH GFR
AG Ratio: 1.3 (calc) (ref 1.0–2.5)
ALT: 20 U/L (ref 6–29)
AST: 14 U/L (ref 10–30)
Albumin: 4.2 g/dL (ref 3.6–5.1)
Alkaline phosphatase (APISO): 41 U/L (ref 31–125)
BUN: 10 mg/dL (ref 7–25)
CO2: 24 mmol/L (ref 20–32)
Calcium: 9.4 mg/dL (ref 8.6–10.2)
Chloride: 107 mmol/L (ref 98–110)
Creat: 0.74 mg/dL (ref 0.50–0.97)
Globulin: 3.2 g/dL (calc) (ref 1.9–3.7)
Glucose, Bld: 94 mg/dL (ref 65–99)
Potassium: 4 mmol/L (ref 3.5–5.3)
Sodium: 137 mmol/L (ref 135–146)
Total Bilirubin: 0.3 mg/dL (ref 0.2–1.2)
Total Protein: 7.4 g/dL (ref 6.1–8.1)
eGFR: 105 mL/min/{1.73_m2} (ref >=60)

## 2022-03-21 LAB — CBC WITH DIFFERENTIAL/PLATELET
Absolute Monocytes: 673 cells/uL (ref 200–950)
Basophils Absolute: 64 cells/uL (ref 0–200)
Basophils Relative: 0.7 %
Eosinophils Absolute: 137 cells/uL (ref 15–500)
Eosinophils Relative: 1.5 %
HCT: 39 % (ref 35.0–45.0)
Hemoglobin: 13 g/dL (ref 11.7–15.5)
Lymphs Abs: 2603 cells/uL (ref 850–3900)
MCH: 30.5 pg (ref 27.0–33.0)
MCHC: 33.3 g/dL (ref 32.0–36.0)
MCV: 91.5 fL (ref 80.0–100.0)
MPV: 11.1 fL (ref 7.5–12.5)
Monocytes Relative: 7.4 %
Neutro Abs: 5624 cells/uL (ref 1500–7800)
Neutrophils Relative %: 61.8 %
Platelets: 283 10*3/uL (ref 140–400)
RBC: 4.26 10*6/uL (ref 3.80–5.10)
RDW: 12.4 % (ref 11.0–15.0)
Total Lymphocyte: 28.6 %
WBC: 9.1 10*3/uL (ref 3.8–10.8)

## 2022-03-21 LAB — LIPID PANEL
Cholesterol: 186 mg/dL (ref <200)
HDL: 52 mg/dL (ref >=50)
LDL Cholesterol (Calc): 110 mg/dL (calc) — ABNORMAL HIGH
Non-HDL Cholesterol (Calc): 134 mg/dL (calc) — ABNORMAL HIGH (ref <130)
Total CHOL/HDL Ratio: 3.6 (calc) (ref <5.0)
Triglycerides: 125 mg/dL (ref <150)

## 2022-03-21 LAB — TSH: TSH: 1.73 mIU/L

## 2022-03-29 ENCOUNTER — Other Ambulatory Visit: Payer: Self-pay

## 2022-03-29 MED ORDER — SERTRALINE HCL 100 MG PO TABS: ORAL_TABLET | ORAL | 2 refills | Status: DC

## 2022-03-30 ENCOUNTER — Ambulatory Visit
Admission: RE | Admit: 2022-03-30 | Discharge: 2022-03-30 | Disposition: A | Discharge status: Home / Self Care | Payer: No Typology Code available for payment source | Source: Ambulatory Visit | Attending: Obstetrics and Gynecology | Admitting: Obstetrics and Gynecology

## 2022-03-30 DIAGNOSIS — D1721 Benign lipomatous neoplasm of skin and subcutaneous tissue of right arm: Secondary | ICD-10-CM

## 2022-04-19 ENCOUNTER — Other Ambulatory Visit: Payer: Self-pay

## 2022-04-19 DIAGNOSIS — I10 Essential (primary) hypertension: Secondary | ICD-10-CM

## 2022-04-19 MED ORDER — VERAPAMIL HCL ER 240 MG PO TBCR: 240.0000 mg | EXTENDED_RELEASE_TABLET | Freq: Every day | ORAL | 1 refills | Status: DC

## 2022-05-10 ENCOUNTER — Encounter: Payer: No Typology Code available for payment source | Admitting: Nurse Practitioner

## 2022-05-11 ENCOUNTER — Encounter: Payer: No Typology Code available for payment source | Admitting: Nurse Practitioner

## 2022-06-12 NOTE — Progress Notes (Unsigned)
Complete Physical  Assessment and Plan:  Kimberly Howard was seen today for annual exam.  Diagnoses and all orders for this visit:  Encounter for Annual Physical Exam with abnormal findings Due annually  Health Maintenance reviewed Healthy lifestyle reviewed and goals set  Essential hypertension Continue medication: verapmil for headaches/BP Monitor blood pressure at home; call if consistently over 130/80 Continue DASH diet.   Reminder to go to the ER if any CP, SOB, nausea, dizziness, severe HA, changes vision/speech, left arm numbness and tingling and jaw pain. -     CBC with Diff -     COMPLETE METABOLIC PANEL WITH GFR -     Magnesium -     TSH -     Urinalysis, Routine w reflex microscopic  Other migraine without status migrainosus, not intractable Recently improved; continue verapmil; headaches resolve with OTC NSAIDs  TMJ (temporomandibular joint syndrome) Improved with lifestyle; follows dentist; flexeril PRN is helpful if needed, refilled. Will refer to PT for dry needling if needed.   Obesity Long discussion about weight loss, diet, and exercise Discussed goal weight and initial weight loss goal (<200lb) Patient will work on portions, increase exercise Patient on phentermine/topamax with benefit and no SE, taking drug breaks; continue close follow up.        -     phentermine (ADIPEX-P) 37.5 MG tablet; Take 1/2 to 1 tablet every morning as needed for dieting & weight loss. Take regular drug breaks. -     Hemoglobin A1c ([facility]) - defer per discussion with patient  Depression, unspecified depression type Continue sertraline; encouraged CBT, chronic anxiety, home stress Lifestyle discussed: diet/exerise, sleep hygiene, stress management, hydration  Vitamin D deficiency -     Vitamin D (25 hydroxy)  Cholelithiasis Low fat diet encouraged; deferring due to insurance and scheduling, has seen gen surgery.  -     COMPLETE METABOLIC PANEL WITH GFR  Screening for diabetes  mellitus -     Hemoglobin A1c ([facility])  Mixed Hyperlipidemia Continue diet and exercise -     Lipid Profile  Screening for thyroid disorder -     TSH  Screening for hematuria/proteinuria - UA with Microscopic + reflex culture    Discussed med's effects and SE's. Screening labs and tests as requested with regular follow-up as recommended. Over 40 minutes of exam, counseling, chart review, and complex, high level critical decision making was performed this visit.   Future Appointments  Date Time Provider [place]  06/13/2022  9:00 AM Alycia Rossetti, NP [facility] None    HPI  42 y.o. female  presents for a complete physical and follow up for has Essential hypertension; Migraine; TMJ (temporomandibular joint syndrome); Obesity (BMI 30.0-34.9); Vitamin D deficiency; Cholelithiases; Depression; and Hypertriglyceridemia on their problem list.   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 Follows with GYN [name], MD for GYN last in fall 2022.   Lots of stress with work, stress with special needs kids, some marital stress -no benefit with wellbutrin 150 mg, lexapro 20 mg, buspirone 5 mg TID (sedation), Effexor, now on sertraline 100 mg and dose feel this helps some with day to day, but still with some persistent anxiety. Difficult life situation, have discussed therapy, has looked into this but has a lot going on with kids, discussed further today, does  plan to pursue once able.   Migraine - remote; recently with frontal headaches only, resolve with aleve quickly. She does have TMJ, has done flexeril PRN in the past which has been helpful, would like to repeat.   She was recently referred to Gen surgery Dr. Redmond Pulling for cholelithiasis, discussing lab chole  but still pending due to scheduling issues. She also reports has been tracking stools etc more closely, was having loose stools and gas, has improved with reducing red meat intake, but notes intermittent ribbon like stools alternates with bristol type 5. Note early colon cancer hx in maternal cousin.    She is prescribed phentermine and topiramate for weight loss. She takes phentermine whole tab 2-3 tabs per week. While on the medication they have lost 0 lbs since last visit. Denies palpitations, anxiety, trouble sleeping, elevated BP.   BMI is There is no height or weight on file to calculate BMI., she has been working on diet and exercise. [place] bike when has time, admits has been baking.  Wt Readings from Last 3 Encounters:  03/20/22 215 lb 12.8 oz (97.9 kg)  12/07/21 219 lb (99.3 kg)  09/21/21 215 lb (97.5 kg)   She has not been checking BP at home, does have cuff, today their BP is   She does workout. She denies chest pain, shortness of breath, dizziness. Reports occasional fluttering, 5 sec at rest, intermittent, no changes for several years.     She is not on cholesterol medication. Her cholesterol is at goal. The cholesterol last visit was:   Lab Results  Component Value Date   CHOL 186 03/20/2022   HDL 52 03/20/2022   LDLCALC 110 (H) 03/20/2022   TRIG 125 03/20/2022   CHOLHDL 3.6 03/20/2022   She has been working on diet and exercise for glucose management. Last A1C in the office was:  Lab Results  Component Value Date   HGBA1C 5.4 04/29/2020   Last GFR: Lab Results  Component Value Date   GFRAA 108 04/29/2020   Patient is currently on Vitamin D, taking 10000 IU daily:    Lab Results  Component Value Date   VD25OH 26 (L) 12/07/2021     Lab Results  Component Value Date   [id number] 04/29/2020      Current Medications:  Current Outpatient Medications on File Prior to Visit  Medication Sig Dispense Refill   amphetamine-dextroamphetamine (ADDERALL XR) 30 MG  24 hr capsule Take 1 capsule (30 mg total) by mouth daily. 30 capsule 0   B Complex Vitamins (B COMPLEX PO) Take by mouth. PRN     Cholecalciferol (VITAMIN D) 125 MCG (5000 UT) CAPS Take by mouth daily.     cyclobenzaprine (FLEXERIL) 5 MG tablet Take 1 tablet (5 mg total) by mouth 3 (three) times daily as needed for muscle spasms. 60 tablet 0   IBUPROFEN PO Take by mouth. PRN     NORETHINDRONE PO norethindrone (contraceptive) 0.35 mg tablet  Take one tablet po daily.     sertraline (ZOLOFT) 100 MG tablet Take one tablet daily 90 tablet 2   topiramate (TOPAMAX) 50 MG tablet TAKE ONE HALF TO ONE TABLET BY MOUTH TWICE DAILY AT SUPPER AND AT BEDTIME FOR DIETING AND WEIGHT LOSS 180 tablet 1   verapamil (CALAN-SR) 240 MG CR tablet Take 1 tablet (240 mg total) by mouth at bedtime. 90 tablet 1   No current facility-administered medications on file prior to visit.   Allergies:  Allergies  Allergen Reactions  Adhesive [Tape]    Medical History:  She has Essential hypertension; Migraine; TMJ (temporomandibular joint syndrome); Obesity (BMI 30.0-34.9); Vitamin D deficiency; Cholelithiases; Depression; and Hypertriglyceridemia on their problem list.  Health Maintenance:   Immunization History  Administered Date(s) Administered   Influenza,inj,Quad PF,6+ Mos 01/21/2019, 03/01/2021, 03/20/2022   Influenza-Unspecified 02/05/2018   PFIZER(Purple Top)SARS-COV-2 Vaccination 07/10/2019, 08/04/2019, 03/07/2020   Tdap 04/07/2013, 09/30/2018   Health Maintenance  Topic Date Due   COVID-19 Vaccine (4 - 2023-24 season) 12/23/2021   PAP SMEAR-Modifier  08/20/2022   DTaP/Tdap/Td (3 - Td or Tdap) 09/29/2028   INFLUENZA VACCINE  Completed   Hepatitis C Screening  Completed   HIV Screening  Completed   HPV VACCINES  Aged Out   Last Dental Exam: q29m last 2022 Last Eye Exam: wears glasses, last 2022  Patient Care Team: MUnk Pinto MD as PCP - General (Internal Medicine) RPaula Compton MD as  Consulting Physician (Obstetrics and Gynecology)  Surgical History:  She has a past surgical history that includes Nasal septum surgery (03/2003) and Cesarean section (N/A, 12/06/2018). Family History:  Herfamily history includes Asthma in her sister and sister; CVA in her maternal grandfather; Cancer in her paternal grandmother; Colon cancer (age of onset: 350 in her cousin; Colon polyps in her father; Diabetes in her maternal grandmother and mother; Diverticulitis in her mother; Gallstones in her mother; Heart murmur in her son; Hyperlipidemia in her father; Hypertension in her brother, maternal grandmother, and mother; Mental illness in her maternal grandmother; Other in her son; Pancreatitis in her mother. Social History:  She reports that she has never smoked. She has never used smokeless tobacco. She reports current alcohol use. She reports that she does not use drugs.  Review of Systems: Review of Systems  Constitutional:  Negative for malaise/fatigue and weight loss.  HENT:  Negative for hearing loss and tinnitus.   Eyes:  Negative for blurred vision and double vision.  Respiratory:  Negative for cough, shortness of breath and wheezing.   Cardiovascular:  Negative for chest pain, palpitations, orthopnea, claudication and leg swelling.  Gastrointestinal:  Negative for abdominal pain, blood in stool, constipation, diarrhea, heartburn, melena, nausea and vomiting.  Genitourinary: Negative.   Musculoskeletal:  Negative for joint pain and myalgias.  Skin:  Negative for rash.  Neurological:  Positive for headaches (occasional migraines). Negative for dizziness, tingling, sensory change and weakness.  Endo/Heme/Allergies:  Negative for polydipsia.  Psychiatric/Behavioral:  Negative for depression, substance abuse and suicidal ideas. The patient is not nervous/anxious and does not have insomnia (intermittent when stressed).   All other systems reviewed and are negative.   Physical  Exam: Estimated body mass index is 37.04 kg/m as calculated from the following:   Height as of 03/20/22: 5' 4"$  (1.626 m).   Weight as of 03/20/22: 215 lb 12.8 oz (97.9 kg). There were no vitals taken for this visit. General Appearance: Well nourished, well dressed young [place] female in no apparent distress.  Eyes: PERRLA, EOMs, conjunctiva no swelling or erythema Sinuses: No Frontal/maxillary tenderness  ENT/Mouth: Ext aud canals clear, normal light reflex with TMs without erythema, bulging. Good dentition. No erythema, swelling, or exudate on post pharynx. Tonsils not swollen or erythematous. Hearing normal.  Neck: Supple, thyroid normal. No bruits  Respiratory: Respiratory effort normal, BS equal bilaterally without rales, rhonchi, wheezing or stridor.  Cardio: RRR without murmurs, rubs or gallops. Brisk peripheral pulses without edema.  Chest: symmetric, with normal excursions and percussion.  Breasts: Defer to GYN Abdomen:  Soft, vague tenderness with deep palpation LLQ and RLQ without palpable mass/organs, no guarding, rebound, hernias, or organomegaly.  Lymphatics: Non tender without lymphadenopathy.  Genitourinary: Defer to GYN  Musculoskeletal: Full ROM all peripheral extremities,5/5 strength, and normal gait.  Skin: Warm, dry without rashes, lesions, ecchymosis. Neuro: Cranial nerves intact, reflexes equal bilaterally. Normal muscle tone, no cerebellar symptoms. Sensation intact.  Psych: Awake and oriented X 3, mildly depressed affect, Insight and Judgment appropriate.   EKG: WNL no ST changes in 2020 reviewed; defer.   Nakeshia Waldeck E  10:41 AM [facility]

## 2022-06-13 ENCOUNTER — Ambulatory Visit: Payer: No Typology Code available for payment source | Admitting: Nurse Practitioner

## 2022-06-13 ENCOUNTER — Encounter: Payer: Self-pay | Admitting: Nurse Practitioner

## 2022-06-13 VITALS — BP 102/72 | HR 103 | Temp 98.1°F | Ht 64.0 in | Wt 215.0 lb

## 2022-06-13 DIAGNOSIS — Z6837 Body mass index (BMI) 37.0-37.9, adult: Secondary | ICD-10-CM

## 2022-06-13 DIAGNOSIS — G43809 Other migraine, not intractable, without status migrainosus: Secondary | ICD-10-CM

## 2022-06-13 DIAGNOSIS — F32A Depression, unspecified: Secondary | ICD-10-CM

## 2022-06-13 DIAGNOSIS — M26609 Unspecified temporomandibular joint disorder, unspecified side: Secondary | ICD-10-CM

## 2022-06-13 DIAGNOSIS — Z0001 Encounter for general adult medical examination with abnormal findings: Secondary | ICD-10-CM

## 2022-06-13 DIAGNOSIS — Z131 Encounter for screening for diabetes mellitus: Secondary | ICD-10-CM

## 2022-06-13 DIAGNOSIS — F900 Attention-deficit hyperactivity disorder, predominantly inattentive type: Secondary | ICD-10-CM

## 2022-06-13 DIAGNOSIS — Z1389 Encounter for screening for other disorder: Secondary | ICD-10-CM | POA: Diagnosis not present

## 2022-06-13 DIAGNOSIS — Z79899 Other long term (current) drug therapy: Secondary | ICD-10-CM | POA: Diagnosis not present

## 2022-06-13 DIAGNOSIS — E559 Vitamin D deficiency, unspecified: Secondary | ICD-10-CM

## 2022-06-13 DIAGNOSIS — Z0000 Encounter for general adult medical examination without abnormal findings: Secondary | ICD-10-CM | POA: Diagnosis not present

## 2022-06-13 DIAGNOSIS — Z13220 Encounter for screening for lipoid disorders: Secondary | ICD-10-CM

## 2022-06-13 DIAGNOSIS — I10 Essential (primary) hypertension: Secondary | ICD-10-CM

## 2022-06-13 DIAGNOSIS — Z1329 Encounter for screening for other suspected endocrine disorder: Secondary | ICD-10-CM

## 2022-06-13 DIAGNOSIS — E782 Mixed hyperlipidemia: Secondary | ICD-10-CM

## 2022-06-13 DIAGNOSIS — K8020 Calculus of gallbladder without cholecystitis without obstruction: Secondary | ICD-10-CM

## 2022-06-13 DIAGNOSIS — Q831 Accessory breast: Secondary | ICD-10-CM

## 2022-06-13 MED ORDER — AMPHETAMINE-DEXTROAMPHET ER 30 MG PO CP24: 30.0000 mg | ORAL_CAPSULE | Freq: Every day | ORAL | 0 refills | Status: DC

## 2022-06-13 MED ORDER — BUTALBITAL-APAP-CAFFEINE 50-325-40 MG PO TABS: 1.0000 | ORAL_TABLET | Freq: Four times a day (QID) | ORAL | 0 refills | Status: AC | PRN

## 2022-06-13 MED ORDER — SERTRALINE HCL 100 MG PO TABS: ORAL_TABLET | ORAL | 2 refills | Status: DC

## 2022-06-13 NOTE — Patient Instructions (Signed)

## 2022-06-15 ENCOUNTER — Encounter: Payer: Self-pay | Admitting: Nurse Practitioner

## 2022-06-17 ENCOUNTER — Other Ambulatory Visit: Payer: Self-pay | Admitting: Nurse Practitioner

## 2022-06-17 DIAGNOSIS — Z6835 Body mass index (BMI) 35.0-35.9, adult: Secondary | ICD-10-CM

## 2022-06-17 LAB — CBC WITH DIFFERENTIAL/PLATELET
Absolute Monocytes: 663 cells/uL (ref 200–950)
Basophils Absolute: 34 cells/uL (ref 0–200)
Basophils Relative: 0.4 %
Eosinophils Absolute: 119 cells/uL (ref 15–500)
Eosinophils Relative: 1.4 %
HCT: 40 % (ref 35.0–45.0)
Hemoglobin: 13.3 g/dL (ref 11.7–15.5)
Lymphs Abs: 2780 cells/uL (ref 850–3900)
MCH: 29.5 pg (ref 27.0–33.0)
MCHC: 33.3 g/dL (ref 32.0–36.0)
MCV: 88.7 fL (ref 80.0–100.0)
MPV: 10.9 fL (ref 7.5–12.5)
Monocytes Relative: 7.8 %
Neutro Abs: 4905 cells/uL (ref 1500–7800)
Neutrophils Relative %: 57.7 %
Platelets: 288 10*3/uL (ref 140–400)
RBC: 4.51 10*6/uL (ref 3.80–5.10)
RDW: 12.3 % (ref 11.0–15.0)
Total Lymphocyte: 32.7 %
WBC: 8.5 10*3/uL (ref 3.8–10.8)

## 2022-06-17 LAB — LIPID PANEL
Cholesterol: 202 mg/dL — ABNORMAL HIGH (ref <200)
HDL: 43 mg/dL — ABNORMAL LOW (ref >=50)
LDL Cholesterol (Calc): 128 mg/dL (calc) — ABNORMAL HIGH
Non-HDL Cholesterol (Calc): 159 mg/dL (calc) — ABNORMAL HIGH (ref <130)
Total CHOL/HDL Ratio: 4.7 (calc) (ref <5.0)
Triglycerides: 194 mg/dL — ABNORMAL HIGH (ref <150)

## 2022-06-17 LAB — HEMOGLOBIN A1C
Hgb A1c MFr Bld: 5.9 % of total Hgb — ABNORMAL HIGH (ref <5.7)
Mean Plasma Glucose: 123 mg/dL
eAG (mmol/L): 6.8 mmol/L

## 2022-06-17 LAB — URINALYSIS W MICROSCOPIC + REFLEX CULTURE
Bacteria, UA: NONE SEEN /HPF
Bilirubin Urine: NEGATIVE
Glucose, UA: NEGATIVE
Hgb urine dipstick: NEGATIVE
Hyaline Cast: NONE SEEN /LPF
Ketones, ur: NEGATIVE
Nitrites, Initial: NEGATIVE
Protein, ur: NEGATIVE
RBC / HPF: NONE SEEN /HPF (ref 0–2)
Specific Gravity, Urine: 1.014 (ref 1.001–1.035)
pH: 5.5 (ref 5.0–8.0)

## 2022-06-17 LAB — COMPLETE METABOLIC PANEL WITH GFR
AG Ratio: 1.3 (calc) (ref 1.0–2.5)
ALT: 52 U/L — ABNORMAL HIGH (ref 6–29)
AST: 31 U/L — ABNORMAL HIGH (ref 10–30)
Albumin: 4.3 g/dL (ref 3.6–5.1)
Alkaline phosphatase (APISO): 54 U/L (ref 31–125)
BUN: 7 mg/dL (ref 7–25)
CO2: 21 mmol/L (ref 20–32)
Calcium: 9.5 mg/dL (ref 8.6–10.2)
Chloride: 108 mmol/L (ref 98–110)
Creat: 0.74 mg/dL (ref 0.50–0.97)
Globulin: 3.3 g/dL (calc) (ref 1.9–3.7)
Glucose, Bld: 103 mg/dL — ABNORMAL HIGH (ref 65–99)
Potassium: 3.8 mmol/L (ref 3.5–5.3)
Sodium: 139 mmol/L (ref 135–146)
Total Bilirubin: 0.4 mg/dL (ref 0.2–1.2)
Total Protein: 7.6 g/dL (ref 6.1–8.1)
eGFR: 105 mL/min/{1.73_m2} (ref >=60)

## 2022-06-17 LAB — TSH: TSH: 1.14 mIU/L

## 2022-06-17 LAB — URINE CULTURE
MICRO NUMBER:: 14591705
Result:: NO GROWTH
SPECIMEN QUALITY:: ADEQUATE

## 2022-06-17 LAB — CULTURE INDICATED

## 2022-06-17 LAB — MITOCHONDRIAL AB RFLX TITR: Mitochondrial Antibody Screen: NEGATIVE

## 2022-06-17 LAB — MAGNESIUM: Magnesium: 1.6 mg/dL (ref 1.5–2.5)

## 2022-06-17 LAB — VITAMIN D 25 HYDROXY (VIT D DEFICIENCY, FRACTURES): Vit D, 25-Hydroxy: 37 ng/mL (ref 30–100)

## 2022-12-05 ENCOUNTER — Other Ambulatory Visit: Payer: Self-pay | Admitting: Nurse Practitioner

## 2022-12-05 DIAGNOSIS — F900 Attention-deficit hyperactivity disorder, predominantly inattentive type: Secondary | ICD-10-CM

## 2022-12-05 MED ORDER — AMPHETAMINE-DEXTROAMPHET ER 30 MG PO CP24: 30.0000 mg | ORAL_CAPSULE | Freq: Every day | ORAL | 0 refills | Status: DC

## 2022-12-14 ENCOUNTER — Ambulatory Visit: Payer: No Typology Code available for payment source | Admitting: Nurse Practitioner

## 2022-12-18 NOTE — Progress Notes (Signed)
Surgery orders requested via Epic inbox. °

## 2022-12-21 NOTE — Progress Notes (Signed)
Please send pre op orders for PST visit 12/26/22

## 2022-12-21 NOTE — Progress Notes (Addendum)
COVID Vaccine received:  []  No [x]  Yes Date of any COVID positive Test in last 90 days: No PCP - Lucky Cowboy MD Cardiologist - no  Chest x-ray - no EKG -   Stress Test - no ECHO - no Cardiac Cath - no  Bowel Prep - [x]  No  []   Yes ______  Pacemaker / ICD device [x]  No []  Yes   Spinal Cord Stimulator:[x]  No []  Yes       History of Sleep Apnea? [x]  No []  Yes   CPAP used?- [x]  No []  Yes    Does the patient monitor blood sugar?          [x]  No []  Yes  []  N/A  Patient has: [x]  NO Hx DM   []  Pre-DM                 []  DM1  []   DM2 Does patient have a [employer] or Dexacom? []  No []  Yes   Fasting Blood Sugar Ranges-  Checks Blood Sugar _____ times a day  GLP1 agonist / usual dose - no GLP1 instructions:  SGLT-2 inhibitors / usual dose - no SGLT-2 instructions:   Blood Thinner / Instructions:no Aspirin Instructions:no  Comments:   Activity level: Patient is ableto climb a flight of stairs without difficulty; [x]  No CP  [x]  No SOB, but would have ___   Patient can perform ADLs without assistance.   Anesthesia review:   Patient denies shortness of breath, fever, cough and chest pain at PAT appointment.  Patient verbalized understanding and agreement to the Pre-Surgical Instructions that were given to them at this PAT appointment. Patient was also educated of the need to review these PAT instructions again prior to his/her surgery.I reviewed the appropriate phone numbers to call if they have any and questions or concerns.

## 2022-12-22 NOTE — Patient Instructions (Addendum)
SURGICAL WAITING ROOM VISITATION  Patients having surgery or a procedure may have no more than 2 support people in the waiting area - these visitors may rotate.    Children under the age of 27 must have an adult with them who is not the patient.  Due to an increase in RSV and influenza rates and associated hospitalizations, children ages 40 and under may not visit patients in [facility] hospitals.  If the patient needs to stay at the hospital during part of their recovery, the visitor guidelines for inpatient rooms apply. Pre-op nurse will coordinate an appropriate time for 1 support person to accompany patient in pre-op.  This support person may not rotate.    Please refer to the [facility] website for the visitor guidelines for Inpatients (after your surgery is over and you are in a regular room).       Your procedure is scheduled on: 01/04/23   Report to [facility] Main Entrance    Report to admitting at 9:45 AM   Call this number if you have problems the morning of surgery [phone]   Do not eat food or drink liquids:After Midnight.    FOLLOW BOWEL PREP AND ANY ADDITIONAL PRE OP INSTRUCTIONS YOU RECEIVED FROM YOUR SURGEON'S OFFICE!!!     Oral Hygiene is also important to reduce your risk of infection.                                    Remember - BRUSH YOUR TEETH THE MORNING OF SURGERY WITH YOUR REGULAR TOOTHPASTE   Stop all vitamins and herbal supplements 7 days before surgery.   Take these medicines the morning of surgery: Sertraline, Verapamil, Tylenol if needed.             You may not have any metal on your body including hair pins, jewelry, and body piercing             Do not wear make-up, lotions, powders, perfumes or deodorant  Do not wear nail polish including gel and S&S, artificial/acrylic nails, or any other type of covering on natural nails including finger and toenails. If you have artificial nails, gel coating, etc. that needs to be removed by  a nail salon please have this removed prior to surgery or surgery may need to be canceled/ delayed if the surgeon/ anesthesia feels like they are unable to be safely monitored.   Do not shave  48 hours prior to surgery.    Do not bring valuables to the hospital. [facility] IS NOT             RESPONSIBLE   FOR VALUABLES.   Contacts, glasses, dentures or bridgework may not be worn into surgery.  DO NOT BRING YOUR HOME MEDICATIONS TO THE HOSPITAL. PHARMACY WILL DISPENSE MEDICATIONS LISTED ON YOUR MEDICATION LIST TO YOU DURING YOUR ADMISSION IN THE HOSPITAL!    Patients discharged on the day of surgery will not be allowed to drive home.  Someone NEEDS to stay with you for the first 24 hours after anesthesia.   Special Instructions: Bring a copy of your healthcare power of attorney and living will documents the day of surgery if you haven't scanned them before.              Please read over the following fact sheets you were given: IF YOU HAVE QUESTIONS ABOUT YOUR PRE-OP INSTRUCTIONS PLEASE CALL  [phone] Rosey [place]   If you received a COVID test during your pre-op visit  it is requested that you wear a mask when out in public, stay away from anyone that may not be feeling well and notify your surgeon if you develop symptoms. If you test positive for Covid or have been in contact with anyone that has tested positive in the last 10 days please notify you surgeon.    [facility] - Preparing for Surgery Before surgery, you can play an important role.  Because skin is not sterile, your skin needs to be as free of germs as possible.  You can reduce the number of germs on your skin by washing with CHG (chlorahexidine gluconate) soap before surgery.  CHG is an antiseptic cleaner which kills germs and bonds with the skin to continue killing germs even after washing. Please DO NOT use if you have an allergy to CHG or antibacterial soaps.  If your skin becomes reddened/irritated stop using the CHG and inform  your nurse when you arrive at Short Stay. Do not shave (including legs and underarms) for at least 48 hours prior to the first CHG shower.  You may shave your face/neck.  Please follow these instructions carefully:  1.  Shower with CHG Soap the night before surgery and the  morning of surgery.  2.  If you choose to wash your hair, wash your hair first as usual with your normal  shampoo.  3.  After you shampoo, rinse your hair and body thoroughly to remove the shampoo.                             4.  Use CHG as you would any other liquid soap.  You can apply chg directly to the skin and wash.  Gently with a scrungie or clean washcloth.  5.  Apply the CHG Soap to your body ONLY FROM THE NECK DOWN.   Do   not use on face/ open                           Wound or open sores. Avoid contact with eyes, ears mouth and   genitals (private parts).                       Wash face,  Genitals (private parts) with your normal soap.             6.  Wash thoroughly, paying special attention to the area where your    surgery  will be performed.  7.  Thoroughly rinse your body with warm water from the neck down.  8.  DO NOT shower/wash with your normal soap after using and rinsing off the CHG Soap.                9.  Pat yourself dry with a clean towel.            10.  Wear clean pajamas.            11.  Place clean sheets on your bed the night of your first shower and do not  sleep with pets. Day of Surgery : Do not apply any lotions/deodorants the morning of surgery.  Please wear clean clothes to the hospital/surgery center.  FAILURE TO FOLLOW THESE INSTRUCTIONS MAY RESULT IN THE CANCELLATION OF YOUR SURGERY  PATIENT SIGNATURE_________________________________  NURSE SIGNATURE__________________________________  ________________________________________________________________________ 

## 2022-12-26 ENCOUNTER — Other Ambulatory Visit: Payer: Self-pay

## 2022-12-26 ENCOUNTER — Encounter (HOSPITAL_COMMUNITY): Payer: Self-pay

## 2022-12-26 ENCOUNTER — Encounter (HOSPITAL_COMMUNITY)
Admission: RE | Admit: 2022-12-26 | Discharge: 2022-12-26 | Disposition: A | Discharge status: Home / Self Care | Payer: No Typology Code available for payment source | Source: Ambulatory Visit | Attending: General Surgery | Admitting: General Surgery

## 2022-12-26 VITALS — BP 139/81 | HR 96 | Temp 98.7°F | Resp 16 | Ht 64.0 in | Wt 223.0 lb

## 2022-12-26 DIAGNOSIS — I10 Essential (primary) hypertension: Secondary | ICD-10-CM | POA: Diagnosis not present

## 2022-12-26 DIAGNOSIS — Z01818 Encounter for other preprocedural examination: Secondary | ICD-10-CM | POA: Diagnosis present

## 2022-12-26 LAB — CBC
HCT: 40.8 % (ref 36.0–46.0)
Hemoglobin: 13.1 g/dL (ref 12.0–15.0)
MCH: 29.7 pg (ref 26.0–34.0)
MCHC: 32.1 g/dL (ref 30.0–36.0)
MCV: 92.5 fL (ref 80.0–100.0)
Platelets: 255 10*3/uL (ref 150–400)
RBC: 4.41 MIL/uL (ref 3.87–5.11)
RDW: 12.9 % (ref 11.5–15.5)
WBC: 12.9 10*3/uL — ABNORMAL HIGH (ref 4.0–10.5)
nRBC: 0 % (ref 0.0–0.2)

## 2022-12-26 LAB — BASIC METABOLIC PANEL
Anion gap: 10 (ref 5–15)
BUN: 10 mg/dL (ref 6–20)
CO2: 23 mmol/L (ref 22–32)
Calcium: 9.4 mg/dL (ref 8.9–10.3)
Chloride: 103 mmol/L (ref 98–111)
Creatinine, Ser: 0.65 mg/dL (ref 0.44–1.00)
GFR, Estimated: >60 mL/min (ref >60)
Glucose, Bld: 104 mg/dL — ABNORMAL HIGH (ref 70–99)
Potassium: 3.9 mmol/L (ref 3.5–5.1)
Sodium: 136 mmol/L (ref 135–145)

## 2023-01-01 ENCOUNTER — Ambulatory Visit: Payer: No Typology Code available for payment source | Admitting: Nurse Practitioner

## 2023-01-01 ENCOUNTER — Ambulatory Visit: Payer: Self-pay | Admitting: General Surgery

## 2023-01-03 NOTE — Anesthesia Preprocedure Evaluation (Signed)
Anesthesia Evaluation  Patient identified by MRN, date of birth, ID band Patient awake    Reviewed: Allergy & Precautions, NPO status , Patient's Chart, lab work & pertinent test results  Airway Mallampati: II  TM Distance: >3 FB Neck ROM: Full    Dental no notable dental hx. (+) Teeth Intact, Dental Advisory Given   Pulmonary neg pulmonary ROS   Pulmonary exam normal breath sounds clear to auscultation       Cardiovascular hypertension, Normal cardiovascular exam Rhythm:Regular Rate:Normal     Neuro/Psych  Headaches    GI/Hepatic negative GI ROS, Neg liver ROS,,,  Endo/Other    Renal/GU Lab Results      Component                Value               Date                     K                        3.9                 12/26/2022                CREATININE               0.65                12/26/2022                GLUCOSE                  104 (H)             12/26/2022                Musculoskeletal   Abdominal  (+) + obese (BMI 38.3)  Peds  Hematology Lab Results      Component                Value               Date                      WBC                      12.9 (H)            12/26/2022                HGB                      13.1                12/26/2022                HCT                      40.8                12/26/2022                MCV                      92.5                12/26/2022  PLT                      255                 12/26/2022              Anesthesia Other Findings   Reproductive/Obstetrics negative OB ROS                             Anesthesia Physical Anesthesia Plan  ASA: 2  Anesthesia Plan: General   Post-op Pain Management: Toradol IV (intra-op)* and Tylenol PO (pre-op)*   Induction:   PONV Risk Score and Plan: Treatment may vary due to age or medical condition, Midazolam, Ondansetron, Dexamethasone and Scopolamine patch -  Pre-op  Airway Management Planned: Oral ETT  Additional Equipment: None  Intra-op Plan:   Post-operative Plan: Extubation in OR  Informed Consent: I have reviewed the patients History and Physical, chart, labs and discussed the procedure including the risks, benefits and alternatives for the proposed anesthesia with the patient or authorized representative who has indicated his/her understanding and acceptance.     Dental advisory given  Plan Discussed with:   Anesthesia Plan Comments:         Anesthesia Quick Evaluation

## 2023-01-04 ENCOUNTER — Ambulatory Visit (HOSPITAL_COMMUNITY): Payer: No Typology Code available for payment source | Admitting: Anesthesiology

## 2023-01-04 ENCOUNTER — Ambulatory Visit (HOSPITAL_BASED_OUTPATIENT_CLINIC_OR_DEPARTMENT_OTHER): Payer: No Typology Code available for payment source | Admitting: Anesthesiology

## 2023-01-04 ENCOUNTER — Encounter (HOSPITAL_COMMUNITY): Payer: Self-pay | Admitting: General Surgery

## 2023-01-04 ENCOUNTER — Ambulatory Visit (HOSPITAL_COMMUNITY)
Admission: RE | Admit: 2023-01-04 | Discharge: 2023-01-04 | Disposition: A | Discharge status: Home / Self Care | Payer: No Typology Code available for payment source | Attending: General Surgery | Admitting: General Surgery

## 2023-01-04 ENCOUNTER — Encounter (HOSPITAL_COMMUNITY)
Admission: RE | Disposition: A | Discharge status: Home / Self Care | Payer: Self-pay | Source: Home / Self Care | Attending: General Surgery

## 2023-01-04 ENCOUNTER — Other Ambulatory Visit: Payer: Self-pay

## 2023-01-04 DIAGNOSIS — I10 Essential (primary) hypertension: Secondary | ICD-10-CM | POA: Diagnosis not present

## 2023-01-04 DIAGNOSIS — Z6838 Body mass index (BMI) 38.0-38.9, adult: Secondary | ICD-10-CM | POA: Diagnosis not present

## 2023-01-04 DIAGNOSIS — K8020 Calculus of gallbladder without cholecystitis without obstruction: Secondary | ICD-10-CM | POA: Diagnosis not present

## 2023-01-04 DIAGNOSIS — E669 Obesity, unspecified: Secondary | ICD-10-CM | POA: Insufficient documentation

## 2023-01-04 DIAGNOSIS — R519 Headache, unspecified: Secondary | ICD-10-CM | POA: Insufficient documentation

## 2023-01-04 DIAGNOSIS — K8010 Calculus of gallbladder with chronic cholecystitis without obstruction: Secondary | ICD-10-CM | POA: Diagnosis present

## 2023-01-04 DIAGNOSIS — K828 Other specified diseases of gallbladder: Secondary | ICD-10-CM | POA: Diagnosis not present

## 2023-01-04 HISTORY — PX: CHOLECYSTECTOMY: SHX55

## 2023-01-04 LAB — POCT PREGNANCY, URINE: Preg Test, Ur: NEGATIVE

## 2023-01-04 SURGERY — LAPAROSCOPIC CHOLECYSTECTOMY WITH INTRAOPERATIVE CHOLANGIOGRAM
Anesthesia: General

## 2023-01-04 MED ORDER — ROCURONIUM BROMIDE 10 MG/ML (PF) SYRINGE
PREFILLED_SYRINGE | INTRAVENOUS | Status: AC
  Filled 2023-01-04: qty 10

## 2023-01-04 MED ORDER — PROPOFOL 10 MG/ML IV BOLUS
INTRAVENOUS | Status: DC | PRN
  Administered 2023-01-04: 200 mg via INTRAVENOUS

## 2023-01-04 MED ORDER — SPY AGENT GREEN - (INDOCYANINE FOR INJECTION)
1.2500 mg | Freq: Once | INTRAMUSCULAR | Status: AC
  Administered 2023-01-04: 1.25 mg via INTRAVENOUS

## 2023-01-04 MED ORDER — KETOROLAC TROMETHAMINE 30 MG/ML IJ SOLN
INTRAMUSCULAR | Status: AC
  Filled 2023-01-04: qty 1

## 2023-01-04 MED ORDER — ONDANSETRON HCL 4 MG/2ML IJ SOLN
INTRAMUSCULAR | Status: DC | PRN
  Administered 2023-01-04: 4 mg via INTRAVENOUS

## 2023-01-04 MED ORDER — FENTANYL CITRATE (PF) 100 MCG/2ML IJ SOLN
INTRAMUSCULAR | Status: DC | PRN
  Administered 2023-01-04: 100 ug via INTRAVENOUS

## 2023-01-04 MED ORDER — PROPOFOL 10 MG/ML IV BOLUS
INTRAVENOUS | Status: AC
  Filled 2023-01-04: qty 20

## 2023-01-04 MED ORDER — SCOPOLAMINE 1 MG/3DAYS TD PT72
MEDICATED_PATCH | TRANSDERMAL | Status: AC
  Filled 2023-01-04: qty 1

## 2023-01-04 MED ORDER — HYDROMORPHONE HCL 1 MG/ML IJ SOLN
INTRAMUSCULAR | Status: AC
  Filled 2023-01-04: qty 1

## 2023-01-04 MED ORDER — MIDAZOLAM HCL 5 MG/5ML IJ SOLN
INTRAMUSCULAR | Status: DC | PRN
  Administered 2023-01-04: 2 mg via INTRAVENOUS

## 2023-01-04 MED ORDER — FENTANYL CITRATE (PF) 100 MCG/2ML IJ SOLN
INTRAMUSCULAR | Status: AC
  Filled 2023-01-04: qty 2

## 2023-01-04 MED ORDER — ORAL CARE MOUTH RINSE: 15.0000 mL | Freq: Once | OROMUCOSAL | Status: AC

## 2023-01-04 MED ORDER — KETOROLAC TROMETHAMINE 30 MG/ML IJ SOLN: 30.0000 mg | Freq: Once | INTRAMUSCULAR | Status: DC | PRN

## 2023-01-04 MED ORDER — HYDROMORPHONE HCL 1 MG/ML IJ SOLN
0.2500 mg | INTRAMUSCULAR | Status: DC | PRN
  Administered 2023-01-04: 0.5 mg via INTRAVENOUS

## 2023-01-04 MED ORDER — ACETAMINOPHEN 500 MG PO TABS: 1000.0000 mg | ORAL_TABLET | Freq: Three times a day (TID) | ORAL | Status: AC

## 2023-01-04 MED ORDER — ACETAMINOPHEN 500 MG PO TABS
1000.0000 mg | ORAL_TABLET | ORAL | Status: AC
  Administered 2023-01-04: 1000 mg via ORAL

## 2023-01-04 MED ORDER — 0.9 % SODIUM CHLORIDE (POUR BTL) OPTIME
TOPICAL | Status: DC | PRN
Start: 2023-01-04 — End: 2023-01-04
  Administered 2023-01-04: 1000 mL

## 2023-01-04 MED ORDER — ONDANSETRON HCL 4 MG/2ML IJ SOLN
INTRAMUSCULAR | Status: AC
  Filled 2023-01-04: qty 2

## 2023-01-04 MED ORDER — OXYCODONE HCL 5 MG PO TABS: 5.0000 mg | ORAL_TABLET | Freq: Four times a day (QID) | ORAL | 0 refills | Status: DC | PRN

## 2023-01-04 MED ORDER — MIDAZOLAM HCL 2 MG/2ML IJ SOLN
INTRAMUSCULAR | Status: AC
  Filled 2023-01-04: qty 2

## 2023-01-04 MED ORDER — SCOPOLAMINE 1 MG/3DAYS TD PT72
1.0000 | MEDICATED_PATCH | Freq: Once | TRANSDERMAL | Status: DC
  Administered 2023-01-04: 1.5 mg via TRANSDERMAL

## 2023-01-04 MED ORDER — SCOPOLAMINE 1 MG/3DAYS TD PT72: 1.0000 | MEDICATED_PATCH | TRANSDERMAL | Status: DC

## 2023-01-04 MED ORDER — ROCURONIUM BROMIDE 10 MG/ML (PF) SYRINGE
PREFILLED_SYRINGE | INTRAVENOUS | Status: DC | PRN
  Administered 2023-01-04: 60 mg via INTRAVENOUS

## 2023-01-04 MED ORDER — DEXAMETHASONE SODIUM PHOSPHATE 10 MG/ML IJ SOLN
INTRAMUSCULAR | Status: AC
  Filled 2023-01-04: qty 1

## 2023-01-04 MED ORDER — CHLORHEXIDINE GLUCONATE CLOTH 2 % EX PADS: 6.0000 | MEDICATED_PAD | Freq: Once | CUTANEOUS | Status: DC

## 2023-01-04 MED ORDER — LACTATED RINGERS IV SOLN: INTRAVENOUS | Status: DC

## 2023-01-04 MED ORDER — DEXAMETHASONE SODIUM PHOSPHATE 10 MG/ML IJ SOLN
INTRAMUSCULAR | Status: DC | PRN
  Administered 2023-01-04: 8 mg via INTRAVENOUS

## 2023-01-04 MED ORDER — CHLORHEXIDINE GLUCONATE 0.12 % MT SOLN
15.0000 mL | Freq: Once | OROMUCOSAL | Status: AC
  Administered 2023-01-04: 15 mL via OROMUCOSAL

## 2023-01-04 MED ORDER — OXYCODONE HCL 5 MG/5ML PO SOLN: 5.0000 mg | Freq: Once | ORAL | Status: AC | PRN

## 2023-01-04 MED ORDER — ACETAMINOPHEN 500 MG PO TABS
1000.0000 mg | ORAL_TABLET | Freq: Once | ORAL | Status: DC
  Filled 2023-01-04: qty 2

## 2023-01-04 MED ORDER — BUPIVACAINE-EPINEPHRINE 0.25% -1:200000 IJ SOLN
INTRAMUSCULAR | Status: AC
  Filled 2023-01-04: qty 1

## 2023-01-04 MED ORDER — ONDANSETRON HCL 4 MG/2ML IJ SOLN: 4.0000 mg | Freq: Once | INTRAMUSCULAR | Status: DC | PRN

## 2023-01-04 MED ORDER — LACTATED RINGERS IR SOLN
Status: DC | PRN
Start: 2023-01-04 — End: 2023-01-04
  Administered 2023-01-04: 1000 mL

## 2023-01-04 MED ORDER — LIDOCAINE 2% (20 MG/ML) 5 ML SYRINGE
INTRAMUSCULAR | Status: DC | PRN
  Administered 2023-01-04: 100 mg via INTRAVENOUS

## 2023-01-04 MED ORDER — OXYCODONE HCL 5 MG PO TABS
5.0000 mg | ORAL_TABLET | Freq: Once | ORAL | Status: AC | PRN
  Administered 2023-01-04: 5 mg via ORAL

## 2023-01-04 MED ORDER — SODIUM CHLORIDE 0.9 % IV SOLN
2.0000 g | INTRAVENOUS | Status: AC
  Administered 2023-01-04: 2 g via INTRAVENOUS
  Filled 2023-01-04: qty 2

## 2023-01-04 MED ORDER — PHENYLEPHRINE 80 MCG/ML (10ML) SYRINGE FOR IV PUSH (FOR BLOOD PRESSURE SUPPORT)
PREFILLED_SYRINGE | INTRAVENOUS | Status: AC
  Filled 2023-01-04: qty 10

## 2023-01-04 MED ORDER — PHENYLEPHRINE 80 MCG/ML (10ML) SYRINGE FOR IV PUSH (FOR BLOOD PRESSURE SUPPORT)
PREFILLED_SYRINGE | INTRAVENOUS | Status: DC | PRN
  Administered 2023-01-04 (×2): 80 ug via INTRAVENOUS

## 2023-01-04 MED ORDER — OXYCODONE HCL 5 MG PO TABS
ORAL_TABLET | ORAL | Status: AC
  Filled 2023-01-04: qty 1

## 2023-01-04 MED ORDER — BUPIVACAINE-EPINEPHRINE 0.25% -1:200000 IJ SOLN
INTRAMUSCULAR | Status: DC | PRN
  Administered 2023-01-04: 30 mL

## 2023-01-04 MED ORDER — KETOROLAC TROMETHAMINE 30 MG/ML IJ SOLN
INTRAMUSCULAR | Status: DC | PRN
Start: 2023-01-04 — End: 2023-01-04
  Administered 2023-01-04: 30 mg via INTRAVENOUS

## 2023-01-04 SURGICAL SUPPLY — 55 items
ADH SKN CLS APL DERMABOND .7 (GAUZE/BANDAGES/DRESSINGS)
APL PRP STRL LF DISP 70% ISPRP (MISCELLANEOUS) ×1
APL SRG 38 LTWT LNG FL B (MISCELLANEOUS)
APPLICATOR ARISTA FLEXITIP XL (MISCELLANEOUS) IMPLANT
APPLIER CLIP 5 13 M/L LIGAMAX5 (MISCELLANEOUS) ×1
APPLIER CLIP ROT 10 11.4 M/L (STAPLE)
APR CLP MED LRG 11.4X10 (STAPLE)
APR CLP MED LRG 5 ANG JAW (MISCELLANEOUS) ×1
BAG COUNTER SPONGE SURGICOUNT (BAG) IMPLANT
BAG SPEC RTRVL 10 TROC 200 (ENDOMECHANICALS) ×1
BAG SPNG CNTER NS LX DISP (BAG)
CABLE HIGH FREQUENCY MONO STRZ (ELECTRODE) ×2 IMPLANT
CHLORAPREP W/TINT 26 (MISCELLANEOUS) ×2 IMPLANT
CLIP APPLIE 5 13 M/L LIGAMAX5 (MISCELLANEOUS) IMPLANT
CLIP APPLIE ROT 10 11.4 M/L (STAPLE) IMPLANT
CLIP LIGATING HEMO O LOK GREEN (MISCELLANEOUS) IMPLANT
COVER MAYO STAND XLG (MISCELLANEOUS) IMPLANT
COVER SURGICAL LIGHT HANDLE (MISCELLANEOUS) ×2 IMPLANT
DERMABOND ADVANCED .7 DNX12 (GAUZE/BANDAGES/DRESSINGS) IMPLANT
DRAPE C-ARM 42X120 X-RAY (DRAPES) IMPLANT
DRSG TEGADERM 2-3/8X2-3/4 SM (GAUZE/BANDAGES/DRESSINGS) ×6 IMPLANT
DRSG TEGADERM 4X4.75 (GAUZE/BANDAGES/DRESSINGS) ×2 IMPLANT
ELECT REM PT RETURN 15FT ADLT (MISCELLANEOUS) ×2 IMPLANT
GAUZE SPONGE 2X2 8PLY STRL LF (GAUZE/BANDAGES/DRESSINGS) ×2 IMPLANT
GLOVE BIO SURGEON STRL SZ7.5 (GLOVE) ×2 IMPLANT
GLOVE INDICATOR 8.0 STRL GRN (GLOVE) ×2 IMPLANT
GOWN STRL REUS W/ TWL XL LVL3 (GOWN DISPOSABLE) ×2 IMPLANT
GOWN STRL REUS W/TWL XL LVL3 (GOWN DISPOSABLE) ×1
GRASPER SUT TROCAR 14GX15 (MISCELLANEOUS) IMPLANT
HEMOSTAT ARISTA ABSORB 3G PWDR (HEMOSTASIS) IMPLANT
HEMOSTAT SNOW SURGICEL 2X4 (HEMOSTASIS) IMPLANT
IRRIG SUCT STRYKERFLOW 2 WTIP (MISCELLANEOUS) ×1
IRRIGATION SUCT STRKRFLW 2 WTP (MISCELLANEOUS) ×2 IMPLANT
KIT BASIN OR (CUSTOM PROCEDURE TRAY) ×2 IMPLANT
KIT TURNOVER KIT A (KITS) IMPLANT
L-HOOK LAP DISP 36CM (ELECTROSURGICAL)
LHOOK LAP DISP 36CM (ELECTROSURGICAL) IMPLANT
POUCH RETRIEVAL ECOSAC 10 (ENDOMECHANICALS) ×2 IMPLANT
SCISSORS LAP 5X35 DISP (ENDOMECHANICALS) ×2 IMPLANT
SET CHOLANGIOGRAPH MIX (MISCELLANEOUS) IMPLANT
SET TUBE SMOKE EVAC HIGH FLOW (TUBING) ×2 IMPLANT
SLEEVE ADV FIXATION 5X100MM (TROCAR) ×2 IMPLANT
SPIKE FLUID TRANSFER (MISCELLANEOUS) ×2 IMPLANT
STRIP CLOSURE SKIN 1/2X4 (GAUZE/BANDAGES/DRESSINGS) ×2 IMPLANT
SUT MNCRL AB 4-0 PS2 18 (SUTURE) ×2 IMPLANT
SUT VIC AB 0 UR5 27 (SUTURE) IMPLANT
SUT VICRYL 0 TIES 12 18 (SUTURE) IMPLANT
SUT VICRYL 0 UR6 27IN ABS (SUTURE) IMPLANT
TOWEL OR 17X26 10 PK STRL BLUE (TOWEL DISPOSABLE) ×2 IMPLANT
TOWEL OR NON WOVEN STRL DISP B (DISPOSABLE) ×2 IMPLANT
TRAY LAPAROSCOPIC (CUSTOM PROCEDURE TRAY) ×2 IMPLANT
TROCAR ADV FIXATION 12X100MM (TROCAR) IMPLANT
TROCAR ADV FIXATION 5X100MM (TROCAR) ×2 IMPLANT
TROCAR BALLN 12MMX100 BLUNT (TROCAR) IMPLANT
TROCAR XCEL NON-BLD 5MMX100MML (ENDOMECHANICALS) IMPLANT

## 2023-01-04 NOTE — Op Note (Signed)
Kimberly Howard [id number] [birth year] 01/04/2023  Laparoscopic Cholecystectomy with near infrared fluorescent cholangiography procedure Note  Indications: This patient presents with symptomatic gallbladder disease and will undergo laparoscopic cholecystectomy.  Pre-operative Diagnosis: symptomatic cholelithiasis  Post-operative Diagnosis: Same  Surgeon: Gaynelle Adu MD FACS  Assistants: none  Anesthesia: General endotracheal anesthesia   Procedure Details  The patient was seen again in the Holding Room. The risks, benefits, complications, treatment options, and expected outcomes were discussed with the patient. The possibilities of reaction to medication, pulmonary aspiration, perforation of viscus, bleeding, recurrent infection, finding a normal gallbladder, the need for additional procedures, failure to diagnose a condition, the possible need to convert to an open procedure, and creating a complication requiring transfusion or operation were discussed with the patient. The likelihood of improving the patient's symptoms with return to their baseline status is good.  The patient and/or family concurred with the proposed plan, giving informed consent. The site of surgery properly noted. The patient was taken to Operating Room, identified as Kimberly Howard and the procedure verified as Laparoscopic Cholecystectomy with ICG dye.  A Time Out was held and the above information confirmed. Antibiotic prophylaxis was administered.    ICG dye was administered preoperatively.    General endotracheal anesthesia was then administered and tolerated well. After the induction, the abdomen was prepped with Chloraprep and draped in the sterile fashion. The patient was positioned in the supine position.  Local anesthetic agent was injected into the skin near the umbilicus and an incision made. We dissected down to the abdominal fascia with blunt dissection.  The fascia was incised vertically and we entered the  peritoneal cavity bluntly.  A pursestring suture of 0-Vicryl was placed around the fascial opening.  The Hasson cannula was inserted and secured with the stay suture.  Pneumoperitoneum was then created with CO2 and tolerated well without any adverse changes in the patient's vital signs. An 5-mm port was placed in the subxiphoid position.  Two 5-mm ports were placed in the right upper quadrant. All skin incisions were infiltrated with a local anesthetic agent before making the incision and placing the trocars.   We positioned the patient in reverse Trendelenburg, tilted slightly to the patient's left.  The gallbladder was identified, the fundus grasped and retracted cephalad. Adhesions were lysed bluntly and with the electrocautery where indicated, taking care not to injure any adjacent organs or viscus. The infundibulum was grasped and retracted laterally, exposing the peritoneum overlying the triangle of Calot. This was then divided and exposed in a blunt fashion. A critical view of the cystic duct and cystic artery was obtained.  The cystic duct was clearly identified and bluntly dissected circumferentially.  Utilizing the Stryker camera system near infrared fluorescent activity was visualized in the liver, cystic duct, common hepatic duct and common bile duct and duodenum.  This served as a secondary confirmation of our anatomy.  The cystic duct was then ligated with clips and divided. The cystic artery which had been identified & dissected free was ligated with clips and divided as well.   The gallbladder was dissected from the liver bed in retrograde fashion with the electrocautery.  There was some spillage of bile from the gallbladder as the posterior wall was entered but no stone spillage.  The gallbladder was removed and placed in an Ecco sac.  The gallbladder and Ecco sac were then removed through the umbilical port site. The liver bed was irrigated and inspected. Hemostasis was achieved with the  electrocautery.  Copious irrigation was utilized and was repeatedly aspirated until clear.  The pursestring suture was used to close the umbilical fascia.  Patient had a little bit of a diastases so I placed an additional interrupted 0 Vicryl using PMI suture passer with laparoscopic guidance.  We again inspected the right upper quadrant for hemostasis.  The umbilical closure was inspected and there was no air leak and nothing trapped within the closure. Pneumoperitoneum was released as we removed the trocars.  4-0 Monocryl was used to close the skin.   Dermabond was applied. The patient was then extubated and brought to the recovery room in stable condition. Instrument, sponge, and needle counts were correct at closure and at the conclusion of the case.   Findings: Cholelithiasis, Positive critical view  Estimated Blood Loss: Minimal         Drains: none         Specimens: Gallbladder           Complications: None; patient tolerated the procedure well.         Disposition: PACU - hemodynamically stable.         Condition: stable  [name], MD, FACS General, Bariatric, & Minimally Invasive Surgery [facility] Surgery,  [facility]

## 2023-01-04 NOTE — Transfer of Care (Signed)
Immediate Anesthesia Transfer of Care Note  Patient: Cylie Denys Guiles  Procedure(s) Performed: LAPAROSCOPIC CHOLECYSTECTOMY WITH ICG DYE  Patient Location: PACU  Anesthesia Type:General  Level of Consciousness: awake, alert , oriented, and patient cooperative  Airway & Oxygen Therapy: Patient Spontanous Breathing and Patient connected to face mask oxygen  Post-op Assessment: Report given to RN, Post -op Vital signs reviewed and stable, and Patient moving all extremities  Post vital signs: Reviewed and stable  Last Vitals:  Vitals Value Taken Time  BP 136/93 01/04/23 1330  Temp    Pulse 76 01/04/23 1331  Resp 15 01/04/23 1331  SpO2 99 % 01/04/23 1331  Vitals shown include unfiled device data.  Last Pain:  Vitals:   01/04/23 1022  TempSrc: Oral  PainSc: 0-No pain      Patients Stated Pain Goal: 4 (01/04/23 1022)  Complications: No notable events documented.

## 2023-01-04 NOTE — Anesthesia Procedure Notes (Signed)
Procedure Name: Intubation Date/Time: 01/04/2023 12:19 PM  Performed by: Elisabeth Cara, CRNAPre-anesthesia Checklist: Patient identified, Suction available, Emergency Drugs available, Patient being monitored and Timeout performed Patient Re-evaluated:Patient Re-evaluated prior to induction Oxygen Delivery Method: Circle system utilized Preoxygenation: Pre-oxygenation with 100% oxygen Induction Type: IV induction Ventilation: Oral airway inserted - appropriate to patient size and Mask ventilation without difficulty Laryngoscope Size: Mac and 4 Grade View: Grade I Tube type: Oral Tube size: 7.5 mm Number of attempts: 1 Airway Equipment and Method: Stylet Placement Confirmation: ETT inserted through vocal cords under direct vision, positive ETCO2 and breath sounds checked- equal and bilateral Secured at: 21 cm Tube secured with: Tape Dental Injury: Teeth and Oropharynx as per pre-operative assessment

## 2023-01-04 NOTE — Anesthesia Postprocedure Evaluation (Signed)
Anesthesia Post Note  Patient: Dajanique Bailly Urquiza  Procedure(s) Performed: LAPAROSCOPIC CHOLECYSTECTOMY WITH ICG DYE     Patient location during evaluation: PACU Anesthesia Type: General Level of consciousness: awake and alert, patient cooperative and oriented Pain management: pain level controlled Vital Signs Assessment: post-procedure vital signs reviewed and stable Respiratory status: spontaneous breathing, nonlabored ventilation and respiratory function stable Cardiovascular status: blood pressure returned to baseline and stable Postop Assessment: no apparent nausea or vomiting and adequate PO intake Anesthetic complications: no   No notable events documented.  Last Vitals:  Vitals:   01/04/23 1345 01/04/23 1400  BP: 136/87 133/83  Pulse: 80 72  Resp: (!) 23 18  Temp:  36.7 C  SpO2: 95% 97%    Last Pain:  Vitals:   01/04/23 1408  TempSrc:   PainSc: 4                  Narcissa Melder,E. Gerardo Caiazzo

## 2023-01-04 NOTE — Discharge Instructions (Signed)

## 2023-01-04 NOTE — H&P (Signed)
CC: here for surgery  Requesting provider: n/a  HPI: Kimberly Howard is an 42 y.o. female who is here for lap cholecystectomy with icg dye and poss cholangiogram Denies any changes since seen in clinic.   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, continued HTN   Migraines    Preeclampsia 05/28/2013   Status post cesarean delivery 12/07/2018    Past Surgical History:  Procedure Laterality Date   CESAREAN SECTION N/A 12/06/2018   Procedure: CESAREAN SECTION;  Surgeon: Edwinna Areola, DO;  Location: [facility] LD ORS;  Service: Obstetrics;  Laterality: N/A;   NASAL SEPTUM SURGERY  03/2003    Family History  Problem Relation Age of Onset   Hypertension Mother    Diabetes Mother    Diverticulitis Mother    Pancreatitis Mother    Gallstones Mother    Hyperlipidemia Father    Colon polyps Father    Asthma Sister    Asthma Sister    Hypertension Brother    Diabetes Maternal Grandmother    Hypertension Maternal Grandmother    Mental illness Maternal Grandmother        bipolar?/dementia   CVA Maternal Grandfather    Cancer Paternal Grandmother        ? lung, smoker, metastatic   Heart murmur Son    Other Son    Colon cancer Cousin 42       ? second cousin on maternal side   Rectal cancer Neg Hx     Social:  reports that she has never smoked. She has never used smokeless tobacco. She reports current alcohol use. She reports that she does not use drugs.  Allergies:  Allergies  Allergen Reactions   Adhesive [Tape]     Medications: I have reviewed the patient's current medications.   ROS - all of the below systems have been reviewed with the patient and positives are indicated with bold text General: chills, fever or night sweats Eyes: blurry vision or double vision ENT: epistaxis or sore  throat Allergy/Immunology: itchy/watery eyes or nasal congestion Hematologic/Lymphatic: bleeding problems, blood clots or swollen lymph nodes Endocrine: temperature intolerance or unexpected weight changes Breast: new or changing breast lumps or nipple discharge Resp: cough, shortness of breath, or wheezing CV: chest pain or dyspnea on exertion GI: as per HPI GU: dysuria, trouble voiding, or hematuria MSK: joint pain or joint stiffness Neuro: TIA or stroke symptoms Derm: pruritus and skin lesion changes Psych: anxiety and depression  PE Blood pressure 129/80, temperature 98.3 F (36.8 C), temperature source Oral, resp. rate 16, height 5\' 4"  (1.626 m), weight 101.5 kg, last menstrual period 12/09/2022, SpO2 97%. Constitutional: NAD; conversant; no deformities Eyes: Moist conjunctiva; no lid lag; anicteric; PERRL Neck: Trachea midline; no thyromegaly Lungs: Normal respiratory effort; no tactile fremitus CV: RRR; no palpable thrills; no pitting edema GI: Abd soft, nt nd, old c/s scar; no palpable hepatosplenomegaly MSK: Normal gait; no clubbing/cyanosis Psychiatric: Appropriate affect; alert and oriented x3 Lymphatic: No palpable cervical or axillary lymphadenopathy Skin:no rash/lesions  Results for orders placed or performed during the hospital encounter of 01/04/23 (from the past 48 hour(s))  Pregnancy, urine POC     Status: None   Collection Time: 01/04/23 10:00 AM  Result Value Ref Range   Preg Test, Ur NEGATIVE  NEGATIVE    Comment:        THE SENSITIVITY OF THIS METHODOLOGY IS >24 mIU/mL     No results found.  Imaging: reviewed  A/P: Kimberly Howard is an 42 y.o. female with symptomatic cholelithiasis  To OR for lap chole with icg dye, poss ioc Iv abx Eras All questions and answered  I believe the patient's symptoms are consistent with gallbladder disease.  We rediscussed gallbladder disease.   I rediscussed laparoscopic cholecystectomy with IOC in detail.  .   We discussed the risks and benefits of a laparoscopic cholecystectomy including, but not limited to bleeding, infection, injury to surrounding structures such as the intestine or liver, bile leak, retained gallstones, need to convert to an open procedure, prolonged diarrhea, blood clots such as  DVT, common bile duct injury, anesthesia risks, and possible need for additional procedures.  We discussed the typical post-operative recovery course. I explained that the likelihood of improvement of their symptoms is good.   [name], MD, FACS General, Bariatric, & Minimally Invasive Surgery [facility] Surgery [facility]

## 2023-01-05 ENCOUNTER — Encounter (HOSPITAL_COMMUNITY): Payer: Self-pay | Admitting: General Surgery

## 2023-01-05 LAB — SURGICAL PATHOLOGY

## 2023-02-28 ENCOUNTER — Other Ambulatory Visit: Payer: Self-pay | Admitting: Nurse Practitioner

## 2023-02-28 DIAGNOSIS — I10 Essential (primary) hypertension: Secondary | ICD-10-CM

## 2023-03-07 ENCOUNTER — Ambulatory Visit: Payer: No Typology Code available for payment source | Admitting: Nurse Practitioner

## 2023-05-14 ENCOUNTER — Encounter: Payer: No Typology Code available for payment source | Admitting: Nurse Practitioner

## 2023-05-17 ENCOUNTER — Other Ambulatory Visit: Payer: Self-pay | Admitting: Obstetrics and Gynecology

## 2023-05-17 DIAGNOSIS — Z1231 Encounter for screening mammogram for malignant neoplasm of breast: Secondary | ICD-10-CM

## 2023-05-31 ENCOUNTER — Ambulatory Visit
Admission: RE | Admit: 2023-05-31 | Discharge: 2023-05-31 | Disposition: A | Discharge status: Home / Self Care | Payer: BC Managed Care – PPO | Source: Ambulatory Visit | Attending: Obstetrics and Gynecology | Admitting: Obstetrics and Gynecology

## 2023-05-31 DIAGNOSIS — Z1231 Encounter for screening mammogram for malignant neoplasm of breast: Secondary | ICD-10-CM

## 2023-06-14 ENCOUNTER — Encounter: Payer: No Typology Code available for payment source | Admitting: Nurse Practitioner

## 2023-07-03 ENCOUNTER — Encounter: Payer: No Typology Code available for payment source | Admitting: Nurse Practitioner

## 2024-02-01 ENCOUNTER — Ambulatory Visit (INDEPENDENT_AMBULATORY_CARE_PROVIDER_SITE_OTHER): Admitting: Family Medicine

## 2024-02-01 ENCOUNTER — Encounter: Payer: Self-pay | Admitting: Family Medicine

## 2024-02-01 VITALS — BP 128/86 | HR 80 | Temp 98.5°F | Ht 64.0 in | Wt 224.0 lb

## 2024-02-01 DIAGNOSIS — I10 Essential (primary) hypertension: Secondary | ICD-10-CM

## 2024-02-01 DIAGNOSIS — Z7689 Persons encountering health services in other specified circumstances: Secondary | ICD-10-CM

## 2024-02-01 DIAGNOSIS — R7303 Prediabetes: Secondary | ICD-10-CM | POA: Diagnosis not present

## 2024-02-01 DIAGNOSIS — F341 Dysthymic disorder: Secondary | ICD-10-CM

## 2024-02-01 DIAGNOSIS — E66811 Obesity, class 1: Secondary | ICD-10-CM

## 2024-02-01 DIAGNOSIS — E781 Pure hyperglyceridemia: Secondary | ICD-10-CM

## 2024-02-01 DIAGNOSIS — Z23 Encounter for immunization: Secondary | ICD-10-CM | POA: Diagnosis not present

## 2024-02-01 DIAGNOSIS — F909 Attention-deficit hyperactivity disorder, unspecified type: Secondary | ICD-10-CM | POA: Diagnosis not present

## 2024-02-01 DIAGNOSIS — F900 Attention-deficit hyperactivity disorder, predominantly inattentive type: Secondary | ICD-10-CM

## 2024-02-01 DIAGNOSIS — F419 Anxiety disorder, unspecified: Secondary | ICD-10-CM

## 2024-02-01 DIAGNOSIS — G43809 Other migraine, not intractable, without status migrainosus: Secondary | ICD-10-CM | POA: Diagnosis not present

## 2024-02-01 DIAGNOSIS — E559 Vitamin D deficiency, unspecified: Secondary | ICD-10-CM

## 2024-02-01 MED ORDER — FLUOXETINE HCL 20 MG PO CAPS
20.0000 mg | ORAL_CAPSULE | Freq: Every day | ORAL | 1 refills | Status: DC
Start: 2024-02-01 — End: 2024-03-07

## 2024-02-01 NOTE — Progress Notes (Signed)
 New Patient Office Visit  Subjective   Patient ID: LARINA LIEURANCE, female    DOB: 1980-09-22  Age: 43 y.o. MRN: [id number]  CC:  Chief Complaint  Patient presents with   Establish Care    HPI REMAS SOBEL presents to establish care new provider.  Patients previous primary care: [facility] with Dr. Elsie Richards; Established with [facility] in Feb 2025 with Dr. Elveria Kaiser.   Specialist: Ruthellen SHIPPER Dr. Saturnino   ADHD: Chronic. Patient is prescribed Adderall XR 30mg  daily, but takes it PRN. Maybe twice a month.   Prediabetes: Patient is prescribed Metformin 500mg  daily, but usually it 2-3 times a week. Last A1c was 5.6 yesterday with GYN, who initially prescribed medication. Based on chart review, highest A1c was 5.9.   Migraines: Patient is prescribed Topiramate  100mg  at bedtime. Manges migraines, can tell a difference if not taking medication.  HTN: Chronic. Patient is prescribed Verapamil  240mg  daily. Only monitors BP unless having symptoms. Denies CP, SHOB, dizziness, lightheadedness, or lower extremity. Has headaches.   Patient is complaining of anxiety. Been previously on Zoloft , Effexor , Wellbutrin , Buspar , and Lexapro . She is a mom, with a spouse is working different hours where he can not help as much with children, and obtaining her masters degree. Patient reports she is more irritated and snippy than she prefers to be, mostly with the children.   Outpatient Encounter Medications as of 02/01/2024  Medication Sig   B Complex Vitamins (B COMPLEX PO) Take 1 tablet by mouth daily.   Cholecalciferol (VITAMIN D ) 125 MCG (5000 UT) CAPS Take 5,000 Units by mouth daily.   FLUoxetine (PROZAC) 20 MG capsule Take 1 capsule (20 mg total) by mouth daily.   ibuprofen  (ADVIL ) 200 MG tablet Take 400 mg by mouth every 6 (six) hours as needed for moderate pain.   metFORMIN (GLUCOPHAGE) 500 MG  tablet Take 500 mg by mouth daily with breakfast.   topiramate  (TOPAMAX ) 100 MG tablet Take 100 mg by mouth at bedtime.   verapamil  (CALAN -SR) 240 MG CR tablet TAKE 1 TABLET BY MOUTH AT BEDTIME   [DISCONTINUED] cyclobenzaprine  (FLEXERIL ) 5 MG tablet Take 1 tablet (5 mg total) by mouth 3 (three) times daily as needed for muscle spasms.   amphetamine -dextroamphetamine  (ADDERALL XR) 30 MG 24 hr capsule Take 1 capsule (30 mg total) by mouth daily as needed (extra focus).   [DISCONTINUED] amphetamine -dextroamphetamine  (ADDERALL XR) 30 MG 24 hr capsule Take 1 capsule (30 mg total) by mouth daily. (Patient taking differently: Take 30 mg by mouth daily as needed (extra focus).)   [DISCONTINUED] oxyCODONE  (OXY IR/ROXICODONE ) 5 MG immediate release tablet Take 1 tablet (5 mg total) by mouth every 6 (six) hours as needed for severe pain.   [DISCONTINUED] sertraline  (ZOLOFT ) 100 MG tablet Take one and 1/2 tablet daily (Patient not taking: Reported on 02/01/2024)   No facility-administered encounter medications on file as of 02/01/2024.    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, continued HTN   Migraines    Preeclampsia 05/28/2013   Status post cesarean delivery 12/07/2018   Vitamin D  deficiency     Past Surgical History:  Procedure Laterality Date   CESAREAN SECTION N/A 12/06/2018   Procedure: CESAREAN  SECTION;  Surgeon: Delana Ted Morrison, DO;  Location: [facility] LD ORS;  Service: Obstetrics;  Laterality: N/A;   CHOLECYSTECTOMY N/A 01/04/2023   Procedure: LAPAROSCOPIC CHOLECYSTECTOMY WITH ICG DYE;  Surgeon: Tanda Locus, MD;  Location: [facility] ORS;  Service: General;  Laterality: N/A;  ICG DYE AND IOC   NASAL SEPTUM SURGERY  03/2003    Family History  Problem Relation Age of Onset   Hypertension Mother    Diverticulitis  Mother    Pancreatitis Mother    Gallstones Mother    Cancer Father        Liver   Hyperlipidemia Father    Colon polyps Father    Asthma Sister    Asthma Sister    Cancer Brother        Myeloma   Hypertension Brother    Heart murmur Son    Other Son        Salomon Finder Malformation   Diabetes Maternal Grandmother    Hypertension Maternal Grandmother    Mental illness Maternal Grandmother        bipolar?/dementia   CVA Maternal Grandfather    Cancer Paternal Grandmother        ? lung, smoker, metastatic   Colon cancer Cousin 58       ? second cousin on maternal side   Rectal cancer Neg Hx     Social History   Socioeconomic History   Marital status: Married    Spouse name: Not on file   Number of children: 2   Years of education: Not on file   Highest education level: Bachelor's degree (e.g., BA, AB, BS)  Occupational History   Occupation: [employer]  Tobacco Use   Smoking status: Never   Smokeless tobacco: Never  Vaping Use   Vaping status: Never Used  Substance and Sexual Activity   Alcohol use: Yes    Alcohol/week: 0.0 - 1.0 standard drinks of alcohol    Comment: 1 glass/month   Drug use: No   Sexual activity: Not Currently    Partners: Male  Other Topics Concern   Not on file  Social History Narrative   Not on file   Social Drivers of Health   Financial Resource Strain: Low Risk  (02/01/2024)   Overall Financial Resource Strain (CARDIA)    Difficulty of Paying Living Expenses: Not hard at all  Food Insecurity: Low Risk  (06/19/2023)   Received from [facility]   Hunger Vital Sign    Within the past 12 months, you worried that your food would run out before you got money to buy more: Never true    Within the past 12 months, the food you bought just didn't last and you didn't have money to get more. : Never true  Transportation Needs: No Transportation Needs (06/19/2023)   Received from [facility]   Transportation    In the past 12 months, has  lack of reliable transportation kept you from medical appointments, meetings, work or from getting things needed for daily living? : No  Physical Activity: Insufficiently Active (02/01/2024)   Exercise Vital Sign    Days of Exercise per Week: 2 days    Minutes of Exercise per Session: 30 min  Stress: Stress Concern Present (02/01/2024)   [employer] of Occupational Health - Occupational Stress Questionnaire    Feeling of Stress: Rather much  Social Connections: Moderately Integrated (02/01/2024)   Social Connection and Isolation Panel    Frequency of Communication with Friends and Family:  More than three times a week    Frequency of Social Gatherings with Friends and Family: Twice a week    Attends Religious Services: More than 4 times per year    Active Member of [employer] or Organizations: No    Attends Banker Meetings: Never    Marital Status: Married  Catering manager Violence: Not At Risk (02/01/2024)   Humiliation, Afraid, Rape, and Kick questionnaire    Fear of Current or Ex-Partner: No    Emotionally Abused: No    Physically Abused: No    Sexually Abused: No    ROS See HPI above    Objective  BP 128/86   Pulse 80   Temp 98.5 F (36.9 C) (Oral)   Ht 5' 4 (1.626 m)   Wt 224 lb (101.6 kg)   SpO2 98%   BMI 38.45 kg/m   Physical Exam Vitals reviewed.  Constitutional:      General: She is not in acute distress.    Appearance: Normal appearance. She is obese. She is not ill-appearing, toxic-appearing or diaphoretic.  HENT:     Head: Normocephalic and atraumatic.  Eyes:     General:        Right eye: No discharge.        Left eye: No discharge.     Conjunctiva/sclera: Conjunctivae normal.  Cardiovascular:     Rate and Rhythm: Normal rate and regular rhythm.     Heart sounds: Normal heart sounds. No murmur heard.    No friction rub. No gallop.  Pulmonary:     Effort: Pulmonary effort is normal. No respiratory distress.     Breath sounds: Normal  breath sounds.  Musculoskeletal:        General: Normal range of motion.  Skin:    General: Skin is warm and dry.  Neurological:     General: No focal deficit present.     Mental Status: She is alert and oriented to person, place, and time. Mental status is at baseline.  Psychiatric:        Mood and Affect: Mood normal.        Behavior: Behavior normal.        Thought Content: Thought content normal.        Judgment: Judgment normal.      Assessment & Plan:  Attention deficit hyperactivity disorder (ADHD), unspecified ADHD type Assessment & Plan: Stable. Continue Adderall XR 30mg  daily. -Ordered urine drug screen for management of Adderall. This will need to be collected periodically. Signed controlled substance contract for Adderall and will need to be renewed yearly.Reviewed PDMP, last  refill was on 08/27/2023. Refilled Adderall for ADHD.  Orders: -     DRUG MONITOR, PANEL 1, SCREEN, URINE -     Amphetamine -Dextroamphet ER; Take 1 capsule (30 mg total) by mouth daily as needed (extra focus).  Dispense: 30 capsule; Refill: 0  Prediabetes Assessment & Plan: Continue Metformin 500mg  daily. Previously been managed by GYN.    Other migraine without status migrainosus, not intractable Assessment & Plan: Stable. Continue Topiramate  100mg  at bedtime.    Essential hypertension Assessment & Plan: Stable. Continue Verapamil  240mg  daily. Ordered CMP.   Orders: -     Comprehensive metabolic panel with GFR; Future  Anxiety Assessment & Plan: Uncontrolled. Patient scored 14 on PHQ-9 and 19 on GAD-7. Through shared decision making, decided to try Fluoxetine 20mg  daily. Discussed about side effects of medication and when to take medication. Follow up in 1 month to  see if medication is beneficial.   Orders: -     FLUoxetine HCl; Take 1 capsule (20 mg total) by mouth daily.  Dispense: 30 capsule; Refill: 1  Persistent depressive disorder Assessment & Plan: Uncontrolled. Patient  scored 14 on PHQ-9 and 19 on GAD-7. Through shared decision making, decided to try Fluoxetine 20mg  daily. Discussed about side effects of medication and when to take medication. Follow up in 1 month to see if medication is beneficial.    Obesity (BMI 30.0-34.9) Assessment & Plan: Ordered CMP, Lipid panel, and Vitamin D -not fasting. Making a lab appointment and be fasting for labs.   Orders: -     Comprehensive metabolic panel with GFR; Future -     Lipid panel; Future -     VITAMIN D  25 Hydroxy (Vit-D Deficiency, Fractures); Future  Vitamin D  deficiency -     VITAMIN D  25 Hydroxy (Vit-D Deficiency, Fractures); Future  Hypertriglyceridemia -     Comprehensive metabolic panel with GFR; Future -     Lipid panel; Future  Immunization due -     Flu vaccine trivalent PF, 6mos and older(Flulaval,Afluria,Fluarix,Fluzone)  Encounter to establish care  Other orders -     DM TEMPLATE  1.Review health maintenance: -Covid booster: Declines  -Hep B vaccine: Unknown  -Influenza vaccine: Administer 2.Patient has her latest TSH on her phone from GYN TSH was 1.79 and Free T4 was 0.02-stable.  Return in about 1 month (around 03/03/2024) for follow-up; lab when fasting .   Peola Joynt, NP

## 2024-02-02 LAB — DRUG MONITOR, PANEL 1, SCREEN, URINE
Amphetamines: NEGATIVE ng/mL (ref <500)
Barbiturates: NEGATIVE ng/mL (ref <300)
Benzodiazepines: NEGATIVE ng/mL (ref <100)
Cocaine Metabolite: NEGATIVE ng/mL (ref <150)
Creatinine: 139.6 mg/dL (ref >=20.0)
Marijuana Metabolite: NEGATIVE ng/mL (ref <20)
Methadone Metabolite: NEGATIVE ng/mL (ref <100)
Opiates: NEGATIVE ng/mL (ref <100)
Oxidant: NEGATIVE ug/mL (ref <200)
Oxycodone: NEGATIVE ng/mL (ref <100)
Phencyclidine: NEGATIVE ng/mL (ref <25)
pH: 6.4 (ref 4.5–9.0)

## 2024-02-02 LAB — DM TEMPLATE

## 2024-02-04 ENCOUNTER — Ambulatory Visit: Payer: Self-pay | Admitting: Family Medicine

## 2024-02-04 DIAGNOSIS — F909 Attention-deficit hyperactivity disorder, unspecified type: Secondary | ICD-10-CM | POA: Insufficient documentation

## 2024-02-04 DIAGNOSIS — R7303 Prediabetes: Secondary | ICD-10-CM | POA: Insufficient documentation

## 2024-02-04 DIAGNOSIS — F419 Anxiety disorder, unspecified: Secondary | ICD-10-CM | POA: Insufficient documentation

## 2024-02-04 MED ORDER — AMPHETAMINE-DEXTROAMPHET ER 30 MG PO CP24: 30.0000 mg | ORAL_CAPSULE | Freq: Every day | ORAL | 0 refills | Status: AC | PRN

## 2024-02-04 NOTE — Assessment & Plan Note (Addendum)
 Continue Metformin 500mg  daily. Previously been managed by GYN.

## 2024-02-04 NOTE — Assessment & Plan Note (Signed)
 Uncontrolled. Patient scored 14 on PHQ-9 and 19 on GAD-7. Through shared decision making, decided to try Fluoxetine 20mg  daily. Discussed about side effects of medication and when to take medication. Follow up in 1 month to see if medication is beneficial.

## 2024-02-04 NOTE — Assessment & Plan Note (Addendum)
 Stable. Continue Adderall XR 30mg  daily. -Ordered urine drug screen for management of Adderall. This will need to be collected periodically. Signed controlled substance contract for Adderall and will need to be renewed yearly.Reviewed PDMP, last  refill was on 08/27/2023. Refilled Adderall for ADHD.

## 2024-02-04 NOTE — Assessment & Plan Note (Signed)
 Ordered CMP, Lipid panel, and Vitamin D -not fasting. Making a lab appointment and be fasting for labs.

## 2024-02-04 NOTE — Patient Instructions (Addendum)
-  It was a pleasure to meet you and look forward to taking care of you.  -Ordered urine drug screen for management of Adderall. This will need to be collected periodically. Signed controlled substance contract for Adderall and will need to be renewed yearly. -Refilled Adderall for ADHD. -Continue all other medications.  -Discussed about anxiety and depression. Through shared decision making, decided to try Fluoxetine 20mg  daily. Discussed about side effects of medication and when to take medication. Follow up in 1 month to see if medication is beneficial.  -Ordered labs (CMP, Lipid panel, and Vitamin D ). Schedule lab appointment when able to fast. Office will call with lab results and will be available via MyChart.  Influenza vaccine given.  -Follow up in 1 month.

## 2024-02-04 NOTE — Assessment & Plan Note (Signed)
 Stable. Continue Verapamil  240mg  daily. Ordered CMP.

## 2024-02-04 NOTE — Assessment & Plan Note (Signed)
 Stable. Continue Topiramate  100mg  at bedtime.

## 2024-02-08 ENCOUNTER — Other Ambulatory Visit (INDEPENDENT_AMBULATORY_CARE_PROVIDER_SITE_OTHER)

## 2024-02-08 DIAGNOSIS — E781 Pure hyperglyceridemia: Secondary | ICD-10-CM | POA: Diagnosis not present

## 2024-02-08 DIAGNOSIS — I10 Essential (primary) hypertension: Secondary | ICD-10-CM

## 2024-02-08 DIAGNOSIS — E66811 Obesity, class 1: Secondary | ICD-10-CM

## 2024-02-08 DIAGNOSIS — E559 Vitamin D deficiency, unspecified: Secondary | ICD-10-CM | POA: Diagnosis not present

## 2024-02-08 LAB — COMPREHENSIVE METABOLIC PANEL WITH GFR
ALT: 15 U/L (ref 0–35)
AST: 15 U/L (ref 0–37)
Albumin: 4.4 g/dL (ref 3.5–5.2)
Alkaline Phosphatase: 43 U/L (ref 39–117)
BUN: 11 mg/dL (ref 6–23)
CO2: 21 meq/L (ref 19–32)
Calcium: 8.9 mg/dL (ref 8.4–10.5)
Chloride: 108 meq/L (ref 96–112)
Creatinine, Ser: 0.75 mg/dL (ref 0.40–1.20)
GFR: 99.07 mL/min (ref >60.00)
Glucose, Bld: 99 mg/dL (ref 70–99)
Potassium: 3.8 meq/L (ref 3.5–5.1)
Sodium: 138 meq/L (ref 135–145)
Total Bilirubin: 0.4 mg/dL (ref 0.2–1.2)
Total Protein: 7.9 g/dL (ref 6.0–8.3)

## 2024-02-08 LAB — LIPID PANEL
Cholesterol: 179 mg/dL (ref 0–200)
HDL: 45.8 mg/dL (ref >39.00)
LDL Cholesterol: 116 mg/dL — ABNORMAL HIGH (ref 0–99)
NonHDL: 133.11
Total CHOL/HDL Ratio: 4
Triglycerides: 84 mg/dL (ref 0.0–149.0)
VLDL: 16.8 mg/dL (ref 0.0–40.0)

## 2024-02-08 LAB — VITAMIN D 25 HYDROXY (VIT D DEFICIENCY, FRACTURES): VITD: 27.81 ng/mL — ABNORMAL LOW (ref 30.00–100.00)

## 2024-02-24 IMAGING — DX DG ABDOMEN 1V
2 series · 2 of 2 positions shown · non-contrast
Comparison: Ultrasound abdomen report 04/01/2019.

CLINICAL DATA: Diarrhea.  Evaluate stool burden.

EXAM:
ABDOMEN - 1 VIEW

[abdomen kub (1 of 2)]
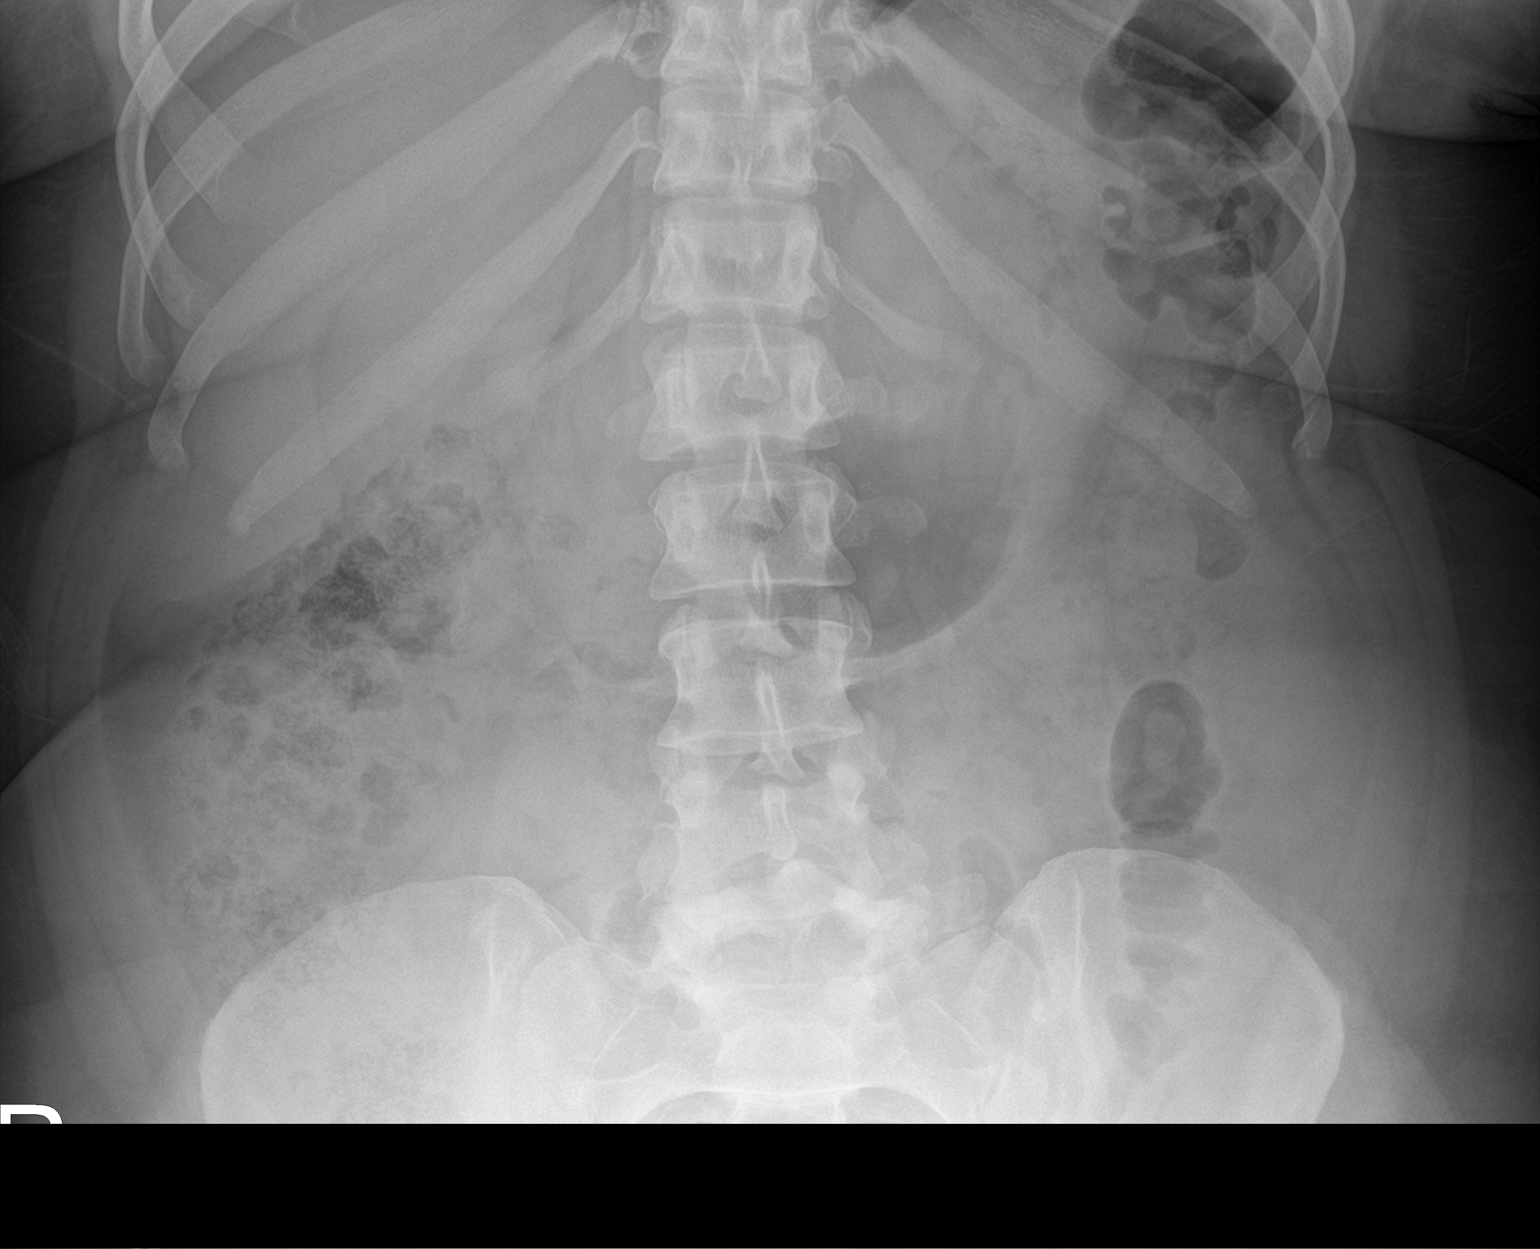

[abdomen kub (2 of 2)]
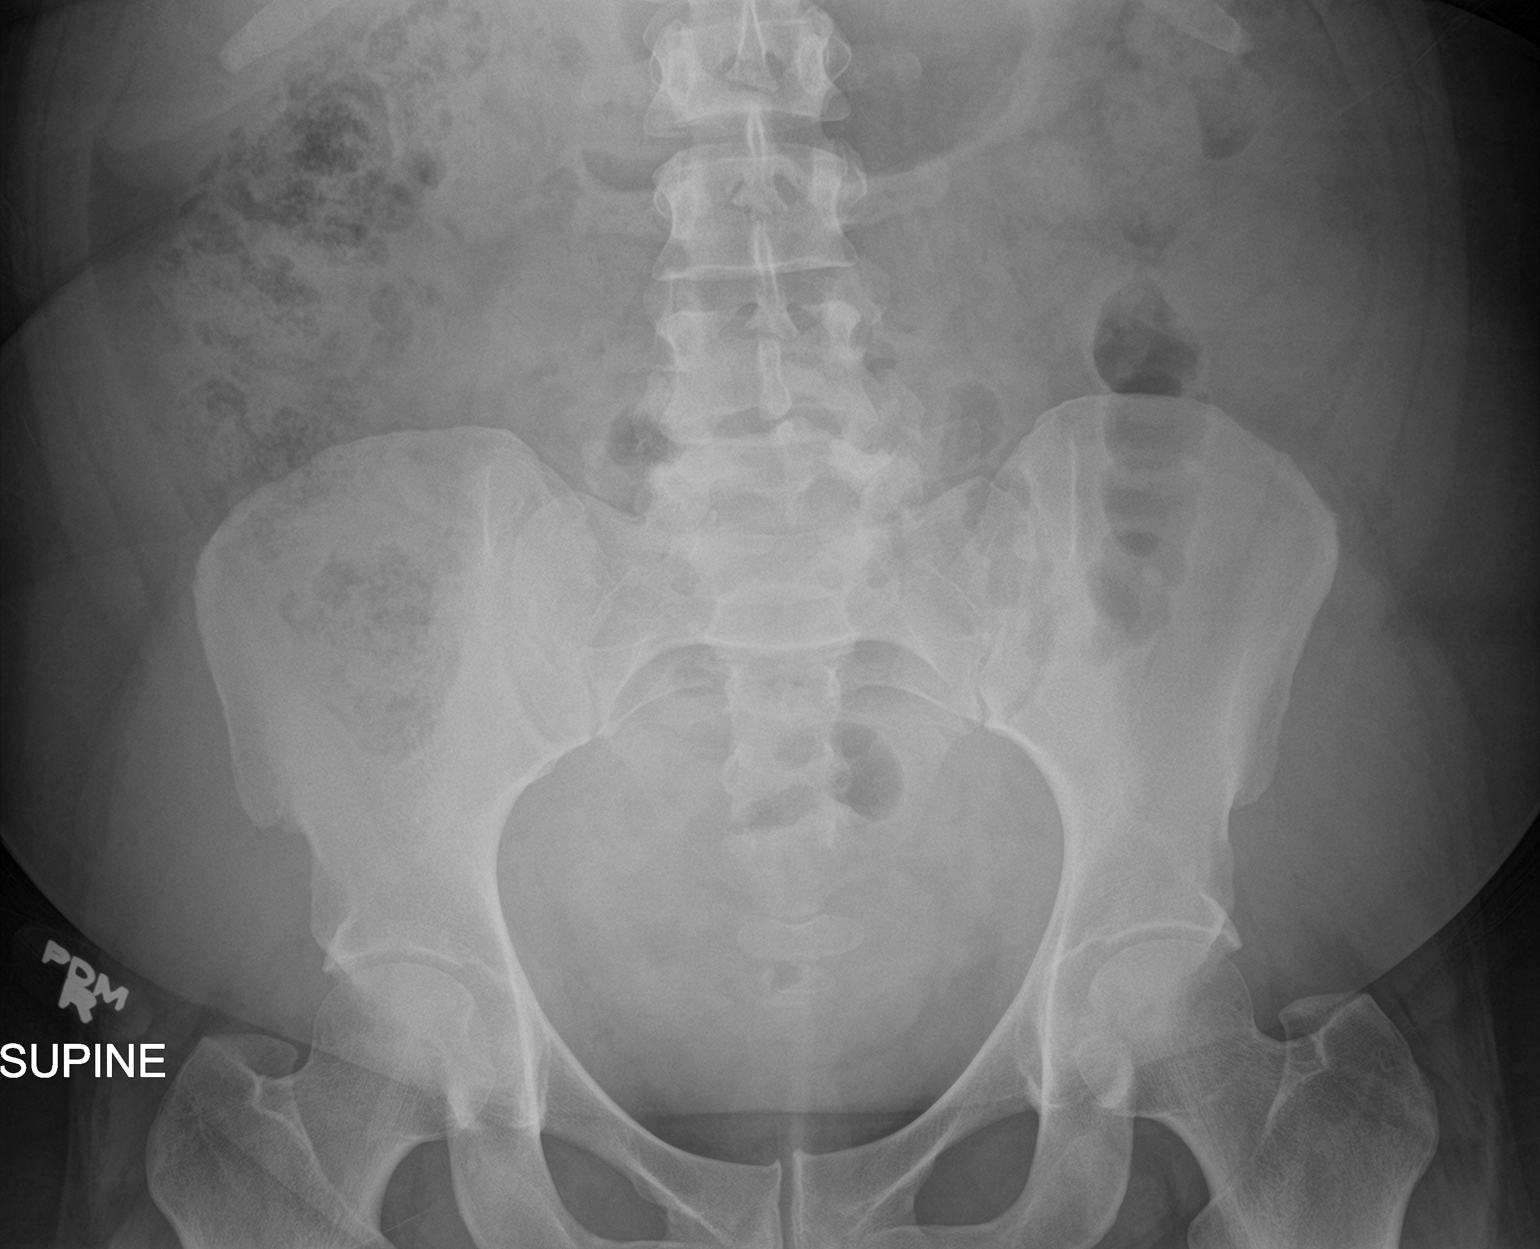

[2 of 2 positions shown; findings below may reference images not displayed]

FINDINGS: Soft tissue structures are unremarkable. Large amount of stool noted
throughout the right and transverse colon. Constipation could
present this fashion. No bowel distention. Hemidiaphragms not
completely imaged. No free air identified. No acute bony abnormality
identified.
IMPRESSION: Large amount of stool noted throughout the right and transverse
colon. Constipation cannot be excluded. No bowel distention.

## 2024-03-07 ENCOUNTER — Encounter: Payer: Self-pay | Admitting: Family Medicine

## 2024-03-07 ENCOUNTER — Ambulatory Visit (INDEPENDENT_AMBULATORY_CARE_PROVIDER_SITE_OTHER): Admitting: Family Medicine

## 2024-03-07 VITALS — BP 136/88 | HR 85 | Temp 98.3°F | Ht 64.0 in | Wt 220.0 lb

## 2024-03-07 DIAGNOSIS — F419 Anxiety disorder, unspecified: Secondary | ICD-10-CM | POA: Diagnosis not present

## 2024-03-07 DIAGNOSIS — F341 Dysthymic disorder: Secondary | ICD-10-CM | POA: Diagnosis not present

## 2024-03-07 MED ORDER — FLUOXETINE HCL 40 MG PO CAPS: 40.0000 mg | ORAL_CAPSULE | Freq: Every day | ORAL | 1 refills | Status: AC

## 2024-03-07 NOTE — Progress Notes (Signed)
   Established Patient Office Visit   Subjective:  Patient ID: Kimberly Howard, female    DOB: 10-26-1980  Age: 43 y.o. MRN: [id number]  Chief Complaint  Patient presents with   Medical Management of Chronic Issues    1 month follow up     HPI Anxiety and Depression: On previous appointment, started patient on Fluoxetine 20mg  daily. She feels like it has overall helped take the edge off, but thinks there is room improvement. Not SI or HI.  ROS See HPI above     Objective:   BP 136/88   Pulse 85   Temp 98.3 F (36.8 C) (Oral)   Ht 5' 4 (1.626 m)   Wt 220 lb (99.8 kg)   SpO2 99%   BMI 37.76 kg/m    Physical Exam Vitals reviewed.  Constitutional:      General: She is not in acute distress.    Appearance: Normal appearance. She is obese. She is not ill-appearing, toxic-appearing or diaphoretic.  HENT:     Head: Normocephalic and atraumatic.  Eyes:     General:        Right eye: No discharge.        Left eye: No discharge.     Conjunctiva/sclera: Conjunctivae normal.  Cardiovascular:     Rate and Rhythm: Normal rate.  Pulmonary:     Effort: Pulmonary effort is normal. No respiratory distress.  Musculoskeletal:        General: Normal range of motion.  Skin:    General: Skin is warm and dry.  Neurological:     General: No focal deficit present.     Mental Status: She is alert and oriented to person, place, and time. Mental status is at baseline.     Motor: No weakness.     Gait: Gait normal.  Psychiatric:        Mood and Affect: Mood normal.        Behavior: Behavior normal.        Thought Content: Thought content normal.        Judgment: Judgment normal.      Assessment & Plan:  Anxiety Assessment & Plan: Improved. Scored 6 on PHQ-9 from 14 and 7 on GAD-7 from 19. Increased Fluoxetine to 40mg  daily. May take (2) 20mg  tablets to equal 40mg  daily. Increased to see if this helps a little more.   Orders: -     FLUoxetine HCl; Take 1 capsule (40 mg total) by  mouth daily.  Dispense: 90 capsule; Refill: 1  Persistent depressive disorder Assessment & Plan: Improved. Scored 6 on PHQ-9 from 14 and 7 on GAD-7 from 19. Increased Fluoxetine to 40mg  daily. May take (2) 20mg  tablets to equal 40mg  daily. Increased to see if this helps a little more.   Orders: -     FLUoxetine HCl; Take 1 capsule (40 mg total) by mouth daily.  Dispense: 90 capsule; Refill: 1   Return in about 3 months (around 06/07/2024) for physical.   Jerrika Ledlow, NP

## 2024-03-07 NOTE — Assessment & Plan Note (Signed)
 Improved. Scored 6 on PHQ-9 from 14 and 7 on GAD-7 from 19. Increased Fluoxetine to 40mg  daily. May take (2) 20mg  tablets to equal 40mg  daily. Increased to see if this helps a little more.

## 2024-03-07 NOTE — Patient Instructions (Signed)
-  It was good to see you. -Increased Fluoxetine to 40mg  daily. May take (2) 20mg  tablets to equal 40mg  daily. Increased to see if this helps a little more.  -Follow up in 3 months for a physical.

## 2024-04-23 ENCOUNTER — Encounter: Payer: Self-pay | Admitting: Family Medicine

## 2024-04-25 ENCOUNTER — Other Ambulatory Visit: Payer: Self-pay

## 2024-04-25 DIAGNOSIS — I10 Essential (primary) hypertension: Secondary | ICD-10-CM

## 2024-04-25 MED ORDER — VERAPAMIL HCL ER 240 MG PO TBCR: 240.0000 mg | EXTENDED_RELEASE_TABLET | Freq: Every day | ORAL | 1 refills | Status: AC

## 2024-04-25 MED ORDER — TOPIRAMATE 100 MG PO TABS: 100.0000 mg | ORAL_TABLET | Freq: Every day | ORAL | 1 refills | Status: AC

## 2024-06-13 ENCOUNTER — Encounter: Admitting: Family Medicine
